# Patient Record
Sex: Female | Born: 1955
Health system: Southern US, Community
[De-identification: ages and names within clinical notes are randomized; demographics above are authoritative.]

## PROBLEM LIST (undated history)

## (undated) DIAGNOSIS — G4730 Sleep apnea, unspecified: Secondary | ICD-10-CM

## (undated) DIAGNOSIS — E240 Pituitary-dependent Cushing's disease: Secondary | ICD-10-CM

## (undated) DIAGNOSIS — I10 Essential (primary) hypertension: Secondary | ICD-10-CM

## (undated) DIAGNOSIS — M1990 Unspecified osteoarthritis, unspecified site: Secondary | ICD-10-CM

## (undated) DIAGNOSIS — M810 Age-related osteoporosis without current pathological fracture: Secondary | ICD-10-CM

## (undated) DIAGNOSIS — E119 Type 2 diabetes mellitus without complications: Secondary | ICD-10-CM

## (undated) DIAGNOSIS — E249 Cushing's syndrome, unspecified: Secondary | ICD-10-CM

## (undated) DIAGNOSIS — D649 Anemia, unspecified: Secondary | ICD-10-CM

## (undated) DIAGNOSIS — E781 Pure hyperglyceridemia: Secondary | ICD-10-CM

## (undated) HISTORY — DX: Essential (primary) hypertension: I10

## (undated) HISTORY — PX: TUBAL LIGATION: SHX77

## (undated) HISTORY — PX: PITUITARY EXCISION: SHX745

## (undated) HISTORY — DX: Type 2 diabetes mellitus without complications: E11.9

## (undated) HISTORY — DX: Sleep apnea, unspecified: G47.30

## (undated) HISTORY — DX: Pure hyperglyceridemia: E78.1

---

## 2001-12-23 ENCOUNTER — Other Ambulatory Visit: Admission: RE | Admit: 2001-12-23 | Discharge: 2001-12-23 | Payer: Self-pay | Admitting: Obstetrics & Gynecology

## 2002-01-29 ENCOUNTER — Ambulatory Visit (HOSPITAL_COMMUNITY): Admission: RE | Admit: 2002-01-29 | Discharge: 2002-01-29 | Payer: Self-pay | Admitting: Obstetrics & Gynecology

## 2003-06-01 ENCOUNTER — Encounter: Payer: Self-pay | Admitting: Family Medicine

## 2003-06-01 ENCOUNTER — Ambulatory Visit (HOSPITAL_COMMUNITY): Admission: RE | Admit: 2003-06-01 | Discharge: 2003-06-01 | Payer: Self-pay | Admitting: Family Medicine

## 2005-01-19 ENCOUNTER — Ambulatory Visit (HOSPITAL_COMMUNITY): Admission: RE | Admit: 2005-01-19 | Discharge: 2005-01-19 | Payer: Self-pay | Admitting: Otolaryngology

## 2005-09-24 DIAGNOSIS — G4730 Sleep apnea, unspecified: Secondary | ICD-10-CM

## 2005-09-24 HISTORY — DX: Sleep apnea, unspecified: G47.30

## 2005-10-25 ENCOUNTER — Ambulatory Visit (HOSPITAL_COMMUNITY): Admission: RE | Admit: 2005-10-25 | Discharge: 2005-10-25 | Payer: Self-pay | Admitting: *Deleted

## 2006-09-24 DIAGNOSIS — E119 Type 2 diabetes mellitus without complications: Secondary | ICD-10-CM

## 2006-09-24 HISTORY — DX: Type 2 diabetes mellitus without complications: E11.9

## 2007-06-18 ENCOUNTER — Ambulatory Visit: Payer: Self-pay | Admitting: Orthopedic Surgery

## 2007-07-02 ENCOUNTER — Encounter (HOSPITAL_COMMUNITY): Admission: RE | Admit: 2007-07-02 | Discharge: 2007-08-01 | Payer: Self-pay | Admitting: Family Medicine

## 2007-07-02 ENCOUNTER — Ambulatory Visit (HOSPITAL_COMMUNITY): Payer: Self-pay | Admitting: Family Medicine

## 2007-09-22 ENCOUNTER — Ambulatory Visit (HOSPITAL_COMMUNITY): Admission: RE | Admit: 2007-09-22 | Discharge: 2007-09-22 | Payer: Self-pay | Admitting: Family Medicine

## 2007-09-23 ENCOUNTER — Encounter (HOSPITAL_COMMUNITY): Admission: RE | Admit: 2007-09-23 | Discharge: 2007-09-24 | Payer: Self-pay | Admitting: Family Medicine

## 2007-09-26 ENCOUNTER — Encounter (HOSPITAL_COMMUNITY): Admission: RE | Admit: 2007-09-26 | Discharge: 2007-10-26 | Payer: Self-pay | Admitting: Family Medicine

## 2007-10-01 ENCOUNTER — Encounter (HOSPITAL_COMMUNITY): Admission: RE | Admit: 2007-10-01 | Discharge: 2007-10-31 | Payer: Self-pay | Admitting: Oncology

## 2007-10-01 ENCOUNTER — Ambulatory Visit (HOSPITAL_COMMUNITY): Payer: Self-pay | Admitting: Family Medicine

## 2007-10-27 ENCOUNTER — Encounter (HOSPITAL_COMMUNITY): Admission: RE | Admit: 2007-10-27 | Discharge: 2007-11-26 | Payer: Self-pay | Admitting: Family Medicine

## 2007-11-28 ENCOUNTER — Encounter (HOSPITAL_COMMUNITY): Admission: RE | Admit: 2007-11-28 | Discharge: 2007-12-28 | Payer: Self-pay | Admitting: Family Medicine

## 2007-12-14 ENCOUNTER — Encounter: Admission: RE | Admit: 2007-12-14 | Discharge: 2007-12-14 | Payer: Self-pay | Admitting: Endocrinology

## 2007-12-30 ENCOUNTER — Encounter (HOSPITAL_COMMUNITY): Admission: RE | Admit: 2007-12-30 | Discharge: 2008-01-29 | Payer: Self-pay | Admitting: Family Medicine

## 2007-12-31 ENCOUNTER — Ambulatory Visit (HOSPITAL_COMMUNITY): Payer: Self-pay | Admitting: Internal Medicine

## 2007-12-31 ENCOUNTER — Encounter (HOSPITAL_COMMUNITY): Admission: RE | Admit: 2007-12-31 | Discharge: 2008-01-30 | Payer: Self-pay | Admitting: Internal Medicine

## 2008-02-26 ENCOUNTER — Inpatient Hospital Stay (HOSPITAL_COMMUNITY): Admission: RE | Admit: 2008-02-26 | Discharge: 2008-03-01 | Payer: Self-pay | Admitting: Neurosurgery

## 2008-02-26 ENCOUNTER — Encounter (INDEPENDENT_AMBULATORY_CARE_PROVIDER_SITE_OTHER): Payer: Self-pay | Admitting: Neurosurgery

## 2008-04-01 ENCOUNTER — Ambulatory Visit (HOSPITAL_COMMUNITY): Payer: Self-pay | Admitting: Internal Medicine

## 2008-04-01 ENCOUNTER — Encounter (HOSPITAL_COMMUNITY): Admission: RE | Admit: 2008-04-01 | Discharge: 2008-05-01 | Payer: Self-pay | Admitting: Internal Medicine

## 2008-06-14 ENCOUNTER — Other Ambulatory Visit: Admission: RE | Admit: 2008-06-14 | Discharge: 2008-06-14 | Payer: Self-pay | Admitting: Obstetrics and Gynecology

## 2008-06-30 ENCOUNTER — Ambulatory Visit (HOSPITAL_COMMUNITY): Admission: RE | Admit: 2008-06-30 | Discharge: 2008-06-30 | Payer: Self-pay | Admitting: Endocrinology

## 2009-02-08 ENCOUNTER — Encounter: Admission: RE | Admit: 2009-02-08 | Discharge: 2009-02-08 | Payer: Self-pay | Admitting: Endocrinology

## 2009-08-01 ENCOUNTER — Encounter (HOSPITAL_COMMUNITY): Admission: RE | Admit: 2009-08-01 | Discharge: 2009-08-31 | Payer: Self-pay | Admitting: Oncology

## 2009-08-01 ENCOUNTER — Ambulatory Visit (HOSPITAL_COMMUNITY): Payer: Self-pay | Admitting: Family Medicine

## 2009-10-07 ENCOUNTER — Other Ambulatory Visit: Admission: RE | Admit: 2009-10-07 | Discharge: 2009-10-07 | Payer: Self-pay | Admitting: Obstetrics & Gynecology

## 2010-03-09 ENCOUNTER — Ambulatory Visit (HOSPITAL_COMMUNITY): Admission: RE | Admit: 2010-03-09 | Discharge: 2010-03-09 | Payer: Self-pay | Admitting: Orthopaedic Surgery

## 2010-12-19 ENCOUNTER — Inpatient Hospital Stay (HOSPITAL_COMMUNITY)
Admission: EM | Admit: 2010-12-19 | Discharge: 2010-12-27 | DRG: 552 | Disposition: A | Discharge status: Home Health | Payer: No Typology Code available for payment source | Attending: Surgery | Admitting: Surgery

## 2010-12-19 ENCOUNTER — Encounter (HOSPITAL_COMMUNITY): Payer: Self-pay | Admitting: Radiology

## 2010-12-19 ENCOUNTER — Emergency Department (HOSPITAL_COMMUNITY): Payer: No Typology Code available for payment source

## 2010-12-19 DIAGNOSIS — S32009A Unspecified fracture of unspecified lumbar vertebra, initial encounter for closed fracture: Principal | ICD-10-CM | POA: Diagnosis present

## 2010-12-19 DIAGNOSIS — E249 Cushing's syndrome, unspecified: Secondary | ICD-10-CM | POA: Diagnosis present

## 2010-12-19 DIAGNOSIS — E039 Hypothyroidism, unspecified: Secondary | ICD-10-CM | POA: Diagnosis present

## 2010-12-19 DIAGNOSIS — K130 Diseases of lips: Secondary | ICD-10-CM | POA: Diagnosis present

## 2010-12-19 DIAGNOSIS — M25469 Effusion, unspecified knee: Secondary | ICD-10-CM | POA: Diagnosis present

## 2010-12-19 HISTORY — DX: Pituitary-dependent Cushing's disease: E24.0

## 2010-12-19 LAB — POCT I-STAT, CHEM 8
BUN: 10 mg/dL (ref 6–23)
Chloride: 104 mEq/L (ref 96–112)
Creatinine, Ser: 1.5 mg/dL — ABNORMAL HIGH (ref 0.4–1.2)
Glucose, Bld: 134 mg/dL — ABNORMAL HIGH (ref 70–99)
Potassium: 3.8 mEq/L (ref 3.5–5.1)
Sodium: 139 mEq/L (ref 135–145)

## 2010-12-19 MED ORDER — IOHEXOL 300 MG/ML  SOLN
100.0000 mL | Freq: Once | INTRAMUSCULAR | Status: AC | PRN
  Administered 2010-12-19: 100 mL via INTRAVENOUS

## 2010-12-20 LAB — GLUCOSE, CAPILLARY
Glucose-Capillary: 115 mg/dL — ABNORMAL HIGH (ref 70–99)
Glucose-Capillary: 119 mg/dL — ABNORMAL HIGH (ref 70–99)

## 2010-12-20 LAB — BASIC METABOLIC PANEL
Calcium: 8.5 mg/dL (ref 8.4–10.5)
Chloride: 108 mEq/L (ref 96–112)
Creatinine, Ser: 1.11 mg/dL (ref 0.4–1.2)
GFR calc non Af Amer: 51 mL/min — ABNORMAL LOW (ref >60)
Glucose, Bld: 140 mg/dL — ABNORMAL HIGH (ref 70–99)

## 2010-12-20 LAB — CBC
MCH: 29.9 pg (ref 26.0–34.0)
RBC: 4.35 MIL/uL (ref 3.87–5.11)

## 2010-12-20 LAB — HEMOGLOBIN A1C: Mean Plasma Glucose: 114 mg/dL (ref <117)

## 2010-12-20 LAB — MRSA PCR SCREENING: MRSA by PCR: POSITIVE — AB

## 2010-12-21 LAB — GLUCOSE, CAPILLARY
Glucose-Capillary: 103 mg/dL — ABNORMAL HIGH (ref 70–99)
Glucose-Capillary: 115 mg/dL — ABNORMAL HIGH (ref 70–99)

## 2010-12-21 LAB — BASIC METABOLIC PANEL
Chloride: 107 mEq/L (ref 96–112)
Creatinine, Ser: 1.01 mg/dL (ref 0.4–1.2)
GFR calc non Af Amer: 56 mL/min — ABNORMAL LOW (ref >60)
Potassium: 4.1 mEq/L (ref 3.5–5.1)
Sodium: 140 mEq/L (ref 135–145)

## 2010-12-21 LAB — CBC
HCT: 38.3 % (ref 36.0–46.0)
MCV: 89.5 fL (ref 78.0–100.0)
RBC: 4.28 MIL/uL (ref 3.87–5.11)

## 2010-12-21 NOTE — Consult Note (Signed)
  NAMECHELISE, HANGER NO.:  [id number]  MEDICAL RECORD NO.:  [id number]           PATIENT TYPE:  I  LOCATION:  3103                         FACILITY:  [facility]  PHYSICIAN:  Hilda Lias, M.D.   DATE OF BIRTH:  [birth year]  DATE OF CONSULTATION:  12/19/2010 DATE OF DISCHARGE:                                CONSULTATION   Ms. Fritcher is a 55 year old female with a history of Cushing disease. She had resection of a pituitary gland in 2009.  Since then, she has had many accidents with many fractures in the lumbar spine.  Lately, she has been taken care of by an orthopedic surgeon because of a pinched nerve. Today, when she was kissing her dog, she fell and landed on her back. She immediately had excruciating pain.  Right now, besides the pain, she denies any sensory change, any weakness, or any problem with her bladder.  She denies any numbness.  She was brought to the emergency room.  CT scan of the abdomen and x-ray of the lumbar spine was obtained and we were called for evaluation.  At the present time, the patient is awake, oriented x3.  She has a moon face.  The blood pressure is 120/85 with a pulse of 88.  Strength; she has normal strength in the upper and lower extremities.  Sensation is normal to light touch and pinprick and proprioception.  Reflexes are symmetrical.  Rectal tone is negative.  She has quite a bit of tenderness in the upper lumbar area.  The lumbar spine x-rays showed fracture of L1 with spondylolisthesis of L4-L5.  The CT scan of the abdomen showed fracture of L1 with posterior displacement of the bone of 6 mm.  CLINICAL IMPRESSION:  Fracture of L1 with a normal neurological examination, incidental L4-L5 spondylolisthesis, history of Cushing disease.  RECOMMENDATIONS:  The patient is going to be admitted by Trauma.  The advise right now is for her to have bed rest and she is going to have lumbothoracic brace for pain control.  She is  fully aware that we are going to monitor her clinically to see how well she does.  There is a possibility that she might require surgery, but we will see how she does.  Nevertheless, there is no need for any surgical intervention acutely.          ______________________________ Hilda Lias, M.D.     EB/MEDQ  D:  12/19/2010  T:  12/20/2010  Job:  [id number]  Electronically Signed by Hilda Lias M.D. on 12/21/2010 12:57:13 PM

## 2010-12-22 LAB — GLUCOSE, CAPILLARY
Glucose-Capillary: 103 mg/dL — ABNORMAL HIGH (ref 70–99)
Glucose-Capillary: 114 mg/dL — ABNORMAL HIGH (ref 70–99)
Glucose-Capillary: 68 mg/dL — ABNORMAL LOW (ref 70–99)

## 2010-12-23 LAB — GLUCOSE, CAPILLARY: Glucose-Capillary: 90 mg/dL (ref 70–99)

## 2010-12-24 ENCOUNTER — Inpatient Hospital Stay (HOSPITAL_COMMUNITY): Payer: No Typology Code available for payment source

## 2010-12-24 LAB — GLUCOSE, CAPILLARY
Glucose-Capillary: 101 mg/dL — ABNORMAL HIGH (ref 70–99)
Glucose-Capillary: 106 mg/dL — ABNORMAL HIGH (ref 70–99)
Glucose-Capillary: 110 mg/dL — ABNORMAL HIGH (ref 70–99)

## 2010-12-25 ENCOUNTER — Inpatient Hospital Stay (HOSPITAL_COMMUNITY): Payer: No Typology Code available for payment source

## 2010-12-25 LAB — GLUCOSE, CAPILLARY: Glucose-Capillary: 112 mg/dL — ABNORMAL HIGH (ref 70–99)

## 2010-12-26 LAB — GLUCOSE, CAPILLARY
Glucose-Capillary: 97 mg/dL (ref 70–99)
Glucose-Capillary: 134 mg/dL — ABNORMAL HIGH (ref 70–99)
Glucose-Capillary: 102 mg/dL — ABNORMAL HIGH (ref 70–99)
Glucose-Capillary: 117 mg/dL — ABNORMAL HIGH (ref 70–99)
Glucose-Capillary: 99 mg/dL (ref 70–99)

## 2010-12-27 LAB — GLUCOSE, CAPILLARY

## 2010-12-27 NOTE — Consult Note (Signed)
  Stacy Jensen, NAZARENO                ACCOUNT NO.:  [id number]  MEDICAL RECORD NO.:  [id number]           PATIENT TYPE:  I  LOCATION:  3019                         FACILITY:  [facility]  PHYSICIAN:  Nelda Severe, MD      DATE OF BIRTH:  [birth year]  DATE OF CONSULTATION:  12/21/2010 DATE OF DISCHARGE:                                CONSULTATION   This patient, seen by me in the past with regards to L4-L5 spondylolisthesis and stenosis, was seen and examined at the request of Dr. Jeral Fruit.   DICTATION ENDED AT THIS POINT.     Nelda Severe, MD     MT/MEDQ  D:  12/21/2010  T:  12/22/2010  Job:  [id number]  Electronically Signed by Nelda Severe MD on 12/27/2010 09:06:40 AM
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
## 2011-02-06 NOTE — Op Note (Signed)
NAMESALLYANN, Stacy Jensen                ACCOUNT NO.:  [id number]   MEDICAL RECORD NO.:  [id number]          PATIENT TYPE:  INP   LOCATION:  3111                         FACILITY:  [facility]   PHYSICIAN:  Danae Orleans. Venetia Maxon, M.D.  DATE OF BIRTH:  March 30, 1956   DATE OF PROCEDURE:  02/26/2008  DATE OF DISCHARGE:                               OPERATIVE REPORT   PREOPERATIVE DIAGNOSIS:  Cushing disease with pituitary adenoma.   POSTOPERATIVE DIAGNOSIS:  Cushing disease with pituitary adenoma.   PROCEDURE:  Transsphenoidal resection of pituitary adenoma.   SURGEON:  Danae Orleans. Venetia Maxon, MD   ASSISTANT:  Coletta Memos, MD   ANESTHESIA:  General endotracheal anesthesia.   ESTIMATED BLOOD LOSS:  Minimal.   COMPLICATIONS:  None.   DISPOSITION:  Recovery.   FINDINGS:  Friable tumor consistent with pituitary adenoma, sent to  home.   INDICATIONS:  [name] is a 55 year old woman with Cushing disease.  She was found to have a 9-mm microadenoma in the posterior pituitary and  it was elected to take her to surgery for transsphenoidal resection of  pituitary adenoma.  It was elected to perform this with Dr. Suzanna Obey  from the ENT to perform exposure.   PROCEDURE:  [name] was brought to the operating room.  Following  satisfactory and uncomplicated induction of general endotracheal  anesthesia and placement of intravenous lines including central line,  the patient was placed in a supine position on the operating table and 3-  pin head fixation, her head was rotated 15 degrees to the right and head  of bed was elevated slightly.  A C-arm fluoroscope was used to identify  the pituitary over the sella.  Dr. Jearld Fenton performed exposure and this  will be dictated separately by him.   Upon exposing the sella, we confirmed the positioning of the exposure  with multiple C-arm images.  The sella was softened and I was able to  broadly uncover it with removing bone with 1-mm Kerrison rongeur without  having to drill through the bone.  The dura was then cauterized and  opened widely in a cruciate fashion.  On opening the dura in the  position anticipated on review the MRI, friable soft material consistent  with a pituitary adenoma was liberated and working within this cavity  with a variety of pituitary curettes, I was able to remove significant  amount of adenomatous tissue.  The gland appeared to be above this  adenoma and was much firmer and pink in color consistent with normal  pituitary gland.  I had Dr. Franky Macho palpate the floor of the sella as  well and we felt at this point that we had removed the tumor.  There  appeared to be pinkish gland deep to the adenoma cavity and I was not  able to liberate any more tumor.  Final radiograph of the C-arm showed  the dissector probe within the sella at the depth I would anticipate it  upon removal of the tumor.  The wound was then irrigated.  A small piece  of bone was  placed overlying the bony  defect and a wedged under the edges of normal  bone.  At this point, Dr. Jearld Fenton performed the closure and I then took  the patient out of head pins and transferred her to the recovery room in  stable satisfactory condition, having tolerated procedure well.      Danae Orleans. Venetia Maxon, M.D.  Electronically Signed     JDS/MEDQ  D:  02/26/2008  T:  02/27/2008  Job:  [id number]

## 2011-02-06 NOTE — Discharge Summary (Signed)
Stacy Jensen, Stacy Jensen                ACCOUNT NO.:  [id number]   MEDICAL RECORD NO.:  [id number]          PATIENT TYPE:  INP   LOCATION:  3002                         FACILITY:  [facility]   PHYSICIAN:  Danae Orleans. Venetia Maxon, M.D.  DATE OF BIRTH:  [birth year]   DATE OF ADMISSION:  02/26/2008  DATE OF DISCHARGE:  03/01/2008                               DISCHARGE SUMMARY   REASON FOR ADMISSION:  1. Cushing syndrome with benign pituitary neoplasm.  2. Hypertension.  3. Type 2 diabetes.   FINAL DIAGNOSES:  1. Cushing syndrome with benign pituitary neoplasm.  2. Hypertension.  3. Type 2 diabetes.   HISTORY OF ILLNESS AND HOSPITAL COURSE:  [name] is a 55 year old  woman with Cushing's disease.  She has plethoric facies, morbid obesity,  striae, centripetal obesity, and hypertension.  She has elevated  cortisol and with non suppression, she was found to have a pituitary  microadenoma.  She was admitted to the hospital on the same-day  basis  and underwent uncomplicated transsphenoidal resection of pituitary  microadenoma.  Postoperatively, she did well.  She did have some mild  hyponatremia with a sodium of 130.  Her approach surgeon was Dr. Jearld Fenton,  and she was instructed to follow up with me in the office about 2 weeks  postoperatively and with Dr. Jearld Fenton one day following discharge to remove  packs and Dr. Talmage Nap for endocrine followup.   DISCHARGE MEDICATIONS:  Include Darvocet, Keflex, and hydrocortisone.      Danae Orleans. Venetia Maxon, M.D.  Electronically Signed     JDS/MEDQ  D:  03/25/2008  T:  03/26/2008  Job:  [id number]

## 2011-02-06 NOTE — Op Note (Signed)
NAMECORABELLE, SPACKMAN NO.:  [id number]   MEDICAL RECORD NO.:  [id number]          PATIENT TYPE:  INP   LOCATION:  3111                         FACILITY:  [facility]   PHYSICIAN:  Suzanna Obey, M.D.       DATE OF BIRTH:  [birth year]   DATE OF PROCEDURE:  DATE OF DISCHARGE:                               OPERATIVE REPORT   PREOPERATIVE DIAGNOSIS:  Cushing disease with pituitary adenoma.   POSTOPERATIVE DIAGNOSIS:  Cushing disease with pituitary adenoma.   SURGICAL PROCEDURES:  Transsphenoidal resection of pituitary tumor.   ANESTHESIA:  General.   ESTIMATED BLOOD LOSS:  Approximately 10 mL.   INDICATIONS:  This is a 55 year old who has had a long history of issues  with Cushing disease which has now resulted in identification of a tumor  and Dr. Venetia Maxon has elected to proceed with the pituitary tumor removal.  The approach through the nose and septum was discussed and risks and  benefits were discussed.  Options were discussed.  All questions were  answered and consent was obtained.   OPERATION:  The patient was taken to the operating room, placed in  supine position.  After adequate general endotracheal tube anesthesia,  the patient was positioned by Dr. Venetia Maxon.  The nose was injected with 1%  lidocaine with 1:100,000 epinephrine in the columella, septum, and floor  of the nose.  A reverse gull wing incision was then identified across  the base of the columella and dissected.  The medial crura on the right  side was dissected free, and the septum was brought off the nasal spine  after dividing the cartilage about 2 cm posterior to the caudal strut.  The septum was elevated.  Floor of nose and right-sided flap was  elevated completely.  The septum was then drawn into the left nasal  cavity to gain exposure to the posterior portion of the septum.  This  was then freed up with the Jansen-Middleton forceps to preserve a  portion of the cartilage and bone for later  reconstruction.  The  dissection was carried back to the sphenoid face where the septum was  taken off the sphenoid face with the [facility] forceps which immediately  opened into the sphenoid sinus.  It was opened widely with the bone  curettes, and the sphenoid septum was deviated to the right.  This was  taken down to allow exposure to the midline.  The pituitary fossa was  then opened by Dr. Venetia Maxon to be dictated in a separate operative port.  Once completed, the flaps were laid back down into the anatomic position  of the septum, and the hemi-transfix incision that was part of the  elevation of the columella which had been elevated from the reverse gull  wing incision taking the medial crura off and dissected superiorly right  along the face of the septum.  This was positioned back.  The base of  the medial crura was sutured with a 4-0 chromic and the subcutaneous  closed with interrupted 4-0 chromic.  The quilting stitch of 4-0 plain  gut was placed  through the septum to bring the flaps securely together.  The skin with the reverse gull wing incision was closed  with interrupted 4-0 nylon.  Telfa rolls soaked in bacitracin was placed  into the nose bilaterally and then secured with a 3-0 nylon anteriorly.  Oral cavity and oropharynx were suctioned of all blood and debris.  The  patient was awakened and brought to the recovery in stable condition.  Counts correct.           ______________________________  Suzanna Obey, M.D.     JB/MEDQ  D:  02/26/2008  T:  02/27/2008  Job:  [id number]   cc:   Georgia Duff

## 2011-02-09 NOTE — Op Note (Signed)
[facility]  Patient:    Stacy Jensen, Stacy Jensen Visit Number: [id number] MRN: [id number]          Service Type: DSU Location: DAY Attending Physician:  Lazaro Arms Dictated by:   Duane Lope, M.D. Proc. Date: 01/29/02 Admit Date:  01/29/2002 Discharge Date: 01/29/2002   CC:         Ladona Horns. Mariel Sleet, M.D.   Operative Report  PREOPERATIVE DIAGNOSIS:  Unresponsive menometrorrhagia.  POSTOPERATIVE DIAGNOSIS:  Unresponsive menometrorrhagia.  OPERATION/PROCEDURE:  Hysteroscopy dilatation and curettage with a thermal balloon, endometrial ablation.  SURGEON:  Duane Lope, M.D.  ANESTHESIA:  General with laryngeal mask airway.  FINDINGS:  The patient had just a lot of tissue in the endometrial cavity.  No polyps, no myomas, no areas of concern of carcinoma.  The endometrial cavity was relatively big.  It took 23 cc of D-5-W to distended the uterine cavity.  DESCRIPTION OF PROCEDURE:  The patient was taken to the operating room and placed in the supine position where she underwent general anesthetic by laryngeal mask airway.  She was then placed in the dorsolithotomy position, prepped and draped in the usual sterile fashion.  Her bladder was drained.  A sterile speculum was placed.  Her anterior cervical lip was grasped with the single-tooth tenaculum.  Sensorcaine plain, 0.5% was placed as a paracervical block bilaterally, 20 cc total.  The cervix was then dilated to allow the passage of a 5-mm hysteroscope. Normal saline was used as the distending media and the above-noted findings were seen.  The hysteroscope was removed and the cervix was dilated serially to allow passage of a large curette.  A vigorous endometrial curettage was performed getting good uterine cry in all areas.  A large amount of tissue was returned and sent to pathology for evaluation.  Once a good uterine cry was obtained in all areas the endometrial ablation was performed.  The  catheter was primed, it was taken to a minus 150 pressure.  It was then placed in the uterine cavity and 22 cc of D-5-W was required to maintain an adequate pressure.  It was then heated to 87 degree Celsius for a total period of 8 minutes.  The total therapy time was 9 minutes 45 seconds.  The fluid was then allowed to cool down and the fluid was removed from balloon.  All the fluid was indeed accounted for; therefore, there was no balloon rupture.  It was removed from the endometrial cavity.  The speculum and the single-tooth tenaculum were removed.   The patient was awakened from anesthesia and taken to recovery in good stable condition.  All counts were correct x3.  The patient received Toradol preoperatively and, of course, the Sensorcaine 1/2% paracervical block intraoperatively. Dictated by:   Duane Lope, M.D. Attending Physician:  Lazaro Arms DD:  01/29/02 TD:  01/31/02 Job: [id number]

## 2011-06-21 LAB — BASIC METABOLIC PANEL WITH GFR
BUN: 15
CO2: 27
Calcium: 9.8
Chloride: 94 — ABNORMAL LOW
Creatinine, Ser: 0.68
GFR calc non Af Amer: >60
Glucose, Bld: 117 — ABNORMAL HIGH
Potassium: 3.8
Sodium: 131 — ABNORMAL LOW

## 2011-06-21 LAB — URINALYSIS, DIPSTICK ONLY
Glucose, UA: NEGATIVE
Hgb urine dipstick: NEGATIVE
Hgb urine dipstick: NEGATIVE
Leukocytes, UA: NEGATIVE
Nitrite: NEGATIVE
Protein, ur: NEGATIVE
Specific Gravity, Urine: 1.015
Specific Gravity, Urine: 1.015
Urobilinogen, UA: 0.2
Urobilinogen, UA: 0.2
pH: 5.5

## 2011-06-21 LAB — CBC
HCT: 40.2
Hemoglobin: 14
MCHC: 34.8
MCV: 95.5
Platelets: 280
RBC: 4.21
RDW: 13.9
WBC: 9.8

## 2011-06-21 LAB — BASIC METABOLIC PANEL
BUN: 11
CO2: 28
Calcium: 8.7
Chloride: 87 — ABNORMAL LOW
Chloride: 92 — ABNORMAL LOW
GFR calc Af Amer: >60
GFR calc Af Amer: >60
GFR calc non Af Amer: >60
GFR calc non Af Amer: >60
GFR calc non Af Amer: >60
Glucose, Bld: 128 — ABNORMAL HIGH
Potassium: 3.3 — ABNORMAL LOW
Potassium: 3.8
Potassium: 4.1
Potassium: 4.3
Sodium: 127 — ABNORMAL LOW
Sodium: 130 — ABNORMAL LOW
Sodium: 132 — ABNORMAL LOW
Sodium: 136

## 2011-06-21 LAB — URINALYSIS, ROUTINE W REFLEX MICROSCOPIC
Bilirubin Urine: NEGATIVE
Bilirubin Urine: NEGATIVE
Glucose, UA: NEGATIVE
Hgb urine dipstick: NEGATIVE
Hgb urine dipstick: NEGATIVE
Ketones, ur: NEGATIVE
Ketones, ur: NEGATIVE
Nitrite: NEGATIVE
Nitrite: NEGATIVE
Protein, ur: NEGATIVE
Protein, ur: NEGATIVE
Specific Gravity, Urine: 1.016
Urobilinogen, UA: 0.2
Urobilinogen, UA: 0.2
pH: 5.5

## 2011-06-21 LAB — URINE MICROSCOPIC-ADD ON

## 2011-06-25 LAB — BASIC METABOLIC PANEL
Chloride: 101
GFR calc non Af Amer: 27 — ABNORMAL LOW
Glucose, Bld: 141 — ABNORMAL HIGH
Potassium: 3.4 — ABNORMAL LOW
Sodium: 130 — ABNORMAL LOW

## 2013-01-21 ENCOUNTER — Telehealth: Payer: Self-pay | Admitting: Family Medicine

## 2013-01-21 NOTE — Telephone Encounter (Signed)
Patient needs BW paperwork °

## 2013-01-22 ENCOUNTER — Telehealth: Payer: Self-pay | Admitting: *Deleted

## 2013-01-22 ENCOUNTER — Other Ambulatory Visit: Payer: Self-pay | Admitting: Family Medicine

## 2013-01-22 ENCOUNTER — Other Ambulatory Visit: Payer: Self-pay | Admitting: *Deleted

## 2013-01-22 DIAGNOSIS — Z0000 Encounter for general adult medical examination without abnormal findings: Secondary | ICD-10-CM

## 2013-01-22 DIAGNOSIS — E119 Type 2 diabetes mellitus without complications: Secondary | ICD-10-CM

## 2013-01-22 NOTE — Telephone Encounter (Signed)
Lipid and liver profile.also glucose. I don't really feel other testing necessary currently. If patient desires additional testing we can.

## 2013-01-22 NOTE — Telephone Encounter (Signed)
Message on vm of lab work papers sent in to [employer]

## 2013-01-29 ENCOUNTER — Other Ambulatory Visit: Payer: Self-pay | Admitting: *Deleted

## 2013-01-29 DIAGNOSIS — E119 Type 2 diabetes mellitus without complications: Secondary | ICD-10-CM

## 2013-01-29 DIAGNOSIS — Z0000 Encounter for general adult medical examination without abnormal findings: Secondary | ICD-10-CM

## 2013-01-29 LAB — LIPID PANEL
HDL: 34 mg/dL — ABNORMAL LOW (ref >39)
Total CHOL/HDL Ratio: 8.4 Ratio

## 2013-01-29 LAB — HEPATIC FUNCTION PANEL
ALT: 11 U/L (ref 0–35)
AST: 16 U/L (ref 0–37)
Albumin: 4.4 g/dL (ref 3.5–5.2)
Total Bilirubin: 0.7 mg/dL (ref 0.3–1.2)

## 2013-01-29 LAB — GLUCOSE, RANDOM: Glucose, Bld: 92 mg/dL (ref 70–99)

## 2013-02-03 ENCOUNTER — Encounter: Payer: Self-pay | Admitting: *Deleted

## 2013-02-05 ENCOUNTER — Encounter: Payer: Self-pay | Admitting: Family Medicine

## 2013-02-05 ENCOUNTER — Ambulatory Visit (INDEPENDENT_AMBULATORY_CARE_PROVIDER_SITE_OTHER): Payer: BC Managed Care – PPO | Admitting: Family Medicine

## 2013-02-05 VITALS — BP 170/90 | Wt 157.0 lb

## 2013-02-05 DIAGNOSIS — M79672 Pain in left foot: Secondary | ICD-10-CM

## 2013-02-05 DIAGNOSIS — E249 Cushing's syndrome, unspecified: Secondary | ICD-10-CM

## 2013-02-05 DIAGNOSIS — M79609 Pain in unspecified limb: Secondary | ICD-10-CM

## 2013-02-05 DIAGNOSIS — R7309 Other abnormal glucose: Secondary | ICD-10-CM

## 2013-02-05 DIAGNOSIS — I10 Essential (primary) hypertension: Secondary | ICD-10-CM | POA: Insufficient documentation

## 2013-02-05 DIAGNOSIS — R739 Hyperglycemia, unspecified: Secondary | ICD-10-CM

## 2013-02-05 DIAGNOSIS — E781 Pure hyperglyceridemia: Secondary | ICD-10-CM | POA: Insufficient documentation

## 2013-02-05 DIAGNOSIS — E785 Hyperlipidemia, unspecified: Secondary | ICD-10-CM | POA: Insufficient documentation

## 2013-02-05 MED ORDER — LISINOPRIL 2.5 MG PO TABS: 2.5000 mg | ORAL_TABLET | Freq: Every day | ORAL | Status: DC

## 2013-02-05 MED ORDER — LEVOTHYROXINE SODIUM 75 MCG PO TABS: ORAL_TABLET | ORAL | Status: DC

## 2013-02-05 NOTE — Progress Notes (Signed)
Subjective:    Patient ID: Stacy Jensen, female    DOB: 1956-01-15, 57 y.o.   MRN: [id number]  Hyperlipidemia This is a chronic problem. The current episode started more than 1 year ago. The problem is uncontrolled. Recent lipid tests were reviewed and are high. Exacerbating diseases include hypothyroidism and obesity. Factors aggravating her hyperlipidemia include fatty foods. Pertinent negatives include no chest pain, focal sensory loss, focal weakness, leg pain or shortness of breath. She is currently on no antihyperlipidemic treatment (patient has tried cholesterol medicines in the past and stated that it caused severe arthralgias she does not want to use a statin.). The current treatment provides no improvement of lipids. There are no compliance problems.  Risk factors for coronary artery disease include hypertension and obesity.      Review of Systems  Constitutional: Negative for activity change, appetite change and fatigue.  HENT: Negative for neck pain and ear discharge.        She complains of difficulty hearing out of the right ear she relates it back to her surgery as well as Cushing's disease.  Eyes: Negative for discharge.  Respiratory: Negative for cough, chest tightness and shortness of breath.   Cardiovascular: Negative for chest pain.  Gastrointestinal: Negative for vomiting and abdominal pain.  Endocrine: Negative for cold intolerance, heat intolerance and polydipsia.  Allergic/Immunologic: Negative for immunocompromised state.  Neurological: Negative for focal weakness, weakness and headaches.  Psychiatric/Behavioral: Negative for behavioral problems and agitation.       Objective:   Physical Exam  Vitals reviewed. Constitutional: She is oriented to person, place, and time. She appears well-developed and well-nourished.  HENT:  Head: Normocephalic.  Right Ear: External ear normal.  Left Ear: External ear normal.  Some wax in the right ear  Eyes: Right eye  exhibits no discharge. No scleral icterus.  Neck: Normal range of motion. No thyromegaly present.  Cardiovascular: Normal rate, regular rhythm, normal heart sounds and intact distal pulses.   No murmur heard. Pulmonary/Chest: Effort normal and breath sounds normal. No respiratory distress. She has no wheezes.  Abdominal: She exhibits no distension.  Musculoskeletal: Normal range of motion. She exhibits no edema and no tenderness.  She does have what appears to be some arthritic changes in the second toe on the left foot she relates this to back when she had a fracture. This was during her Cushing's disease.  Lymphadenopathy:    She has no cervical adenopathy.  Neurological: She is alert and oriented to person, place, and time. She exhibits normal muscle tone.  Skin: Skin is warm and dry.  Psychiatric: She has a normal mood and affect. Her behavior is normal.          Assessment & Plan:  #1 history hyperglycemia-I checked hemoglobin A1c it was normal at 5.3 no sign of diabetes. We also checked up because her triglycerides were severely elevated.She was counseled to maintain a low starch diet and regular activity #2 Hypertriglycerides-I. Told the patient is very concerned about this I recommended medication. Also told her that her LDL was significantly elevated on the previous blood draw as well. Patient does not want to be on any cholesterol medicines. She states she knows how she feels when she takes them and she does not want to take them. I didn't let her know that this puts her at increased risk of heart disease and strokes she understands that she chooses to increase fish oil to twice a day. We will look at his  blood work again in the fall. #3 hypertension-I. Am concerned about her blood pressure is higher than what it needs to be. Patient does not want to be on any medications. I told her it puts her at increased risk of stroke and heart attacks. She understands this. She consents to using  a low-dose lisinopril 2.5 mg she will take this on a regular basis I will recheck her again in September or October. #4 left foot pain discomfort-I. Believe she has traumatic arthritis she would like to see podiatrist for this we will help set her up. #5 hearing loss right ear-patient is interested in in this checked out she will call the ENT whom she has seen before hopefully they will give her an appointment if not she will call us for referral. #6-history of Cushing syndrome patient on hydrocortisone. I told her it's a good idea for her to see endocrinology at least once yearly. I believe patient is somewhat concerned about the higher cost but she consents to allowing [place] to set her back up with her endocrinologist who she liked. Dr. Lurene Shadow

## 2013-02-05 NOTE — Patient Instructions (Signed)
We will set up appt with Dr.Ballen and Dr. Nolen Mu and let you know Follow up in Oct Call sooner if problems

## 2013-03-05 ENCOUNTER — Ambulatory Visit (INDEPENDENT_AMBULATORY_CARE_PROVIDER_SITE_OTHER): Payer: BC Managed Care – PPO | Admitting: Family Medicine

## 2013-03-05 ENCOUNTER — Encounter: Payer: Self-pay | Admitting: Family Medicine

## 2013-03-05 VITALS — BP 120/80 | Temp 98.6°F | Ht 60.5 in | Wt 155.1 lb

## 2013-03-05 DIAGNOSIS — J209 Acute bronchitis, unspecified: Secondary | ICD-10-CM

## 2013-03-05 MED ORDER — MINOCYCLINE HCL 100 MG PO CAPS: 100.0000 mg | ORAL_CAPSULE | Freq: Two times a day (BID) | ORAL | Status: AC

## 2013-03-05 MED ORDER — FLUTICASONE PROPIONATE 50 MCG/ACT NA SUSP: 1.0000 | Freq: Every day | NASAL | Status: DC

## 2013-03-05 NOTE — Progress Notes (Signed)
  Subjective:    Patient ID: Stacy Jensen, female    DOB: December 02, 1955, 57 y.o.   MRN: [id number]  Cough This is a new problem. The current episode started 1 to 4 weeks ago. The problem has been gradually worsening. The problem occurs every few minutes. The cough is productive of sputum. Associated symptoms include chest pain. Pertinent negatives include no chills. Nothing aggravates the symptoms. She has tried nothing (tried allergy pill) for the symptoms. The treatment provided mild relief. There is no history of asthma.   Also developed infxn in cuticles. Hx of MRSA. Sore and tender   Review of Systems  Constitutional: Negative for chills.  Respiratory: Positive for cough.   Cardiovascular: Positive for chest pain.       Objective:   Physical Exam alert mild malaise. Vitals reviewed. HEENT moderate nasal congestion. Lungs rare rhonchi heart irregular rate and rhythm. 2 fingers. Ongoing cellulitis 1 with slight discharge       Assessment & Plan:  Impression 1 acute bronchitis. #2 early cellulitis plan minocycline twice a day 10 days. Symptomatic care discussed. Flonase 2 sprays each nostril daily. Symptomatic care discussed.

## 2013-07-30 ENCOUNTER — Other Ambulatory Visit: Payer: Self-pay | Admitting: Family Medicine

## 2013-08-10 ENCOUNTER — Other Ambulatory Visit: Payer: Self-pay | Admitting: *Deleted

## 2013-08-10 ENCOUNTER — Other Ambulatory Visit: Payer: Self-pay | Admitting: Family Medicine

## 2013-08-10 MED ORDER — OXYBUTYNIN CHLORIDE 5 MG PO TABS: 5.0000 mg | ORAL_TABLET | Freq: Two times a day (BID) | ORAL | Status: DC

## 2013-08-10 MED ORDER — HYDROCORTISONE 10 MG PO TABS: 10.0000 mg | ORAL_TABLET | Freq: Every day | ORAL | Status: DC

## 2013-08-10 NOTE — Telephone Encounter (Signed)
Patient needs Rx for hydrocort to [facility] in [place]

## 2013-08-10 NOTE — Telephone Encounter (Signed)
Medication was sent in to pharmacy. Left message on voicemail notifying patient.

## 2013-08-13 ENCOUNTER — Telehealth: Payer: Self-pay | Admitting: Family Medicine

## 2013-08-13 ENCOUNTER — Other Ambulatory Visit: Payer: Self-pay | Admitting: *Deleted

## 2013-08-13 NOTE — Telephone Encounter (Signed)
Spoke with patient and she states she is taking 2 pills in the morning and 1 in the evening. She will run out of her medication by her appt here on Dec. 12

## 2013-08-13 NOTE — Telephone Encounter (Signed)
hydrocortisone (CORTEF) 10 MG tablet  Pt was supposed to get a refill on this med for TID, not QD  Can we correct this in please   Laurice Record   ( see note from 07/2012 in chart for TID )

## 2013-08-16 NOTE — Telephone Encounter (Signed)
Please correct this and call in new script 30 day and 2 refills

## 2013-08-17 ENCOUNTER — Telehealth: Payer: Self-pay | Admitting: Family Medicine

## 2013-08-17 DIAGNOSIS — E782 Mixed hyperlipidemia: Secondary | ICD-10-CM

## 2013-08-17 DIAGNOSIS — R5381 Other malaise: Secondary | ICD-10-CM

## 2013-08-17 MED ORDER — HYDROCORTISONE 10 MG PO TABS: ORAL_TABLET | ORAL | Status: DC

## 2013-08-17 NOTE — Telephone Encounter (Signed)
Patient dropped off Blood Work results from [facility].  Wants Dr. Lorin Picket to take a look at the results and see if she needs to have additional testing completed prior to her appointment on 09/04/2013.  Please call patient to discuss

## 2013-08-17 NOTE — Telephone Encounter (Signed)
Rx sent electronically to [facility].  Patient notified. 

## 2013-08-18 NOTE — Telephone Encounter (Signed)
Blood work orders placed in EPIC. Patient notified. 

## 2013-08-18 NOTE — Telephone Encounter (Signed)
Metabolic 7, lipid profile are necessary. Also vitamin D level. Thank you

## 2013-08-27 LAB — LIPID PANEL
Cholesterol: 315 mg/dL — ABNORMAL HIGH (ref 0–200)
HDL: 34 mg/dL — ABNORMAL LOW (ref >39)
Total CHOL/HDL Ratio: 9.3 Ratio

## 2013-08-27 LAB — BASIC METABOLIC PANEL
CO2: 29 mEq/L (ref 19–32)
Calcium: 9.8 mg/dL (ref 8.4–10.5)
Potassium: 3.8 mEq/L (ref 3.5–5.3)
Sodium: 141 mEq/L (ref 135–145)

## 2013-08-28 LAB — VITAMIN D 25 HYDROXY (VIT D DEFICIENCY, FRACTURES): Vit D, 25-Hydroxy: 56 ng/mL (ref 30–89)

## 2013-09-04 ENCOUNTER — Ambulatory Visit (INDEPENDENT_AMBULATORY_CARE_PROVIDER_SITE_OTHER): Payer: BC Managed Care – PPO | Admitting: Family Medicine

## 2013-09-04 ENCOUNTER — Encounter: Payer: Self-pay | Admitting: Family Medicine

## 2013-09-04 VITALS — BP 152/108 | Ht 61.0 in | Wt 163.0 lb

## 2013-09-04 DIAGNOSIS — Z23 Encounter for immunization: Secondary | ICD-10-CM

## 2013-09-04 DIAGNOSIS — E781 Pure hyperglyceridemia: Secondary | ICD-10-CM

## 2013-09-04 DIAGNOSIS — I10 Essential (primary) hypertension: Secondary | ICD-10-CM

## 2013-09-04 DIAGNOSIS — N318 Other neuromuscular dysfunction of bladder: Secondary | ICD-10-CM

## 2013-09-04 DIAGNOSIS — E785 Hyperlipidemia, unspecified: Secondary | ICD-10-CM

## 2013-09-04 DIAGNOSIS — E039 Hypothyroidism, unspecified: Secondary | ICD-10-CM | POA: Insufficient documentation

## 2013-09-04 DIAGNOSIS — N3281 Overactive bladder: Secondary | ICD-10-CM | POA: Insufficient documentation

## 2013-09-04 DIAGNOSIS — E249 Cushing's syndrome, unspecified: Secondary | ICD-10-CM

## 2013-09-04 MED ORDER — LISINOPRIL 5 MG PO TABS: 5.0000 mg | ORAL_TABLET | Freq: Every day | ORAL | Status: DC

## 2013-09-04 MED ORDER — FENOFIBRATE 160 MG PO TABS: 160.0000 mg | ORAL_TABLET | Freq: Every day | ORAL | Status: DC

## 2013-09-04 MED ORDER — FLUTICASONE PROPIONATE 50 MCG/ACT NA SUSP: 1.0000 | Freq: Every day | NASAL | Status: DC

## 2013-09-04 MED ORDER — OXYBUTYNIN CHLORIDE 5 MG PO TABS: 5.0000 mg | ORAL_TABLET | Freq: Three times a day (TID) | ORAL | Status: DC

## 2013-09-04 MED ORDER — HYDROCORTISONE 10 MG PO TABS: ORAL_TABLET | ORAL | Status: DC

## 2013-09-04 NOTE — Progress Notes (Signed)
   Subjective:    Patient ID: Stacy Jensen, female    DOB: December 30, 1955, 57 y.o.   MRN: [id number]  HPIHere for a med check. Follow up on bloodwork.   Concerns about taking fish oil. Patient was advised to stay one per day it is 1400 mg.  Weight gain since hydrocortisone has been increased to two qam and one in the evening. Patient was advised to watch diet and stay physically active. She follows with endocrinology on yearly basis.  Taking med for bladder control. Was to know if med can be increased. Patient is asked to increase her medication it would be fine to try 3 per day  Pneumonia vaccine.   Zostavax Rx. PMH benign  PMH hyperlipidemia HTN hypothyroidism and  Review of Systems Denies chest tightness pressure pain vomiting diarrhea denies headaches unilateral numbness weakness    Objective:   Physical Exam Lungs clear hearts regular abdomen soft extremities no edema skin warm dry blood pressure is elevated       Assessment & Plan:  #1 HTN subpar control watch diet closely stay physically active try to bring weight down increase lisinopril use 5 mg daily. Recheck blood pressure in 3 months. Regular walking check blood pressure at home if continued elevated call us and we can adjust medication followup sooner if any problems  #2 hypertriglyceridemia-the importance of watching diet was discussed as well as going ahead with fenofibrate 160 mg 1 every day recheck lab work in the spring time  #3 Cushing's syndrome she follows with Dr.Ballen once a year and just recently had lab work results are in the chart-electronic  #4 overactive bladder may use medication 3 times daily

## 2013-11-03 ENCOUNTER — Ambulatory Visit (INDEPENDENT_AMBULATORY_CARE_PROVIDER_SITE_OTHER): Payer: BC Managed Care – PPO | Admitting: Family Medicine

## 2013-11-03 ENCOUNTER — Encounter: Payer: Self-pay | Admitting: Family Medicine

## 2013-11-03 VITALS — BP 138/88 | Temp 98.4°F | Ht 60.5 in | Wt 160.0 lb

## 2013-11-03 DIAGNOSIS — R300 Dysuria: Secondary | ICD-10-CM

## 2013-11-03 DIAGNOSIS — N939 Abnormal uterine and vaginal bleeding, unspecified: Secondary | ICD-10-CM

## 2013-11-03 DIAGNOSIS — N898 Other specified noninflammatory disorders of vagina: Secondary | ICD-10-CM

## 2013-11-03 LAB — POCT URINALYSIS DIPSTICK
Blood, UA: 250
Spec Grav, UA: <=1.005
pH, UA: 7

## 2013-11-03 MED ORDER — CIPROFLOXACIN HCL 500 MG PO TABS: 500.0000 mg | ORAL_TABLET | Freq: Two times a day (BID) | ORAL | Status: DC

## 2013-11-03 NOTE — Progress Notes (Signed)
   Subjective:    Patient ID: Stacy Jensen, female    DOB: 1956-06-07, 58 y.o.   MRN: [id number]  [facility] frequent urination for a couple of day. She relates some dysuria relate some urinary free to see denies high fever chills nausea vomiting diarrhea.  Noticied vaginal bleeding yesterday. A little bright red blood when wiping. She had ablation in the past. Has never had any bleeding since then.    Review of Systems She does relate vaginal bleeding she also states dysuria she relates some urinary free to see denies flank pain denies high fever chills nausea vomiting diarrhea shortness of breath    Objective:   Physical Exam Lungs are clear hearts regular flanks nontender abdomen soft subjective lower abdominal discomfort with palpation       Assessment & Plan:  UTI- Cipro 500 bid, warnings discussed she will take this over the course of the next 10 days if high fevers or if worse followup  Vag Bleeding- to see Dr Elonda Husky, will need u/s and possible endometrial Biopsy, she is postmenopausal. She's had an ablation in the past. It is concerning that she may be developing a possibility of endometrial cancer she will need ultrasound and possible biopsy. Will refer to gynecology for further workup. Patient was told the importance of doing this. Gynecologist office was called patient will drop by their to help scheduled appointment.

## 2013-11-05 LAB — URINE CULTURE: Colony Count: >=100000

## 2013-11-06 ENCOUNTER — Other Ambulatory Visit: Payer: Self-pay

## 2013-11-06 MED ORDER — CEFPROZIL 500 MG PO TABS: 500.0000 mg | ORAL_TABLET | Freq: Two times a day (BID) | ORAL | Status: DC

## 2013-11-12 ENCOUNTER — Encounter: Payer: Self-pay | Admitting: Obstetrics & Gynecology

## 2013-11-12 ENCOUNTER — Ambulatory Visit (INDEPENDENT_AMBULATORY_CARE_PROVIDER_SITE_OTHER): Payer: BC Managed Care – PPO | Admitting: Obstetrics & Gynecology

## 2013-11-12 VITALS — BP 160/100 | Ht 60.0 in | Wt 159.0 lb

## 2013-11-12 DIAGNOSIS — N950 Postmenopausal bleeding: Secondary | ICD-10-CM

## 2013-11-12 NOTE — Progress Notes (Signed)
Patient ID: Stacy Jensen, female   DOB: [birth year], 58 y.o.   MRN: [id number] Pt has not had any vaginal bleeding in many many years Had about 5 days total of vaginal spotting which she said was definitely coming from the vagina  Exam Normal no masses or lesions grade II anterior prolapse  Needs sonogram and follow up based on that  Past Medical History  Diagnosis Date  . Pituitary Cushing syndrome     tumor removed  . Hypertension   . Sleep apnea 2007  . Hypertriglyceridemia   . Type 2 diabetes mellitus 2008    Past Surgical History  Procedure Laterality Date  . Tubal ligation      OB History   Grav Para Term Preterm Abortions TAB SAB Ect Mult Living                  Allergies  Allergen Reactions  . Codeine     sick    History   Social History  . Marital Status: Married    Spouse Name: N/A    Number of Children: N/A  . Years of Education: N/A   Social History Main Topics  . Smoking status: Never Smoker   . Smokeless tobacco: None  . Alcohol Use: None  . Drug Use: None  . Sexual Activity: None   Other Topics Concern  . None   Social History Narrative  . None    Family History  Problem Relation Age of Onset  . Hypertension Mother   . Hypertension Father   . Heart attack Father

## 2013-11-23 ENCOUNTER — Encounter: Payer: Self-pay | Admitting: Obstetrics & Gynecology

## 2013-11-23 ENCOUNTER — Other Ambulatory Visit: Payer: Self-pay | Admitting: Obstetrics & Gynecology

## 2013-11-23 ENCOUNTER — Ambulatory Visit (INDEPENDENT_AMBULATORY_CARE_PROVIDER_SITE_OTHER): Payer: BC Managed Care – PPO | Admitting: Obstetrics & Gynecology

## 2013-11-23 ENCOUNTER — Other Ambulatory Visit: Payer: Self-pay | Admitting: *Deleted

## 2013-11-23 ENCOUNTER — Ambulatory Visit (INDEPENDENT_AMBULATORY_CARE_PROVIDER_SITE_OTHER): Payer: BC Managed Care – PPO

## 2013-11-23 ENCOUNTER — Telehealth: Payer: Self-pay | Admitting: Family Medicine

## 2013-11-23 VITALS — BP 140/90 | Wt 158.0 lb

## 2013-11-23 DIAGNOSIS — E039 Hypothyroidism, unspecified: Secondary | ICD-10-CM

## 2013-11-23 DIAGNOSIS — E785 Hyperlipidemia, unspecified: Secondary | ICD-10-CM

## 2013-11-23 DIAGNOSIS — N950 Postmenopausal bleeding: Secondary | ICD-10-CM

## 2013-11-23 NOTE — Telephone Encounter (Signed)
On 12/04: Lip, Met7, Vit D

## 2013-11-23 NOTE — Telephone Encounter (Signed)
Pt would like bw orders sent please

## 2013-11-23 NOTE — Telephone Encounter (Signed)
TSH, lipid profile

## 2013-11-23 NOTE — Progress Notes (Signed)
Patient ID: Stacy Jensen, female   DOB: [birth year], 58 y.o.   MRN: [id number] US Transvaginal Non-ob  11/23/2013   GYNECOLOGIC SONOGRAM   Stacy Jensen is a 58 y.o. for a pelvic sonogram for post menopausal  bleeding x 1 episode.  Uterus                      5.8 x 3.9 x 2.7 cm, anteverted no myometrial  masses noted   Endometrium          2.2 mm, symmetrical,   Right ovary             1.2 x 0.8 x 0.6 cm,   Left ovary                1.2 x 1.2 x 1.2 cm,   No free fluid or adnexal masses noted   Technician Comments:  Anteverted uterus noted, Endom=2.55mm, bilateral adnexa/ovaries appears  wnl, no free fluid or adnexal masses noted within pelvis   Lazarus Gowda 11/23/2013 10:31 AM  Normal endometrium, very thin, no indication for endometrial sampling  Dorothy Polhemus H 11/23/2013 11:18 AM      No more bleeding, was on antibiotic, endometrium is very thin does not need sampling  Follow up if needed por if further problems  Past Medical History  Diagnosis Date  . Pituitary Cushing syndrome     tumor removed  . Hypertension   . Sleep apnea 2007  . Hypertriglyceridemia   . Type 2 diabetes mellitus 2008    Past Surgical History  Procedure Laterality Date  . Tubal ligation      OB History   Grav Para Term Preterm Abortions TAB SAB Ect Mult Living                  Allergies  Allergen Reactions  . Codeine     sick    History   Social History  . Marital Status: Married    Spouse Name: N/A    Number of Children: N/A  . Years of Education: N/A   Social History Main Topics  . Smoking status: Never Smoker   . Smokeless tobacco: None  . Alcohol Use: None  . Drug Use: None  . Sexual Activity: None   Other Topics Concern  . None   Social History Narrative  . None    Family History  Problem Relation Age of Onset  . Hypertension Mother   . Hypertension Father   . Heart attack Father

## 2013-11-23 NOTE — Telephone Encounter (Signed)
Notified patient via VM stating I placed the orders for her BW.

## 2013-11-24 LAB — LIPID PANEL
CHOL/HDL RATIO: 5 ratio
CHOLESTEROL: 247 mg/dL — AB (ref 0–200)
HDL: 49 mg/dL (ref >39)
LDL Cholesterol: 164 mg/dL — ABNORMAL HIGH (ref 0–99)
Triglycerides: 172 mg/dL — ABNORMAL HIGH (ref <150)
VLDL: 34 mg/dL (ref 0–40)

## 2013-11-24 LAB — TSH: TSH: 1.188 u[IU]/mL (ref 0.350–4.500)

## 2013-12-02 ENCOUNTER — Telehealth: Payer: Self-pay | Admitting: Family Medicine

## 2013-12-02 NOTE — Telephone Encounter (Signed)
[employer] of cardiology when looking at her risk factors she weighs in at approximately 6% risk which is less than the threshold. She will recheck labs including a lipid profile, in the fall. She may end up needing be on medicine depending on the results. Patient was spoken with.

## 2013-12-02 NOTE — Telephone Encounter (Signed)
Patient calling to get the results to her latest blood work.

## 2013-12-10 ENCOUNTER — Encounter: Payer: Self-pay | Admitting: Obstetrics & Gynecology

## 2014-01-20 ENCOUNTER — Ambulatory Visit (INDEPENDENT_AMBULATORY_CARE_PROVIDER_SITE_OTHER): Payer: BC Managed Care – PPO | Admitting: Family Medicine

## 2014-01-20 ENCOUNTER — Encounter: Payer: Self-pay | Admitting: Family Medicine

## 2014-01-20 VITALS — BP 130/82 | Ht 60.5 in | Wt 160.0 lb

## 2014-01-20 DIAGNOSIS — N390 Urinary tract infection, site not specified: Secondary | ICD-10-CM

## 2014-01-20 DIAGNOSIS — R300 Dysuria: Secondary | ICD-10-CM

## 2014-01-20 MED ORDER — CEFPROZIL 500 MG PO TABS: 500.0000 mg | ORAL_TABLET | Freq: Two times a day (BID) | ORAL | Status: DC

## 2014-01-20 NOTE — Progress Notes (Signed)
   Subjective:    Patient ID: Stacy Jensen, female    DOB: April 09, 1956, 57 y.o.   MRN: [id number]  Dysuria  This is a new problem. The current episode started in the past 7 days. Associated symptoms include frequency and urgency.   Unable to dip urine due to Azo. PMH UTI  Review of Systems  Genitourinary: Positive for dysuria, urgency and frequency.   denies fever chills sweats. Denies nausea vomiting diarrhea.     Objective:   Physical Exam Flanks nontender lower abdomen minimal tenderness no guarding or rebound lungs clear heart regular       Assessment & Plan:  Urinary tract infection. Her last one showed resistant and throat bacterial. I recommend Cefzil twice a day recommend culture await results Has bilateral cerumen impaction she needs a followup to have this irrigated  Patient had history of a complicated urinary tract infection that one time per in hospital with sepsis. Await the results of the culture take antibiotics as directed

## 2014-01-22 LAB — URINE CULTURE
Colony Count: NO GROWTH
ORGANISM ID, BACTERIA: NO GROWTH

## 2014-02-24 ENCOUNTER — Ambulatory Visit (INDEPENDENT_AMBULATORY_CARE_PROVIDER_SITE_OTHER): Payer: BC Managed Care – PPO | Admitting: Family Medicine

## 2014-02-24 ENCOUNTER — Encounter: Payer: Self-pay | Admitting: Family Medicine

## 2014-02-24 VITALS — BP 132/90 | Temp 97.8°F | Ht 60.05 in | Wt 159.0 lb

## 2014-02-24 DIAGNOSIS — J209 Acute bronchitis, unspecified: Secondary | ICD-10-CM

## 2014-02-24 MED ORDER — BENZONATATE 100 MG PO CAPS
100.0000 mg | ORAL_CAPSULE | Freq: Three times a day (TID) | ORAL | Status: DC
Start: 2014-02-24 — End: 2014-06-14

## 2014-02-24 MED ORDER — CLARITHROMYCIN 500 MG PO TABS: 500.0000 mg | ORAL_TABLET | Freq: Two times a day (BID) | ORAL | Status: DC

## 2014-02-24 NOTE — Progress Notes (Signed)
   Subjective:    Patient ID: Stacy Jensen, female    DOB: 12/18/1955, 58 y.o.   MRN: [id number]  Cough This is a new problem. Episode onset: Last Sunday. The problem has been gradually worsening. The problem occurs every few hours. The cough is non-productive. Associated symptoms include myalgias and wheezing. The symptoms are aggravated by lying down. She has tried OTC cough suppressant for the symptoms. The treatment provided mild relief.   Had achiness and discomfort   Diminished energy, and achey  Coughing and cong and wheezing  Wheezy and persisiting  Had a double set of symptoms.     Review of Systems  Respiratory: Positive for cough and wheezing.   Musculoskeletal: Positive for myalgias.   No vomiting no diarrhea    Objective:   Physical Exam  Alert moderate malaise frontal maxillary tenderness pharynx erythematous neck supple lungs rare rhonchi heart rare rate rhythm. Wheezy cough      Assessment & Plan:  Impression viral syndrome with bronchitis/rhinosinusitis plan Biaxin twice a day 10 days. Symptomatic care discussed. Warning signs discussed. Tessalon Perles when necessary. WSL

## 2014-03-01 ENCOUNTER — Other Ambulatory Visit: Payer: Self-pay | Admitting: Family Medicine

## 2014-05-17 ENCOUNTER — Telehealth: Payer: Self-pay | Admitting: Family Medicine

## 2014-05-17 DIAGNOSIS — E559 Vitamin D deficiency, unspecified: Secondary | ICD-10-CM

## 2014-05-17 DIAGNOSIS — D497 Neoplasm of unspecified behavior of endocrine glands and other parts of nervous system: Secondary | ICD-10-CM

## 2014-05-17 DIAGNOSIS — Z79899 Other long term (current) drug therapy: Secondary | ICD-10-CM

## 2014-05-17 DIAGNOSIS — R739 Hyperglycemia, unspecified: Secondary | ICD-10-CM

## 2014-05-17 DIAGNOSIS — E240 Pituitary-dependent Cushing's disease: Secondary | ICD-10-CM

## 2014-05-17 DIAGNOSIS — E039 Hypothyroidism, unspecified: Secondary | ICD-10-CM

## 2014-05-17 NOTE — Telephone Encounter (Signed)
Patient needs to have blood work done. She said that since she has Cushing disease, is she supposed to stop taking her steroids before her blood work?

## 2014-05-17 NOTE — Telephone Encounter (Signed)
She does need her blood work. If she does not already have orders she will need the following hemoglobin N2D, metabolic 7, TSH, free T4, urine micro-protein, ACTH plasma level, vitamin D-25 hydroxy level, prolactin level, cortisol. These test should be done fasting. She may take prednisone all the way up to the day before the blood draw. Reason for the test include hypothyroidism, Cushing's disease, hyperglycemia, vitamin D deficiency, pituitary tumor. Asked the patient if she still follows up with endocrinology.

## 2014-05-18 NOTE — Telephone Encounter (Signed)
TCNA (vm memory is full)

## 2014-05-18 NOTE — Telephone Encounter (Signed)
Notified patient that bloodwork has been ordered and she can take her steroids up until the day before blood draw. Patient states she is no longer seeing endocrinology.

## 2014-06-01 ENCOUNTER — Other Ambulatory Visit: Payer: Self-pay | Admitting: Family Medicine

## 2014-06-08 LAB — VITAMIN D 25 HYDROXY (VIT D DEFICIENCY, FRACTURES): Vit D, 25-Hydroxy: 42 ng/mL (ref 30–89)

## 2014-06-08 LAB — CORTISOL: Cortisol, Plasma: 8 ug/dL

## 2014-06-08 LAB — BASIC METABOLIC PANEL
BUN: 13 mg/dL (ref 6–23)
CO2: 25 mEq/L (ref 19–32)
Calcium: 9.3 mg/dL (ref 8.4–10.5)
Chloride: 107 mEq/L (ref 96–112)
Creat: 1.38 mg/dL — ABNORMAL HIGH (ref 0.50–1.10)
Glucose, Bld: 84 mg/dL (ref 70–99)
POTASSIUM: 4.1 meq/L (ref 3.5–5.3)
Sodium: 142 mEq/L (ref 135–145)

## 2014-06-08 LAB — T4, FREE: FREE T4: 1.2 ng/dL (ref 0.80–1.80)

## 2014-06-08 LAB — PROLACTIN: Prolactin: 9.5 ng/mL

## 2014-06-08 LAB — HEMOGLOBIN A1C
HEMOGLOBIN A1C: 5.9 % — AB (ref <5.7)
MEAN PLASMA GLUCOSE: 123 mg/dL — AB (ref <117)

## 2014-06-08 LAB — MICROALBUMIN, URINE: MICROALB UR: 0.5 mg/dL (ref 0.00–1.89)

## 2014-06-08 LAB — TSH: TSH: 2.389 u[IU]/mL (ref 0.350–4.500)

## 2014-06-09 LAB — ACTH: C206 ACTH: 50 pg/mL (ref 6–50)

## 2014-06-14 ENCOUNTER — Ambulatory Visit (INDEPENDENT_AMBULATORY_CARE_PROVIDER_SITE_OTHER): Payer: BC Managed Care – PPO | Admitting: Family Medicine

## 2014-06-14 ENCOUNTER — Encounter: Payer: Self-pay | Admitting: Family Medicine

## 2014-06-14 VITALS — BP 130/94 | Ht 60.05 in | Wt 155.4 lb

## 2014-06-14 DIAGNOSIS — E249 Cushing's syndrome, unspecified: Secondary | ICD-10-CM

## 2014-06-14 DIAGNOSIS — E785 Hyperlipidemia, unspecified: Secondary | ICD-10-CM

## 2014-06-14 DIAGNOSIS — E038 Other specified hypothyroidism: Secondary | ICD-10-CM

## 2014-06-14 DIAGNOSIS — H9191 Unspecified hearing loss, right ear: Secondary | ICD-10-CM

## 2014-06-14 DIAGNOSIS — H8113 Benign paroxysmal vertigo, bilateral: Secondary | ICD-10-CM

## 2014-06-14 DIAGNOSIS — I10 Essential (primary) hypertension: Secondary | ICD-10-CM

## 2014-06-14 DIAGNOSIS — H8110 Benign paroxysmal vertigo, unspecified ear: Secondary | ICD-10-CM | POA: Insufficient documentation

## 2014-06-14 DIAGNOSIS — H9190 Unspecified hearing loss, unspecified ear: Secondary | ICD-10-CM

## 2014-06-14 MED ORDER — LISINOPRIL 5 MG PO TABS: ORAL_TABLET | ORAL | Status: DC

## 2014-06-14 NOTE — Progress Notes (Signed)
   Subjective:    Patient ID: Stacy Jensen, female    DOB: 1956/01/27, 58 y.o.   MRN: [id number]  Hypertension This is a chronic problem. The current episode started more than 1 year ago. The problem has been gradually improving since onset. The problem is controlled. Pertinent negatives include no chest pain. There are no associated agents to hypertension. There are no known risk factors for coronary artery disease. Treatments tried: lisinopril. The current treatment provides significant improvement. There are no compliance problems.    Patient states that she has an episode of dizziness and vomiting back in February and April.she felt like she was spinning. Triggered by head turning.   Hearing loss is a concern also.High pitch ring. Ever since having cushings. No headaches   Patient has a lump on her head that has been there for several months. Occasionally sore.  Review of Systems  Constitutional: Negative for activity change, appetite change and fatigue.  Respiratory: Negative for cough.   Cardiovascular: Negative for chest pain.  Endocrine: Negative for polydipsia and polyphagia.  Genitourinary: Negative for frequency.  Neurological: Negative for weakness.  Psychiatric/Behavioral: Negative for confusion.       Objective:   Physical Exam  Vitals reviewed. Constitutional: She appears well-nourished. No distress.  Cardiovascular: Normal rate, regular rhythm and normal heart sounds.   No murmur heard. Pulmonary/Chest: Effort normal and breath sounds normal. No respiratory distress.  Musculoskeletal: She exhibits no edema.  Lymphadenopathy:    She has no cervical adenopathy.  Neurological: She is alert. She exhibits normal muscle tone.  Psychiatric: Her behavior is normal.          Assessment & Plan:  Colonoscopy advised PMH reviewed Head sebaceous cyst-minimal if he gets worse or she wants it removed let us know we will set up with general surgeon  1. Essential  hypertension, benign I would like to see her blood pressure better we will increase the dose of her medication followup again in 3-4 months if not doing better. Otherwise followup 6 months.  2. Cushing syndrome Seemingly under good control lab work is under good parameters  3. Other specified hypothyroidism Good control currently  4. Benign paroxysmal positional vertigo, bilateral She has had this intermittently I doubt that the sign of any type of serious problem if it becomes more persistent may need referral to ENT.  I am concerned about the hearing loss she is having. I believe that this patient would benefit from seeing ENT for further evaluation.

## 2014-06-23 ENCOUNTER — Encounter: Payer: Self-pay | Admitting: Family Medicine

## 2014-06-30 ENCOUNTER — Other Ambulatory Visit: Payer: Self-pay | Admitting: Family Medicine

## 2014-07-02 ENCOUNTER — Other Ambulatory Visit: Payer: Self-pay | Admitting: Family Medicine

## 2014-07-17 ENCOUNTER — Other Ambulatory Visit: Payer: Self-pay | Admitting: Family Medicine

## 2014-08-02 ENCOUNTER — Other Ambulatory Visit: Payer: Self-pay | Admitting: Family Medicine

## 2014-08-04 ENCOUNTER — Encounter: Payer: Self-pay | Admitting: Family Medicine

## 2014-08-04 LAB — LIPID PANEL
CHOLESTEROL: 211 mg/dL — AB (ref 0–200)
HDL: 45 mg/dL (ref >39)
LDL Cholesterol: 133 mg/dL — ABNORMAL HIGH (ref 0–99)
TRIGLYCERIDES: 166 mg/dL — AB (ref <150)
Total CHOL/HDL Ratio: 4.7 Ratio
VLDL: 33 mg/dL (ref 0–40)

## 2014-08-29 ENCOUNTER — Other Ambulatory Visit: Payer: Self-pay | Admitting: Family Medicine

## 2014-10-05 ENCOUNTER — Other Ambulatory Visit: Payer: Self-pay | Admitting: Family Medicine

## 2014-11-14 ENCOUNTER — Other Ambulatory Visit: Payer: Self-pay | Admitting: Family Medicine

## 2014-11-15 ENCOUNTER — Telehealth: Payer: Self-pay | Admitting: Family Medicine

## 2014-11-15 DIAGNOSIS — R7303 Prediabetes: Secondary | ICD-10-CM

## 2014-11-15 DIAGNOSIS — E249 Cushing's syndrome, unspecified: Secondary | ICD-10-CM

## 2014-11-15 DIAGNOSIS — Z79899 Other long term (current) drug therapy: Secondary | ICD-10-CM

## 2014-11-15 DIAGNOSIS — E785 Hyperlipidemia, unspecified: Secondary | ICD-10-CM

## 2014-11-15 NOTE — Telephone Encounter (Signed)
Cortisol prolactin ACTH, hemoglobin E3Q, metabolic 7, lipid-reason: Hyperlipidemia prediabetes renal insufficiency and Cushing's syndrome

## 2014-11-15 NOTE — Telephone Encounter (Signed)
Pt needs lab work call when ready   Pt would like her cholesterol done too  Last labs 9/14 Cortisol, Prolactin, ACTH, Vit D, MicroAlbumin Urine, T4Free, TSH, BMP, A1C  08/03/14 Lipid

## 2014-11-16 LAB — BASIC METABOLIC PANEL
BUN: 10 mg/dL (ref 6–23)
CHLORIDE: 108 meq/L (ref 96–112)
CO2: 26 mEq/L (ref 19–32)
CREATININE: 1.43 mg/dL — AB (ref 0.50–1.10)
Calcium: 9.6 mg/dL (ref 8.4–10.5)
GLUCOSE: 84 mg/dL (ref 70–99)
Potassium: 4 mEq/L (ref 3.5–5.3)
Sodium: 143 mEq/L (ref 135–145)

## 2014-11-16 LAB — LIPID PANEL
CHOLESTEROL: 218 mg/dL — AB (ref 0–200)
HDL: 47 mg/dL (ref >=46)
LDL Cholesterol: 135 mg/dL — ABNORMAL HIGH (ref 0–99)
Total CHOL/HDL Ratio: 4.6 Ratio
Triglycerides: 182 mg/dL — ABNORMAL HIGH (ref <150)
VLDL: 36 mg/dL (ref 0–40)

## 2014-11-16 NOTE — Telephone Encounter (Signed)
Bloodwork ordered. Patient was notified by front staff that she can go to the lab to have this done after lunch.

## 2014-11-17 LAB — HEMOGLOBIN A1C
Hgb A1c MFr Bld: 5.8 % — ABNORMAL HIGH (ref <5.7)
Mean Plasma Glucose: 120 mg/dL — ABNORMAL HIGH (ref <117)

## 2014-11-17 LAB — CORTISOL: Cortisol, Plasma: 11.7 ug/dL

## 2014-11-17 LAB — PROLACTIN: PROLACTIN: 9.4 ng/mL

## 2014-11-19 LAB — ACTH: C206 ACTH: 76 pg/mL — ABNORMAL HIGH (ref 6–50)

## 2014-11-22 ENCOUNTER — Encounter: Payer: Self-pay | Admitting: Family Medicine

## 2014-11-22 ENCOUNTER — Ambulatory Visit (INDEPENDENT_AMBULATORY_CARE_PROVIDER_SITE_OTHER): Payer: BLUE CROSS/BLUE SHIELD | Admitting: Family Medicine

## 2014-11-22 VITALS — BP 128/88 | Ht 60.05 in | Wt 159.4 lb

## 2014-11-22 DIAGNOSIS — N289 Disorder of kidney and ureter, unspecified: Secondary | ICD-10-CM | POA: Diagnosis not present

## 2014-11-22 DIAGNOSIS — E249 Cushing's syndrome, unspecified: Secondary | ICD-10-CM | POA: Diagnosis not present

## 2014-11-22 DIAGNOSIS — E781 Pure hyperglyceridemia: Secondary | ICD-10-CM | POA: Diagnosis not present

## 2014-11-22 DIAGNOSIS — I10 Essential (primary) hypertension: Secondary | ICD-10-CM | POA: Diagnosis not present

## 2014-11-22 DIAGNOSIS — E785 Hyperlipidemia, unspecified: Secondary | ICD-10-CM | POA: Diagnosis not present

## 2014-11-22 DIAGNOSIS — E669 Obesity, unspecified: Secondary | ICD-10-CM | POA: Diagnosis not present

## 2014-11-22 MED ORDER — LISINOPRIL 5 MG PO TABS: ORAL_TABLET | ORAL | Status: DC

## 2014-11-22 MED ORDER — HYDROCORTISONE 10 MG PO TABS: ORAL_TABLET | ORAL | Status: DC

## 2014-11-22 MED ORDER — LEVOTHYROXINE SODIUM 75 MCG PO TABS: 75.0000 ug | ORAL_TABLET | Freq: Every day | ORAL | Status: DC

## 2014-11-22 MED ORDER — FENOFIBRATE 160 MG PO TABS: 160.0000 mg | ORAL_TABLET | Freq: Every day | ORAL | Status: DC

## 2014-11-22 NOTE — Progress Notes (Signed)
   Subjective:    Patient ID: Stacy Jensen, female    DOB: 09-02-1956, 59 y.o.   MRN: [id number]  Hypertension This is a chronic problem. The current episode started more than 1 year ago. The problem has been gradually improving since onset. The problem is controlled. Pertinent negatives include no chest pain. There are no associated agents to hypertension. There are no known risk factors for coronary artery disease. Treatments tried: lisinopril. The current treatment provides significant improvement. There are no compliance problems.    Patient states that she has no concerns at this time.  The patient is taking her glucocorticoid. She states she does not watch her diet as well as she should. She also states that she consumes too many sodas. She is not walking as much as she would like. She states she does check her blood pressure outside the office and routinely gets around 130/86. She denies any dysuria hematuria or hematochezia.  Review of Systems  Constitutional: Negative for activity change, appetite change and fatigue.  HENT: Negative for congestion.   Respiratory: Negative for cough.   Cardiovascular: Negative for chest pain.  Gastrointestinal: Negative for abdominal pain.  Endocrine: Negative for polydipsia and polyphagia.  Neurological: Negative for weakness.  Psychiatric/Behavioral: Negative for confusion.       Objective:   Physical Exam  Constitutional: She appears well-nourished. No distress.  Cardiovascular: Normal rate, regular rhythm and normal heart sounds.   No murmur heard. Pulmonary/Chest: Effort normal and breath sounds normal. No respiratory distress.  Musculoskeletal: She exhibits no edema.  Lymphadenopathy:    She has no cervical adenopathy.  Neurological: She is alert. She exhibits normal muscle tone.  Psychiatric: Her behavior is normal.  Vitals reviewed.         Assessment & Plan:  1. Essential hypertension, benign Blood pressure is decent. I am  concerned about her creatinine function going slightly up. She needs watch proteins in the diet exercise on a regular basis try to bring weight down. - Basic metabolic panel - Microalbumin / creatinine urine ratio  2. Cushing syndrome This patient has had pituitary surgery several years back. She's been taking glucocorticoid since. She was seen endocrinologist but she was having difficult time affording so she followed up here we've a monitoring her lab work. Her ACTH is gone up some. I informed the patient that I really would like to recheck that again in 3-5 months and if ACTH is still elevated I recommend that she go back to the endocrinologist. - Basic metabolic panel - Microalbumin / creatinine urine ratio  3. Renal insufficiency Avoid excessive protein in the diet check metabolic 7 in a albumin creatinine urine ratio await the results of this. Keep blood pressure under good control regular walking bringing weight down helpful - Basic metabolic panel - Microalbumin / creatinine urine ratio  4. Hyperlipidemia Watch fats in the diet cholesterol is not as bad as what it has been but ideally she would like to get the LDL below 100. Minimize sodas exercise bring weight down  5. Hypertriglyceridemia See above.  Obesity-encourage patient to lose weight. Lab work within the next few weeks to recheck metabolic 7 Follow-up office visit 3-5 months check cortisol ACTH and TSH at that time

## 2014-11-22 NOTE — Patient Instructions (Signed)
Met 7 and urine test within the next 3 weeks non fasting  ACTH panel in 3 to 5 months ( call in late May/Early June    DASH Eating Plan DASH stands for "Dietary Approaches to Stop Hypertension." The DASH eating plan is a healthy eating plan that has been shown to reduce high blood pressure (hypertension). Additional health benefits may include reducing the risk of type 2 diabetes mellitus, heart disease, and stroke. The DASH eating plan may also help with weight loss. WHAT DO I NEED TO KNOW ABOUT THE DASH EATING PLAN? For the DASH eating plan, you will follow these general guidelines:  Choose foods with a percent daily value for sodium of less than 5% (as listed on the food label).  Use salt-free seasonings or herbs instead of table salt or sea salt.  Check with your health care provider or pharmacist before using salt substitutes.  Eat lower-sodium products, often labeled as "lower sodium" or "no salt added."  Eat fresh foods.  Eat more vegetables, fruits, and low-fat dairy products.  Choose whole grains. Look for the word "whole" as the first word in the ingredient list.  Choose fish and skinless chicken or [place] more often than red meat. Limit fish, poultry, and meat to 6 oz (170 g) each day.  Limit sweets, desserts, sugars, and sugary drinks.  Choose heart-healthy fats.  Limit cheese to 1 oz (28 g) per day.  Eat more home-cooked food and less restaurant, buffet, and fast food.  Limit fried foods.  Cook foods using methods other than frying.  Limit canned vegetables. If you do use them, rinse them well to decrease the sodium.  When eating at a restaurant, ask that your food be prepared with less salt, or no salt if possible. WHAT FOODS CAN I EAT? Seek help from a dietitian for individual calorie needs. Grains Whole grain or whole wheat bread. Brown rice. Whole grain or whole wheat pasta. Quinoa, bulgur, and whole grain cereals. Low-sodium cereals. Corn or whole wheat  flour tortillas. Whole grain cornbread. Whole grain crackers. Low-sodium crackers. Vegetables Fresh or frozen vegetables (raw, steamed, roasted, or grilled). Low-sodium or reduced-sodium tomato and vegetable juices. Low-sodium or reduced-sodium tomato sauce and paste. Low-sodium or reduced-sodium canned vegetables.  Fruits All fresh, canned (in natural juice), or frozen fruits. Meat and Other Protein Products Ground beef (85% or leaner), grass-fed beef, or beef trimmed of fat. Skinless chicken or [place]. Ground chicken or [place]. Pork trimmed of fat. All fish and seafood. Eggs. Dried beans, peas, or lentils. Unsalted nuts and seeds. Unsalted canned beans. Dairy Low-fat dairy products, such as skim or 1% milk, 2% or reduced-fat cheeses, low-fat ricotta or cottage cheese, or plain low-fat yogurt. Low-sodium or reduced-sodium cheeses. Fats and Oils Tub margarines without trans fats. Light or reduced-fat mayonnaise and salad dressings (reduced sodium). Avocado. Safflower, olive, or canola oils. Natural peanut or almond butter. Other Unsalted popcorn and pretzels. The items listed above may not be a complete list of recommended foods or beverages. Contact your dietitian for more options. WHAT FOODS ARE NOT RECOMMENDED? Grains White bread. White pasta. White rice. Refined cornbread. Bagels and croissants. Crackers that contain trans fat. Vegetables Creamed or fried vegetables. Vegetables in a cheese sauce. Regular canned vegetables. Regular canned tomato sauce and paste. Regular tomato and vegetable juices. Fruits Dried fruits. Canned fruit in light or heavy syrup. Fruit juice. Meat and Other Protein Products Fatty cuts of meat. Ribs, chicken wings, bacon, sausage, bologna, salami, chitterlings, fatback, hot  dogs, bratwurst, and packaged luncheon meats. Salted nuts and seeds. Canned beans with salt. Dairy Whole or 2% milk, cream, half-and-half, and cream cheese. Whole-fat or sweetened yogurt.  Full-fat cheeses or blue cheese. Nondairy creamers and whipped toppings. Processed cheese, cheese spreads, or cheese curds. Condiments Onion and garlic salt, seasoned salt, table salt, and sea salt. Canned and packaged gravies. Worcestershire sauce. Tartar sauce. Barbecue sauce. Teriyaki sauce. Soy sauce, including reduced sodium. Steak sauce. Fish sauce. Oyster sauce. Cocktail sauce. Horseradish. Ketchup and mustard. Meat flavorings and tenderizers. Bouillon cubes. Hot sauce. Tabasco sauce. Marinades. Taco seasonings. Relishes. Fats and Oils Butter, stick margarine, lard, shortening, ghee, and bacon fat. Coconut, palm kernel, or palm oils. Regular salad dressings. Other Pickles and olives. Salted popcorn and pretzels. The items listed above may not be a complete list of foods and beverages to avoid. Contact your dietitian for more information. WHERE CAN I FIND MORE INFORMATION? National Heart, Lung, and Blood Institute: travelstabloid.com Document Released: 08/30/2011 Document Revised: 01/25/2014 Document Reviewed: 07/15/2013 [facility] Patient Information 2015 [place], [place]. This information is not intended to replace advice given to you by your health care provider. Make sure you discuss any questions you have with your health care provider.

## 2015-03-14 ENCOUNTER — Telehealth: Payer: Self-pay | Admitting: Family Medicine

## 2015-03-14 DIAGNOSIS — H16009 Unspecified corneal ulcer, unspecified eye: Secondary | ICD-10-CM

## 2015-03-14 NOTE — Telephone Encounter (Signed)
Pt states Dr. Kellie Moor office called and told her she would have to pay a $75 co pay there to and it would be best to go back to where she was orignally seen for a followup. Pt did go back to [facility] and was seen today. She states ulcer was much better. Healing good and they gave her some eye ointment to use.

## 2015-03-14 NOTE — Telephone Encounter (Signed)
[facility] to see if pt got appt today.

## 2015-03-14 NOTE — Telephone Encounter (Signed)
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

## 2015-03-14 NOTE — Telephone Encounter (Signed)
Discussed with pt. Pt states she prefers Dr. Gershon Crane and she has seen him in the past. Last eye exam was with him. Called DR. Shapiro's office and gave her info. Dr. Kellie Moor office to call pt and schedule appt. For pt.

## 2015-03-14 NOTE — Telephone Encounter (Signed)
Pt states she took her contacts out an removed 80% of her cornea according to her  Eye doctor that she went to see at [facility]. He put a "bandaid" on her eye, which  Is like a contact lense and told her to come back ever day at $75 a pop, pt told them  No she couldn't do that. She has not gone back, she doesn't know what to do at this point. She has been dropping antibiotic drops in her eye (tobramycin sulfate). She wants to know  If she should come see you or go see an Opthalmologist instead of an Optometrist?   She states she is not in as much pain as the eye doc feels she should be, not sure it's  As bad as he says it is.   Please advise

## 2015-04-07 ENCOUNTER — Telehealth: Payer: Self-pay | Admitting: Family Medicine

## 2015-04-07 DIAGNOSIS — E785 Hyperlipidemia, unspecified: Secondary | ICD-10-CM

## 2015-04-07 DIAGNOSIS — E249 Cushing's syndrome, unspecified: Secondary | ICD-10-CM

## 2015-04-07 DIAGNOSIS — R7301 Impaired fasting glucose: Secondary | ICD-10-CM

## 2015-04-07 DIAGNOSIS — Z79899 Other long term (current) drug therapy: Secondary | ICD-10-CM

## 2015-04-07 NOTE — Telephone Encounter (Signed)
Calling to get order for lab tests for appointment next week.

## 2015-04-07 NOTE — Telephone Encounter (Signed)
Same labs as 11/16/14-thank you

## 2015-04-08 NOTE — Telephone Encounter (Signed)
Left message on voicemail notifying patient that blood work has been ordered.  

## 2015-04-12 LAB — BASIC METABOLIC PANEL
BUN/Creatinine Ratio: 8 — ABNORMAL LOW (ref 11–26)
BUN: 11 mg/dL (ref 8–27)
CO2: 23 mmol/L (ref 18–29)
CREATININE: 1.43 mg/dL — AB (ref 0.57–1.00)
Calcium: 9.6 mg/dL (ref 8.7–10.3)
Chloride: 105 mmol/L (ref 97–108)
GFR calc Af Amer: 46 mL/min/{1.73_m2} — ABNORMAL LOW (ref >59)
GFR calc non Af Amer: 40 mL/min/{1.73_m2} — ABNORMAL LOW (ref >59)
Glucose: 90 mg/dL (ref 65–99)
Potassium: 4.4 mmol/L (ref 3.5–5.2)
SODIUM: 144 mmol/L (ref 134–144)

## 2015-04-12 LAB — LIPID PANEL
CHOL/HDL RATIO: 4.6 ratio — AB (ref 0.0–4.4)
Cholesterol, Total: 211 mg/dL — ABNORMAL HIGH (ref 100–199)
HDL: 46 mg/dL (ref >39)
LDL Calculated: 132 mg/dL — ABNORMAL HIGH (ref 0–99)
Triglycerides: 164 mg/dL — ABNORMAL HIGH (ref 0–149)
VLDL Cholesterol Cal: 33 mg/dL (ref 5–40)

## 2015-04-12 LAB — HEMOGLOBIN A1C
Est. average glucose Bld gHb Est-mCnc: 117 mg/dL
HEMOGLOBIN A1C: 5.7 % — AB (ref 4.8–5.6)

## 2015-04-12 LAB — CORTISOL: CORTISOL: 8.5 ug/dL

## 2015-04-12 LAB — ACTH: ACTH: 52.5 pg/mL (ref 7.2–63.3)

## 2015-04-12 LAB — PROLACTIN: Prolactin: 14.4 ng/mL (ref 4.8–23.3)

## 2015-04-18 ENCOUNTER — Encounter: Payer: Self-pay | Admitting: Family Medicine

## 2015-04-18 ENCOUNTER — Ambulatory Visit (INDEPENDENT_AMBULATORY_CARE_PROVIDER_SITE_OTHER): Payer: BLUE CROSS/BLUE SHIELD | Admitting: Family Medicine

## 2015-04-18 VITALS — BP 124/76 | Ht 60.5 in | Wt 156.5 lb

## 2015-04-18 DIAGNOSIS — E038 Other specified hypothyroidism: Secondary | ICD-10-CM

## 2015-04-18 DIAGNOSIS — E249 Cushing's syndrome, unspecified: Secondary | ICD-10-CM | POA: Diagnosis not present

## 2015-04-18 DIAGNOSIS — E785 Hyperlipidemia, unspecified: Secondary | ICD-10-CM

## 2015-04-18 DIAGNOSIS — I10 Essential (primary) hypertension: Secondary | ICD-10-CM

## 2015-04-18 MED ORDER — LISINOPRIL 5 MG PO TABS: ORAL_TABLET | ORAL | Status: DC

## 2015-04-18 MED ORDER — HYDROCORTISONE 10 MG PO TABS: ORAL_TABLET | ORAL | Status: DC

## 2015-04-18 MED ORDER — LEVOTHYROXINE SODIUM 75 MCG PO TABS: 75.0000 ug | ORAL_TABLET | Freq: Every day | ORAL | Status: DC

## 2015-04-18 MED ORDER — FENOFIBRATE 160 MG PO TABS: 160.0000 mg | ORAL_TABLET | Freq: Every day | ORAL | Status: DC

## 2015-04-18 NOTE — Patient Instructions (Signed)
Dear Patient,  It has been recommended to you that you have a colonoscopy. It is your responsibility to carry through with this recommendation.   Did you realize that colon cancer is the second leading cancer killer in the [place]. One in every 20 adults will get colon cancer. If all adults would go through the recommended screening for colon cancer (getting a colonoscopy), then there would be a 60% reduction in the number of people dying from colon cancer.  Colon cancer just doesn't come out of the blue. It starts off as a small polyp which over time grows into a cancer. A colonoscopy can prevent cancer and in many cases detected when it is at a very treatable phase. Small colon cancers can have cure rates of 95%. Advanced colon cancer, which often occurs in people who do not do their screenings, have cure rates less than 20%. The risk of colon cancer advances with age. Most adults should have regular colonoscopies every 10 years starting at age 50. This recommendation can vary depending on a person's medical history.  Health-care laws now allow for you to call the gastroenterologist office directly in order to set yourself up for this very important tests. Today we have recommended to you that you do this test. This test may save your life. Failure to do this test puts you at risk for premature death from colon cancer. Do the right thing and schedule this test now.  Here as a list of specialists we recommend in the surrounding area. When you call their office let them know that you are a patient of our practice in your interested in doing a screening colonoscopy. They should assist you without problems. You will need the following information when you called them: 1-name of which Dr. you see, 2-your insurance information, 3-a list of medications that you currently take, 4-any allergies you have to medications.  [place] gastroenterologist Dr. Mike Rourk, Dr Sandi Fields   [place]  gastroenterologist   [phone]  Dr.Najeeb Rehman [facility]   [phone]  [place] [facility] (Dr. Perry, N, Stark, Brodie, Gesner, Jacobs and Pyrtle) [phone]  [facility] (Dr. Buscemi, Edwards, Hayes, Maygod,Outlaw,Schooler) [phone]  Each group of specialists has assured us that when you called them they will help you get your colonoscopy set up. Should you have problems please let us know. Be sure to call soon. Sincerely, Carolyn Hoskins, Dr Steve Emmett Bracknell, Dr.Gurneet Matarese    

## 2015-04-18 NOTE — Progress Notes (Signed)
   Subjective:    Patient ID: Stacy Jensen, female    DOB: Apr 30, 1956, 59 y.o.   MRN: [id number]  Hypertension This is a chronic problem. The current episode started more than 1 year ago. The problem has been gradually improving since onset. Pertinent negatives include no chest pain. There are no associated agents to hypertension. There are no known risk factors for coronary artery disease. Treatments tried: lisinopril. The current treatment provides significant improvement. There are no compliance problems.    Patient has no concerns at this time.  Long discussion held with patient regarding her blood pressure importance of regular exercise and diet, discussion regarding prevention of diabetes, discussion regarding thyroid, discussion regarding the need to seek endocrinology for her underlying cushions  Review of Systems  Constitutional: Negative for activity change, appetite change and fatigue.  HENT: Negative for congestion.   Respiratory: Negative for cough.   Cardiovascular: Negative for chest pain.  Gastrointestinal: Negative for abdominal pain.  Endocrine: Negative for polydipsia and polyphagia.  Neurological: Negative for weakness.  Psychiatric/Behavioral: Negative for confusion.       Objective:   Physical Exam  Constitutional: She appears well-nourished. No distress.  Cardiovascular: Normal rate, regular rhythm and normal heart sounds.   No murmur heard. Pulmonary/Chest: Effort normal and breath sounds normal. No respiratory distress.  Musculoskeletal: She exhibits no edema.  Lymphadenopathy:    She has no cervical adenopathy.  Neurological: She is alert. She exhibits normal muscle tone.  Psychiatric: Her behavior is normal.  Vitals reviewed.         Assessment & Plan:  1. Essential hypertension, benign Blood pressure under good control continue current measures  2. Other specified hypothyroidism Thyroid good control continue current medication  3. Cushing  syndrome His best I can tell under good control but I am concerned about her hirsutism I would recommend the patient see her endocrinologist. The patient willingly states she will go see her but hasn't seen her in quite some time  4. Hyperlipidemia LDL still above target zone but triglycerides look good  25 minutes spent with patient colonoscopy recommended

## 2015-04-21 ENCOUNTER — Other Ambulatory Visit: Payer: Self-pay | Admitting: Family Medicine

## 2015-04-25 ENCOUNTER — Encounter: Payer: Self-pay | Admitting: Family Medicine

## 2015-08-23 ENCOUNTER — Ambulatory Visit (INDEPENDENT_AMBULATORY_CARE_PROVIDER_SITE_OTHER): Payer: BLUE CROSS/BLUE SHIELD | Admitting: Family Medicine

## 2015-08-23 ENCOUNTER — Encounter: Payer: Self-pay | Admitting: Family Medicine

## 2015-08-23 VITALS — Temp 99.1°F | Ht 60.5 in | Wt 160.1 lb

## 2015-08-23 DIAGNOSIS — R300 Dysuria: Secondary | ICD-10-CM

## 2015-08-23 DIAGNOSIS — N3000 Acute cystitis without hematuria: Secondary | ICD-10-CM | POA: Diagnosis not present

## 2015-08-23 DIAGNOSIS — J208 Acute bronchitis due to other specified organisms: Secondary | ICD-10-CM

## 2015-08-23 LAB — POCT URINALYSIS DIPSTICK
PH UA: 6
Spec Grav, UA: 1.015

## 2015-08-23 MED ORDER — CEFPROZIL 500 MG PO TABS: 500.0000 mg | ORAL_TABLET | Freq: Two times a day (BID) | ORAL | Status: DC

## 2015-08-23 NOTE — Progress Notes (Signed)
   Subjective:    Patient ID: Stacy Jensen, female    DOB: Aug 30, 1956, 59 y.o.   MRN: [id number]  Urinary Tract Infection  This is a new problem. The current episode started in the past 7 days. The problem has been unchanged. The quality of the pain is described as burning. The pain is moderate. There has been no fever. She has tried nothing for the symptoms. The treatment provided no relief.   Patient also complains of chest congestion.  Patient with head congestion chest congestion coughing no wheezing no difficulty breathing. Sinus pressure and pain in addition this dysuria and urinary for disease symptoms over the past several  Review of Systems  Constitutional: Negative for fever and activity change.  HENT: Positive for congestion and rhinorrhea. Negative for ear pain.   Eyes: Negative for discharge.  Respiratory: Positive for cough. Negative for shortness of breath and wheezing.   Cardiovascular: Negative for chest pain.       Objective:   Physical Exam  Constitutional: She appears well-developed.  HENT:  Head: Normocephalic.  Nose: Nose normal.  Mouth/Throat: Oropharynx is clear and moist. No oropharyngeal exudate.  Neck: Neck supple.  Cardiovascular: Normal rate and normal heart sounds.   No murmur heard. Pulmonary/Chest: Effort normal and breath sounds normal. She has no wheezes.  Abdominal: Soft.  Lymphadenopathy:    She has no cervical adenopathy.  Skin: Skin is warm and dry.  Nursing note and vitals reviewed.   No respiratory difficulty lungs sound perfectly normal      Assessment & Plan:  Viral illness Secondary rhinosinusitis Urinary tract infection Antibodies prescribed warning signs discussed follow-up ongoing trouble.

## 2015-09-25 LAB — HM DIABETES EYE EXAM

## 2015-10-11 ENCOUNTER — Telehealth: Payer: Self-pay | Admitting: Family Medicine

## 2015-10-11 DIAGNOSIS — E039 Hypothyroidism, unspecified: Secondary | ICD-10-CM

## 2015-10-11 DIAGNOSIS — I10 Essential (primary) hypertension: Secondary | ICD-10-CM

## 2015-10-11 DIAGNOSIS — E249 Cushing's syndrome, unspecified: Secondary | ICD-10-CM

## 2015-10-11 DIAGNOSIS — E785 Hyperlipidemia, unspecified: Secondary | ICD-10-CM

## 2015-10-11 NOTE — Telephone Encounter (Signed)
Blood work ordered in EPIC. Patient notified. 

## 2015-10-11 NOTE — Telephone Encounter (Signed)
Met 7, lipid, liver, TSH, hemoglobin A1c-I assume that she is having her other endocrinology lab work followed by her endocrinologist.

## 2015-10-11 NOTE — Telephone Encounter (Signed)
Pt is requesting labs to be sent over for her upcoming appt. Last labs per epic were: lipid,bmp,a1c,acth,prolactin,and cortisol on 04/11/15

## 2015-10-14 LAB — HEPATIC FUNCTION PANEL
ALT: 19 IU/L (ref 0–32)
AST: 26 IU/L (ref 0–40)
Albumin: 4.3 g/dL (ref 3.6–4.8)
Alkaline Phosphatase: 36 IU/L — ABNORMAL LOW (ref 39–117)
BILIRUBIN TOTAL: 0.4 mg/dL (ref 0.0–1.2)
Bilirubin, Direct: 0.14 mg/dL (ref 0.00–0.40)
Total Protein: 7.3 g/dL (ref 6.0–8.5)

## 2015-10-14 LAB — LIPID PANEL
CHOL/HDL RATIO: 4.8 ratio — AB (ref 0.0–4.4)
Cholesterol, Total: 232 mg/dL — ABNORMAL HIGH (ref 100–199)
HDL: 48 mg/dL (ref >39)
LDL CALC: 153 mg/dL — AB (ref 0–99)
Triglycerides: 156 mg/dL — ABNORMAL HIGH (ref 0–149)
VLDL Cholesterol Cal: 31 mg/dL (ref 5–40)

## 2015-10-14 LAB — BASIC METABOLIC PANEL
BUN/Creatinine Ratio: 9 — ABNORMAL LOW (ref 11–26)
BUN: 12 mg/dL (ref 8–27)
CO2: 26 mmol/L (ref 18–29)
CREATININE: 1.31 mg/dL — AB (ref 0.57–1.00)
Calcium: 9.8 mg/dL (ref 8.7–10.3)
Chloride: 104 mmol/L (ref 96–106)
GFR, EST AFRICAN AMERICAN: 51 mL/min/{1.73_m2} — AB (ref >59)
GFR, EST NON AFRICAN AMERICAN: 44 mL/min/{1.73_m2} — AB (ref >59)
Glucose: 95 mg/dL (ref 65–99)
Potassium: 4.7 mmol/L (ref 3.5–5.2)
SODIUM: 144 mmol/L (ref 134–144)

## 2015-10-14 LAB — TSH: TSH: 0.231 u[IU]/mL — AB (ref 0.450–4.500)

## 2015-10-14 LAB — HEMOGLOBIN A1C
ESTIMATED AVERAGE GLUCOSE: 126 mg/dL
HEMOGLOBIN A1C: 6 % — AB (ref 4.8–5.6)

## 2015-10-19 ENCOUNTER — Ambulatory Visit (INDEPENDENT_AMBULATORY_CARE_PROVIDER_SITE_OTHER): Payer: BLUE CROSS/BLUE SHIELD | Admitting: Family Medicine

## 2015-10-19 ENCOUNTER — Encounter: Payer: Self-pay | Admitting: Family Medicine

## 2015-10-19 VITALS — BP 126/78 | Ht 60.5 in | Wt 157.6 lb

## 2015-10-19 DIAGNOSIS — E785 Hyperlipidemia, unspecified: Secondary | ICD-10-CM | POA: Diagnosis not present

## 2015-10-19 DIAGNOSIS — I10 Essential (primary) hypertension: Secondary | ICD-10-CM | POA: Diagnosis not present

## 2015-10-19 DIAGNOSIS — R7303 Prediabetes: Secondary | ICD-10-CM | POA: Diagnosis not present

## 2015-10-19 DIAGNOSIS — E038 Other specified hypothyroidism: Secondary | ICD-10-CM | POA: Diagnosis not present

## 2015-10-19 DIAGNOSIS — E249 Cushing's syndrome, unspecified: Secondary | ICD-10-CM

## 2015-10-19 NOTE — Patient Instructions (Signed)
Dear Patient,  It has been recommended to you that you have a colonoscopy. It is your responsibility to carry through with this recommendation.   Did you realize that colon cancer is the second leading cancer killer in the [place]. One in every 20 adults will get colon cancer. If all adults would go through the recommended screening for colon cancer (getting a colonoscopy), then there would be a 60% reduction in the number of people dying from colon cancer.  Colon cancer just doesn't come out of the blue. It starts off as a small polyp which over time grows into a cancer. A colonoscopy can prevent cancer and in many cases detected when it is at a very treatable phase. Small colon cancers can have cure rates of 95%. Advanced colon cancer, which often occurs in people who do not do their screenings, have cure rates less than 20%. The risk of colon cancer advances with age. Most adults should have regular colonoscopies every 10 years starting at age 50. This recommendation can vary depending on a person's medical history.  Health-care laws now allow for you to call the gastroenterologist office directly in order to set yourself up for this very important tests. Today we have recommended to you that you do this test. This test may save your life. Failure to do this test puts you at risk for premature death from colon cancer. Do the right thing and schedule this test now.  Here as a list of specialists we recommend in the surrounding area. When you call their office let them know that you are a patient of our practice in your interested in doing a screening colonoscopy. They should assist you without problems. You will need the following information when you called them: 1-name of which Dr. you see, 2-your insurance information, 3-a list of medications that you currently take, 4-any allergies you have to medications.  [place] gastroenterologist Dr. Mike Rourk, Dr Sandi Fields   [place]  gastroenterologist   [phone]  Dr.Najeeb Rehman [facility]   [phone]  [place] [facility] (Dr. Perry, N, Stark, Brodie, Gesner, Jacobs and Pyrtle) [phone]  [facility] (Dr. Buscemi, Edwards, Hayes, Maygod,Outlaw,Schooler) [phone]  Each group of specialists has assured us that when you called them they will help you get your colonoscopy set up. Should you have problems or if the GI practice insist a referral be done please let us know. Be sure to call soon. Sincerely, Carolyn Hoskins, Dr Steve Luking, Dr.Scott Luking    

## 2015-10-19 NOTE — Progress Notes (Signed)
   Subjective:    Patient ID: Stacy Jensen, female    DOB: Jan 02, 1956, 60 y.o.   MRN: [id number]  Hypertension This is a chronic problem. The current episode started more than 1 year ago. Pertinent negatives include no chest pain. Risk factors for coronary artery disease include dyslipidemia and post-menopausal state. Treatments tried: lisinopril. There are no compliance problems.    discussion held regarding her Cushing's she seems to be doing well with the medication she sees endocrinology once a year she is hoping that we will manage this without having see endocrinology She takes her thyroid medicine on a regular basis seems to help her with her strength and energy denies having any troubles with this Severe hyperlipidemia issues in the past she is not like statins causing made her feel achy but she is willing to try again if necessary Having some soreness in her fingers with activity  Check possible cyst on left pointer finger  Review of Systems  Constitutional: Negative for activity change, appetite change and fatigue.  HENT: Negative for congestion.   Respiratory: Negative for cough.   Cardiovascular: Negative for chest pain.  Gastrointestinal: Negative for abdominal pain and blood in stool.  Endocrine: Negative for polydipsia and polyphagia.  Musculoskeletal: Negative for back pain.  Neurological: Negative for weakness.  Psychiatric/Behavioral: Negative for confusion.       Objective:   Physical Exam  Constitutional: She appears well-nourished. No distress.  Cardiovascular: Normal rate, regular rhythm and normal heart sounds.   No murmur heard. Pulmonary/Chest: Effort normal and breath sounds normal. No respiratory distress.  Musculoskeletal: She exhibits no edema.  Lymphadenopathy:    She has no cervical adenopathy.  Neurological: She is alert. She exhibits normal muscle tone.  Psychiatric: Her behavior is normal.  Vitals reviewed.   Slight changes of osteoarthritis  noted in the distal fingers  25 minutes was spent with the patient. Greater than half the time was spent in discussion and answering questions and counseling regarding the issues that the patient came in for today.     Assessment & Plan:  1. Cushing syndrome ([place]) She will have notes from her endocrinologist sent to [place] so that we can evaluate whether or not she needs to follow-up with them I think she ought to follow-up with them once a year. The patient doesn't see much value in this.  2. Essential hypertension, benign Blood pressure good control continue current measures watch diet stay physically active  3. Hyperlipidemia Severe elevation at risk for heart disease recommend starting cholesterol medicine she is very hesitant because she states he doesn't like Makes her feel but she has not had any major side effects with it in the past she will start off one 3 times a week gradually increase as tolerated  4. Prediabetes Prediabetes actually getting closer to the level of diabetes importance of exercise watching diet recheck this again may need medications in the future  5. Other specified hypothyroidism Thyroid slightly off but so close to the margin keep as is recheck again in 3-4 months Osteoarthritis in the hands noted in the distal fingers no need for orthopedic referral at this point

## 2015-11-13 ENCOUNTER — Encounter: Payer: Self-pay | Admitting: Family Medicine

## 2015-11-13 DIAGNOSIS — E240 Pituitary-dependent Cushing's disease: Secondary | ICD-10-CM | POA: Insufficient documentation

## 2015-11-13 DIAGNOSIS — E271 Primary adrenocortical insufficiency: Secondary | ICD-10-CM | POA: Insufficient documentation

## 2015-11-20 ENCOUNTER — Encounter: Payer: Self-pay | Admitting: Family Medicine

## 2015-11-20 DIAGNOSIS — M8580 Other specified disorders of bone density and structure, unspecified site: Secondary | ICD-10-CM | POA: Insufficient documentation

## 2016-01-14 ENCOUNTER — Other Ambulatory Visit: Payer: Self-pay | Admitting: Family Medicine

## 2016-01-25 ENCOUNTER — Telehealth: Payer: Self-pay | Admitting: Family Medicine

## 2016-01-25 DIAGNOSIS — E119 Type 2 diabetes mellitus without complications: Secondary | ICD-10-CM

## 2016-01-25 DIAGNOSIS — E785 Hyperlipidemia, unspecified: Secondary | ICD-10-CM

## 2016-01-25 DIAGNOSIS — N289 Disorder of kidney and ureter, unspecified: Secondary | ICD-10-CM

## 2016-01-25 DIAGNOSIS — E039 Hypothyroidism, unspecified: Secondary | ICD-10-CM

## 2016-01-25 NOTE — Telephone Encounter (Signed)
Pt is requesting labs to be sent over for an upcoming appt. Last labs per epic were: Bmp,lipid,hepatic,tsh and a1c on 10/13/15.

## 2016-01-25 NOTE — Telephone Encounter (Signed)
Met 7, TSH, hemoglobin A1c, lipid-hyperlipidemia, hypothyroidism, renal insufficiency, diabetes

## 2016-01-26 NOTE — Telephone Encounter (Signed)
[facility] 01/26/16

## 2016-01-26 NOTE — Telephone Encounter (Signed)
Spoke with patient and informed her per [place] were ordered for upcoming appointment.Needs to be fasting prior to labs being drawn. Patient verbalized understanding.

## 2016-01-30 ENCOUNTER — Other Ambulatory Visit: Payer: Self-pay | Admitting: Family Medicine

## 2016-02-13 DIAGNOSIS — E039 Hypothyroidism, unspecified: Secondary | ICD-10-CM | POA: Diagnosis not present

## 2016-02-13 DIAGNOSIS — N289 Disorder of kidney and ureter, unspecified: Secondary | ICD-10-CM | POA: Diagnosis not present

## 2016-02-13 DIAGNOSIS — E785 Hyperlipidemia, unspecified: Secondary | ICD-10-CM | POA: Diagnosis not present

## 2016-02-13 DIAGNOSIS — E119 Type 2 diabetes mellitus without complications: Secondary | ICD-10-CM | POA: Diagnosis not present

## 2016-02-14 LAB — LIPID PANEL
CHOL/HDL RATIO: 4.6 ratio — AB (ref 0.0–4.4)
Cholesterol, Total: 228 mg/dL — ABNORMAL HIGH (ref 100–199)
HDL: 50 mg/dL (ref >39)
LDL Calculated: 146 mg/dL — ABNORMAL HIGH (ref 0–99)
Triglycerides: 158 mg/dL — ABNORMAL HIGH (ref 0–149)
VLDL CHOLESTEROL CAL: 32 mg/dL (ref 5–40)

## 2016-02-14 LAB — BASIC METABOLIC PANEL
BUN / CREAT RATIO: 6 — AB (ref 12–28)
BUN: 8 mg/dL (ref 8–27)
CO2: 24 mmol/L (ref 18–29)
CREATININE: 1.4 mg/dL — AB (ref 0.57–1.00)
Calcium: 9.7 mg/dL (ref 8.7–10.3)
Chloride: 105 mmol/L (ref 96–106)
GFR, EST AFRICAN AMERICAN: 46 mL/min/{1.73_m2} — AB (ref >59)
GFR, EST NON AFRICAN AMERICAN: 40 mL/min/{1.73_m2} — AB (ref >59)
Glucose: 89 mg/dL (ref 65–99)
Potassium: 4.4 mmol/L (ref 3.5–5.2)
SODIUM: 144 mmol/L (ref 134–144)

## 2016-02-14 LAB — HEMOGLOBIN A1C
Est. average glucose Bld gHb Est-mCnc: 120 mg/dL
Hgb A1c MFr Bld: 5.8 % — ABNORMAL HIGH (ref 4.8–5.6)

## 2016-02-14 LAB — TSH: TSH: 0.357 u[IU]/mL — ABNORMAL LOW (ref 0.450–4.500)

## 2016-02-16 ENCOUNTER — Ambulatory Visit: Payer: BLUE CROSS/BLUE SHIELD | Admitting: Family Medicine

## 2016-02-17 ENCOUNTER — Encounter: Payer: Self-pay | Admitting: Family Medicine

## 2016-02-17 ENCOUNTER — Ambulatory Visit (INDEPENDENT_AMBULATORY_CARE_PROVIDER_SITE_OTHER): Payer: BLUE CROSS/BLUE SHIELD | Admitting: Family Medicine

## 2016-02-17 VITALS — BP 128/82 | Ht 60.5 in | Wt 160.4 lb

## 2016-02-17 DIAGNOSIS — E785 Hyperlipidemia, unspecified: Secondary | ICD-10-CM

## 2016-02-17 DIAGNOSIS — N289 Disorder of kidney and ureter, unspecified: Secondary | ICD-10-CM | POA: Diagnosis not present

## 2016-02-17 DIAGNOSIS — I10 Essential (primary) hypertension: Secondary | ICD-10-CM | POA: Diagnosis not present

## 2016-02-17 DIAGNOSIS — R7303 Prediabetes: Secondary | ICD-10-CM | POA: Diagnosis not present

## 2016-02-17 DIAGNOSIS — E039 Hypothyroidism, unspecified: Secondary | ICD-10-CM

## 2016-02-17 NOTE — Progress Notes (Signed)
   Subjective:    Patient ID: Stacy Jensen, female    DOB: Jun 15, 1956, 60 y.o.   MRN: [id number]  Hyperlipidemia This is a chronic problem. The current episode started more than 1 year ago. Pertinent negatives include no chest pain. There are no compliance problems.    Patient has a history of adrenal insufficiency she takes her medicine she sees endocrinologist she sit states that they really don't do anything for her other then to refill her medications and occasionally check cortisol level she would like to follow-up here for future visits Patient had recent labs drawn on 02/13/16. She has a history of renal insufficiency she does try to watch her diet she does try to lose weight but she states it's difficult with her underlying endocrinologic issues She denies any excessive fatigue or tiredness  Has concerns of cough,and sneezing while eating. She states she does not cough or sneeze and other times but when she is eating sometimes she does cough she denies any wheezing difficulty breathing nausea vomiting diarrhea otherwise. Denies food getting stuck in the esophagus. She does take her allergy medicine She does have a history of prediabetes he does try to watch starches in her diet She does have history hyperlipidemia recent lab work reviewed with the patient she does not one to start a statin. Review of Systems  Constitutional: Negative for activity change, appetite change and fatigue.  HENT: Negative for congestion.   Respiratory: Negative for cough.   Cardiovascular: Negative for chest pain.  Gastrointestinal: Negative for abdominal pain.  Endocrine: Negative for polydipsia and polyphagia.  Neurological: Negative for weakness.  Psychiatric/Behavioral: Negative for confusion.       Objective:   Physical Exam  Constitutional: She appears well-nourished. No distress.  Cardiovascular: Normal rate, regular rhythm and normal heart sounds.   No murmur heard. Pulmonary/Chest: Effort  normal and breath sounds normal. No respiratory distress.  Musculoskeletal: She exhibits no edema.  Lymphadenopathy:    She has no cervical adenopathy.  Neurological: She is alert. She exhibits normal muscle tone.  Psychiatric: Her behavior is normal.  Vitals reviewed.   25 minutes was spent with the patient. Greater than half the time was spent in discussion and answering questions and counseling regarding the issues that the patient came in for today.       Assessment & Plan:  Hyperlipidemia continue current medication patient does not want to go on statin currently. She will work hard on diet try to get numbers improved follow-up again in about 6 months  Renal insufficiency stable. Recheck lab work again in 6 months in addition to this talked about low-salt low-protein diet regular physical activity  Hypertension low-salt diet physical activity continue medication  Hypothyroidism adjust thyroid to do none on Sundays and 1 on all other days  Adrenal insufficiency she follows with endocrinology she states they really don't do anything she states she will follow-up with them one more time if there really not doing any other intervention she would prefer to have her care through [place]  Possible regurgitation related issues or swallowing disorder if he gets worse referral to ENT  Patient does agree to repeat her lab work in the fall time.

## 2016-02-17 NOTE — Patient Instructions (Addendum)
No thyroid meds on Sundays  Recheck labs in Oct with a fall follow up  When you are ready to do colonoscopy please let me know

## 2016-02-21 NOTE — Addendum Note (Signed)
Addended by: Launa Grill on: 02/21/2016 08:13 AM   Modules accepted: Medications

## 2016-03-08 ENCOUNTER — Other Ambulatory Visit: Payer: Self-pay | Admitting: Family Medicine

## 2016-03-15 ENCOUNTER — Encounter: Payer: Self-pay | Admitting: Obstetrics & Gynecology

## 2016-04-21 ENCOUNTER — Other Ambulatory Visit: Payer: Self-pay | Admitting: Family Medicine

## 2016-04-23 NOTE — Telephone Encounter (Signed)
90 d plus one ref

## 2016-05-16 ENCOUNTER — Telehealth: Payer: Self-pay | Admitting: Family Medicine

## 2016-05-16 NOTE — Telephone Encounter (Signed)
Pt states that Dr. Nicki Reaper wanted her to call us so that we can order any additional lab work that her endocrinologist may not have ordered  Also said that Dr. Nicki Reaper mentioned her getting a bone density study  Pt states that if all this is ordered she can have it all done at once.    Dr. Chalmers Cater # [phone] in case we have questions for her  (Fx# [phone])  If he wants to order additional labs & bone density, please send those orders to Dr. Almetta Lovely office so that she can get all this done at the same time   Please advise

## 2016-05-20 ENCOUNTER — Encounter: Payer: Self-pay | Admitting: Family Medicine

## 2016-05-20 NOTE — Telephone Encounter (Signed)
Please send letter to [facility]

## 2016-05-20 NOTE — Telephone Encounter (Signed)
A letter will be sent to Dr.Ballen-please inform the patient

## 2016-05-23 DIAGNOSIS — E038 Other specified hypothyroidism: Secondary | ICD-10-CM | POA: Diagnosis not present

## 2016-05-23 DIAGNOSIS — E240 Pituitary-dependent Cushing's disease: Secondary | ICD-10-CM | POA: Diagnosis not present

## 2016-05-23 DIAGNOSIS — E1165 Type 2 diabetes mellitus with hyperglycemia: Secondary | ICD-10-CM | POA: Diagnosis not present

## 2016-05-23 DIAGNOSIS — D443 Neoplasm of uncertain behavior of pituitary gland: Secondary | ICD-10-CM | POA: Diagnosis not present

## 2016-05-30 DIAGNOSIS — I10 Essential (primary) hypertension: Secondary | ICD-10-CM | POA: Diagnosis not present

## 2016-05-30 DIAGNOSIS — D443 Neoplasm of uncertain behavior of pituitary gland: Secondary | ICD-10-CM | POA: Diagnosis not present

## 2016-05-30 DIAGNOSIS — E240 Pituitary-dependent Cushing's disease: Secondary | ICD-10-CM | POA: Diagnosis not present

## 2016-05-30 DIAGNOSIS — E1165 Type 2 diabetes mellitus with hyperglycemia: Secondary | ICD-10-CM | POA: Diagnosis not present

## 2016-06-04 ENCOUNTER — Other Ambulatory Visit: Payer: Self-pay | Admitting: Family Medicine

## 2016-06-14 DIAGNOSIS — Z23 Encounter for immunization: Secondary | ICD-10-CM | POA: Diagnosis not present

## 2016-06-27 ENCOUNTER — Other Ambulatory Visit: Payer: Self-pay | Admitting: Family Medicine

## 2016-06-28 ENCOUNTER — Telehealth: Payer: Self-pay | Admitting: Family Medicine

## 2016-06-28 NOTE — Telephone Encounter (Signed)
Patient was sent a recall letter for her to come in the office in November for a follow up.  She says she was told by Dr. Nicki Reaper that she would not have to be seen until the Spring due to her paperwork being sent over by Dr. Chalmers Cater.  She wants to verify if she needs to come in November or wait until the Spring.

## 2016-06-28 NOTE — Telephone Encounter (Signed)
She can wait until spring but I never received any information regarding her labs from [place] I would prefer this if possible. I believe the patient should consider signing a release to have this sent to [place].

## 2016-06-29 NOTE — Telephone Encounter (Signed)
Notified patient that she can wait until the Spring to be seen.  Also, I notified her to contact Dr. Almetta Lovely office to have them send the labs.

## 2016-09-03 ENCOUNTER — Other Ambulatory Visit: Payer: Self-pay | Admitting: Family Medicine

## 2016-09-05 ENCOUNTER — Other Ambulatory Visit: Payer: Self-pay | Admitting: Family Medicine

## 2016-10-10 ENCOUNTER — Other Ambulatory Visit: Payer: Self-pay | Admitting: Family Medicine

## 2016-10-14 ENCOUNTER — Other Ambulatory Visit: Payer: Self-pay | Admitting: Family Medicine

## 2016-11-13 ENCOUNTER — Other Ambulatory Visit: Payer: Self-pay | Admitting: Family Medicine

## 2016-11-13 NOTE — Telephone Encounter (Signed)
May have one half month prescription-needs office visit

## 2016-11-19 ENCOUNTER — Encounter: Payer: Self-pay | Admitting: Obstetrics & Gynecology

## 2016-11-19 DIAGNOSIS — Z1231 Encounter for screening mammogram for malignant neoplasm of breast: Secondary | ICD-10-CM | POA: Diagnosis not present

## 2016-11-30 ENCOUNTER — Other Ambulatory Visit: Payer: Self-pay | Admitting: Family Medicine

## 2016-12-09 ENCOUNTER — Other Ambulatory Visit: Payer: Self-pay | Admitting: Family Medicine

## 2017-01-09 ENCOUNTER — Ambulatory Visit (INDEPENDENT_AMBULATORY_CARE_PROVIDER_SITE_OTHER): Payer: BLUE CROSS/BLUE SHIELD | Admitting: Family Medicine

## 2017-01-09 ENCOUNTER — Encounter: Payer: Self-pay | Admitting: Family Medicine

## 2017-01-09 VITALS — BP 122/78 | Ht 60.5 in | Wt 158.2 lb

## 2017-01-09 DIAGNOSIS — E038 Other specified hypothyroidism: Secondary | ICD-10-CM | POA: Diagnosis not present

## 2017-01-09 DIAGNOSIS — Z13820 Encounter for screening for osteoporosis: Secondary | ICD-10-CM

## 2017-01-09 DIAGNOSIS — I10 Essential (primary) hypertension: Secondary | ICD-10-CM | POA: Diagnosis not present

## 2017-01-09 DIAGNOSIS — E66811 Obesity, class 1: Secondary | ICD-10-CM

## 2017-01-09 DIAGNOSIS — Z6830 Body mass index (BMI) 30.0-30.9, adult: Secondary | ICD-10-CM | POA: Diagnosis not present

## 2017-01-09 DIAGNOSIS — E784 Other hyperlipidemia: Secondary | ICD-10-CM | POA: Diagnosis not present

## 2017-01-09 DIAGNOSIS — E669 Obesity, unspecified: Secondary | ICD-10-CM

## 2017-01-09 DIAGNOSIS — E271 Primary adrenocortical insufficiency: Secondary | ICD-10-CM

## 2017-01-09 DIAGNOSIS — E7849 Other hyperlipidemia: Secondary | ICD-10-CM

## 2017-01-09 DIAGNOSIS — E240 Pituitary-dependent Cushing's disease: Secondary | ICD-10-CM | POA: Diagnosis not present

## 2017-01-09 MED ORDER — HYDROCORTISONE 10 MG PO TABS: ORAL_TABLET | ORAL | 5 refills | Status: DC

## 2017-01-09 MED ORDER — LEVOTHYROXINE SODIUM 75 MCG PO TABS: 75.0000 ug | ORAL_TABLET | Freq: Every day | ORAL | 5 refills | Status: DC

## 2017-01-09 MED ORDER — FLUTICASONE PROPIONATE 50 MCG/ACT NA SUSP: 1.0000 | Freq: Every day | NASAL | 5 refills | Status: DC

## 2017-01-09 MED ORDER — OXYBUTYNIN CHLORIDE 5 MG PO TABS
5.0000 mg | ORAL_TABLET | Freq: Three times a day (TID) | ORAL | 5 refills | Status: DC
Start: 2017-01-09 — End: 2017-10-07

## 2017-01-09 MED ORDER — LISINOPRIL 5 MG PO TABS: ORAL_TABLET | ORAL | 1 refills | Status: DC

## 2017-01-09 MED ORDER — FENOFIBRATE 160 MG PO TABS: 160.0000 mg | ORAL_TABLET | Freq: Every day | ORAL | 5 refills | Status: DC

## 2017-01-09 NOTE — Progress Notes (Signed)
   Subjective:    Patient ID: Stacy Jensen, female    DOB: 1956/08/09, 61 y.o.   MRN: [id number]  HPI  Patient arrives for a follow up on cholesterol. Patient would like to talk to Dr Nicki Reaper again to see if he would take over management of Cushings Disease so she could stop seeing the endocrinologist. I reviewed over the notes from endocrinologist Dr Vickki Hearing of her endocrinology issues seem to be stable. Patient is trying to lose weight by exercising watching diet Suffers with mild obesity Review of Systems  Constitutional: Negative for activity change, fatigue and fever.  Respiratory: Negative for cough and shortness of breath.   Cardiovascular: Negative for chest pain and leg swelling.  Neurological: Negative for headaches.       Objective:   Physical Exam  Constitutional: She appears well-nourished. No distress.  Cardiovascular: Normal rate, regular rhythm and normal heart sounds.   No murmur heard. Pulmonary/Chest: Effort normal and breath sounds normal. No respiratory distress.  Musculoskeletal: She exhibits no edema.  Lymphadenopathy:    She has no cervical adenopathy.  Neurological: She is alert. She exhibits normal muscle tone.  Psychiatric: Her behavior is normal.  Vitals reviewed.   25 minutes was spent with the patient. Greater than half the time was spent in discussion and answering questions and counseling regarding the issues that the patient came in for today.       Assessment & Plan:  Hyperlipidemia-patient to do the best candidate watching diet sticking with medication previous labs reviewed Ordered  Blood pressure good control continue current measures watch diet  Cushing's hypothyroidism Addison's disease appears to be stable continue current medications check lab work accordingly  Moderate obesity importance of losing weight discuss  Hypothyroidism doing well on medication continue take 6 days a week  I did advise colonoscopy patient defers for now  she will think about it somewhere in the next few months

## 2017-01-10 LAB — PROLACTIN: PROLACTIN: 13.8 ng/mL (ref 4.8–23.3)

## 2017-01-10 LAB — BASIC METABOLIC PANEL
BUN / CREAT RATIO: 11 — AB (ref 12–28)
BUN: 15 mg/dL (ref 8–27)
CO2: 22 mmol/L (ref 18–29)
CREATININE: 1.32 mg/dL — AB (ref 0.57–1.00)
Calcium: 10.1 mg/dL (ref 8.7–10.3)
Chloride: 106 mmol/L (ref 96–106)
GFR calc Af Amer: 50 mL/min/{1.73_m2} — ABNORMAL LOW (ref >59)
GFR calc non Af Amer: 43 mL/min/{1.73_m2} — ABNORMAL LOW (ref >59)
GLUCOSE: 85 mg/dL (ref 65–99)
Potassium: 4.7 mmol/L (ref 3.5–5.2)
Sodium: 145 mmol/L — ABNORMAL HIGH (ref 134–144)

## 2017-01-10 LAB — LIPID PANEL
CHOL/HDL RATIO: 4.7 ratio — AB (ref 0.0–4.4)
Cholesterol, Total: 218 mg/dL — ABNORMAL HIGH (ref 100–199)
HDL: 46 mg/dL (ref >39)
LDL CALC: 132 mg/dL — AB (ref 0–99)
Triglycerides: 200 mg/dL — ABNORMAL HIGH (ref 0–149)
VLDL Cholesterol Cal: 40 mg/dL (ref 5–40)

## 2017-01-10 LAB — TSH: TSH: 0.1 u[IU]/mL — AB (ref 0.450–4.500)

## 2017-01-10 LAB — CORTISOL: Cortisol: 10.2 ug/dL

## 2017-01-10 LAB — ACTH: ACTH: 59.7 pg/mL (ref 7.2–63.3)

## 2017-01-10 LAB — T4, FREE: Free T4: 1.62 ng/dL (ref 0.82–1.77)

## 2017-01-25 ENCOUNTER — Other Ambulatory Visit: Payer: Self-pay

## 2017-01-25 DIAGNOSIS — Z1211 Encounter for screening for malignant neoplasm of colon: Secondary | ICD-10-CM

## 2017-01-30 ENCOUNTER — Encounter (INDEPENDENT_AMBULATORY_CARE_PROVIDER_SITE_OTHER): Payer: Self-pay | Admitting: *Deleted

## 2017-01-30 ENCOUNTER — Encounter: Payer: Self-pay | Admitting: Family Medicine

## 2017-01-31 NOTE — Addendum Note (Signed)
Addended by: Dairl Ponder on: 01/31/2017 09:43 AM   Modules accepted: Orders

## 2017-03-04 ENCOUNTER — Telehealth: Payer: Self-pay | Admitting: Family Medicine

## 2017-03-04 ENCOUNTER — Encounter: Payer: Self-pay | Admitting: Family Medicine

## 2017-03-04 DIAGNOSIS — M85851 Other specified disorders of bone density and structure, right thigh: Secondary | ICD-10-CM | POA: Diagnosis not present

## 2017-03-04 DIAGNOSIS — Z79899 Other long term (current) drug therapy: Secondary | ICD-10-CM | POA: Diagnosis not present

## 2017-03-04 DIAGNOSIS — M810 Age-related osteoporosis without current pathological fracture: Secondary | ICD-10-CM | POA: Diagnosis not present

## 2017-03-04 NOTE — Telephone Encounter (Signed)
Review bone density results in results folder.

## 2017-03-04 NOTE — Telephone Encounter (Signed)
Bone density report recently was not a complete report please request complete bone density report from [facility]-thank you

## 2017-03-10 ENCOUNTER — Encounter: Payer: Self-pay | Admitting: Family Medicine

## 2017-06-30 ENCOUNTER — Other Ambulatory Visit: Payer: Self-pay | Admitting: Family Medicine

## 2017-07-08 ENCOUNTER — Telehealth: Payer: Self-pay | Admitting: Family Medicine

## 2017-07-08 DIAGNOSIS — I10 Essential (primary) hypertension: Secondary | ICD-10-CM

## 2017-07-08 DIAGNOSIS — E7849 Other hyperlipidemia: Secondary | ICD-10-CM

## 2017-07-08 DIAGNOSIS — E038 Other specified hypothyroidism: Secondary | ICD-10-CM

## 2017-07-08 DIAGNOSIS — R7303 Prediabetes: Secondary | ICD-10-CM

## 2017-07-08 NOTE — Telephone Encounter (Signed)
Patient has an appointment on 07/16/17 with Dr. Nicki Reaper.  She is requesting orders for labs.

## 2017-07-10 ENCOUNTER — Ambulatory Visit: Payer: BLUE CROSS/BLUE SHIELD | Admitting: Family Medicine

## 2017-07-10 NOTE — Telephone Encounter (Signed)
Nurse's-please repeat all of the lab work that was listed for the April visit.

## 2017-07-10 NOTE — Telephone Encounter (Signed)
Pt called to check on this. Pt is wanting to go in the morning to have these done. Will be fasting after midnight tonight.

## 2017-07-10 NOTE — Telephone Encounter (Signed)
Spoke with patient and informed her per Dr.Scott Luking- labs have been ordered. Patient verbalized understanding.

## 2017-07-11 ENCOUNTER — Ambulatory Visit: Payer: BLUE CROSS/BLUE SHIELD | Admitting: Family Medicine

## 2017-07-11 DIAGNOSIS — I10 Essential (primary) hypertension: Secondary | ICD-10-CM | POA: Diagnosis not present

## 2017-07-11 DIAGNOSIS — E038 Other specified hypothyroidism: Secondary | ICD-10-CM | POA: Diagnosis not present

## 2017-07-11 DIAGNOSIS — R7303 Prediabetes: Secondary | ICD-10-CM | POA: Diagnosis not present

## 2017-07-11 DIAGNOSIS — E7849 Other hyperlipidemia: Secondary | ICD-10-CM | POA: Diagnosis not present

## 2017-07-11 NOTE — Telephone Encounter (Signed)
Spoke with lab and they will not be able to add the labs to what was drawn so patient will have to be stuck again. One test has to be frozen. Labs ordered. [facility]

## 2017-07-11 NOTE — Telephone Encounter (Signed)
Please also order prolactin, cortisol, and ACTH-if this was already drawn earlier today please make sure that this is added to her lab work thank you

## 2017-07-12 LAB — LIPID PANEL
CHOL/HDL RATIO: 4.5 ratio — AB (ref 0.0–4.4)
CHOLESTEROL TOTAL: 234 mg/dL — AB (ref 100–199)
HDL: 52 mg/dL (ref >39)
LDL CALC: 152 mg/dL — AB (ref 0–99)
Triglycerides: 149 mg/dL (ref 0–149)
VLDL CHOLESTEROL CAL: 30 mg/dL (ref 5–40)

## 2017-07-12 LAB — HEPATIC FUNCTION PANEL
ALT: 18 IU/L (ref 0–32)
AST: 26 IU/L (ref 0–40)
Albumin: 4.6 g/dL (ref 3.6–4.8)
Alkaline Phosphatase: 42 IU/L (ref 39–117)
Bilirubin Total: 0.6 mg/dL (ref 0.0–1.2)
Bilirubin, Direct: 0.17 mg/dL (ref 0.00–0.40)
TOTAL PROTEIN: 7.5 g/dL (ref 6.0–8.5)

## 2017-07-12 LAB — BASIC METABOLIC PANEL
BUN / CREAT RATIO: 9 — AB (ref 12–28)
BUN: 12 mg/dL (ref 8–27)
CHLORIDE: 105 mmol/L (ref 96–106)
CO2: 25 mmol/L (ref 20–29)
Calcium: 10.2 mg/dL (ref 8.7–10.3)
Creatinine, Ser: 1.32 mg/dL — ABNORMAL HIGH (ref 0.57–1.00)
GFR calc non Af Amer: 43 mL/min/{1.73_m2} — ABNORMAL LOW (ref >59)
GFR, EST AFRICAN AMERICAN: 50 mL/min/{1.73_m2} — AB (ref >59)
GLUCOSE: 88 mg/dL (ref 65–99)
POTASSIUM: 4.1 mmol/L (ref 3.5–5.2)
SODIUM: 146 mmol/L — AB (ref 134–144)

## 2017-07-12 LAB — HEMOGLOBIN A1C
ESTIMATED AVERAGE GLUCOSE: 117 mg/dL
HEMOGLOBIN A1C: 5.7 % — AB (ref 4.8–5.6)

## 2017-07-12 LAB — TSH: TSH: 1.34 u[IU]/mL (ref 0.450–4.500)

## 2017-07-12 LAB — T4, FREE: FREE T4: 1.15 ng/dL (ref 0.82–1.77)

## 2017-07-16 ENCOUNTER — Encounter: Payer: Self-pay | Admitting: Family Medicine

## 2017-07-16 ENCOUNTER — Ambulatory Visit (INDEPENDENT_AMBULATORY_CARE_PROVIDER_SITE_OTHER): Payer: BLUE CROSS/BLUE SHIELD | Admitting: Family Medicine

## 2017-07-16 VITALS — BP 132/90 | Ht 60.5 in | Wt 155.1 lb

## 2017-07-16 DIAGNOSIS — S50812A Abrasion of left forearm, initial encounter: Secondary | ICD-10-CM

## 2017-07-16 DIAGNOSIS — Z23 Encounter for immunization: Secondary | ICD-10-CM

## 2017-07-16 DIAGNOSIS — I10 Essential (primary) hypertension: Secondary | ICD-10-CM

## 2017-07-16 DIAGNOSIS — E038 Other specified hypothyroidism: Secondary | ICD-10-CM | POA: Diagnosis not present

## 2017-07-16 DIAGNOSIS — W548XXA Other contact with dog, initial encounter: Secondary | ICD-10-CM

## 2017-07-16 DIAGNOSIS — T148XXA Other injury of unspecified body region, initial encounter: Secondary | ICD-10-CM

## 2017-07-16 NOTE — Telephone Encounter (Signed)
Patient had the blood work drawn this am.

## 2017-07-16 NOTE — Progress Notes (Signed)
   Subjective:    Patient ID: Stacy Jensen, female    DOB: August 26, 1956, 61 y.o.   MRN: [id number]  Hyperlipidemia  This is a chronic problem. The current episode started more than 1 year ago. Pertinent negatives include no chest pain. She is currently on no antihyperlipidemic treatment. The current treatment provides mild improvement of lipids. There are no compliance problems.  Risk factors for coronary artery disease include dyslipidemia.  Hypertension  This is a chronic problem. The current episode started more than 1 year ago. The problem is unchanged. The problem is controlled. Pertinent negatives include no chest pain, neck pain or orthopnea. There are no associated agents to hypertension. Risk factors for coronary artery disease include dyslipidemia. Past treatments include ACE inhibitors. There are no compliance problems.  There is no history of angina, CVA or heart failure.   Patient also has concerns of leg cramps. Intermittent sometimes spasms sometimes doesn't she does try stretching  She does have Addison's disease related to Cushing's which she had surgery for she does take her steroids on a regular basis she will be doing her blood work later today  Also with thyroid she takes her medication 5 times per week and tries keep it under good control  Prediabetes she does try to watch starches and sugars in her diet denies neuropathy in her feet     Review of Systems  Constitutional: Negative for activity change, appetite change and fatigue.  HENT: Negative for congestion.   Respiratory: Negative for cough.   Cardiovascular: Negative for chest pain and orthopnea.  Gastrointestinal: Negative for abdominal pain.  Endocrine: Negative for polydipsia and polyphagia.  Musculoskeletal: Negative for neck pain.  Skin: Negative for color change.  Neurological: Negative for weakness.  Psychiatric/Behavioral: Negative for confusion.       Objective:   Physical Exam  Constitutional: She  appears well-developed and well-nourished. No distress.  HENT:  Head: Normocephalic and atraumatic.  Eyes: Right eye exhibits no discharge. Left eye exhibits no discharge.  Neck: No tracheal deviation present.  Cardiovascular: Normal rate, regular rhythm and normal heart sounds.   No murmur heard. Pulmonary/Chest: Effort normal and breath sounds normal. No respiratory distress. She has no wheezes. She has no rales.  Musculoskeletal: She exhibits no edema.  Lymphadenopathy:    She has no cervical adenopathy.  Neurological: She is alert. She exhibits normal muscle tone.  Skin: Skin is warm and dry. No erythema.  Psychiatric: Her behavior is normal.  Vitals reviewed.   Abrasion on the left arm from a dog scratch from a few days ago      Assessment & Plan:  Hypertension good control continue current measures Mild obesity patient was encouraged watch diet try lose weight Addison's disease continue steroid supplementation check lab work await results Hyperthyroidism TSH looks good continue thyroid medicine 5 days per week Prediabetes overall good control patient to watch his sugars in her diet Abrasion on the left arm tetanus shot today occurred from a dog scratch a few days ago Hyperlipidemia patient does not one of be on statins she cannot afford injectables patient will follow-up in 6 months

## 2017-07-17 ENCOUNTER — Encounter: Payer: Self-pay | Admitting: Family Medicine

## 2017-07-17 LAB — PROLACTIN: Prolactin: 14.4 ng/mL (ref 4.8–23.3)

## 2017-07-17 LAB — ACTH: ACTH: 46.6 pg/mL (ref 7.2–63.3)

## 2017-07-17 LAB — CORTISOL: Cortisol: 9.6 ug/dL

## 2017-08-04 ENCOUNTER — Other Ambulatory Visit: Payer: Self-pay | Admitting: Family Medicine

## 2017-08-22 ENCOUNTER — Other Ambulatory Visit: Payer: Self-pay | Admitting: Family Medicine

## 2017-09-26 ENCOUNTER — Other Ambulatory Visit: Payer: Self-pay | Admitting: *Deleted

## 2017-09-26 MED ORDER — LEVOTHYROXINE SODIUM 75 MCG PO TABS: 75.0000 ug | ORAL_TABLET | Freq: Every day | ORAL | 5 refills | Status: DC

## 2017-10-07 ENCOUNTER — Other Ambulatory Visit: Payer: Self-pay | Admitting: Family Medicine

## 2017-11-26 DIAGNOSIS — Z1231 Encounter for screening mammogram for malignant neoplasm of breast: Secondary | ICD-10-CM | POA: Diagnosis not present

## 2018-01-06 ENCOUNTER — Other Ambulatory Visit: Payer: Self-pay | Admitting: Family Medicine

## 2018-01-14 ENCOUNTER — Ambulatory Visit: Payer: BLUE CROSS/BLUE SHIELD | Admitting: Family Medicine

## 2018-01-27 ENCOUNTER — Other Ambulatory Visit: Payer: Self-pay | Admitting: Family Medicine

## 2018-01-27 ENCOUNTER — Telehealth: Payer: Self-pay | Admitting: Family Medicine

## 2018-01-27 DIAGNOSIS — E249 Cushing's syndrome, unspecified: Secondary | ICD-10-CM

## 2018-01-27 DIAGNOSIS — R7303 Prediabetes: Secondary | ICD-10-CM

## 2018-01-27 DIAGNOSIS — E271 Primary adrenocortical insufficiency: Secondary | ICD-10-CM

## 2018-01-27 DIAGNOSIS — Z79899 Other long term (current) drug therapy: Secondary | ICD-10-CM

## 2018-01-27 DIAGNOSIS — E039 Hypothyroidism, unspecified: Secondary | ICD-10-CM

## 2018-01-27 DIAGNOSIS — E785 Hyperlipidemia, unspecified: Secondary | ICD-10-CM

## 2018-01-27 DIAGNOSIS — I10 Essential (primary) hypertension: Secondary | ICD-10-CM

## 2018-01-27 NOTE — Telephone Encounter (Signed)
Refill all times this and 6 refills

## 2018-01-27 NOTE — Telephone Encounter (Signed)
Patient has appointment on 5/15 and needing labs done she is requesting Cortizone levels be done also.

## 2018-01-28 NOTE — Telephone Encounter (Signed)
TSH, lipid, liver, R9U, metabolic 7, cortisol level, ACTH, prolactin

## 2018-01-29 NOTE — Telephone Encounter (Signed)
Spoke with patient and patient states she is going to be in [place] until Friday and states she will go to [facility] there and have labs drawn

## 2018-01-29 NOTE — Telephone Encounter (Signed)
The wireless customer not available per vm. Unable to leave a message. Orders in system,may go for them.

## 2018-01-29 NOTE — Addendum Note (Signed)
Addended by: Karle Barr on: 01/29/2018 08:56 AM   Modules accepted: Orders

## 2018-01-30 DIAGNOSIS — E271 Primary adrenocortical insufficiency: Secondary | ICD-10-CM | POA: Diagnosis not present

## 2018-01-30 DIAGNOSIS — E039 Hypothyroidism, unspecified: Secondary | ICD-10-CM | POA: Diagnosis not present

## 2018-01-30 DIAGNOSIS — R7303 Prediabetes: Secondary | ICD-10-CM | POA: Diagnosis not present

## 2018-01-30 DIAGNOSIS — Z79899 Other long term (current) drug therapy: Secondary | ICD-10-CM | POA: Diagnosis not present

## 2018-01-30 DIAGNOSIS — I10 Essential (primary) hypertension: Secondary | ICD-10-CM | POA: Diagnosis not present

## 2018-01-30 DIAGNOSIS — E249 Cushing's syndrome, unspecified: Secondary | ICD-10-CM | POA: Diagnosis not present

## 2018-01-31 LAB — LIPID PANEL
CHOL/HDL RATIO: 4.1 ratio (ref 0.0–4.4)
Cholesterol, Total: 193 mg/dL (ref 100–199)
HDL: 47 mg/dL (ref >39)
LDL CALC: 118 mg/dL — AB (ref 0–99)
Triglycerides: 142 mg/dL (ref 0–149)
VLDL CHOLESTEROL CAL: 28 mg/dL (ref 5–40)

## 2018-01-31 LAB — HEPATIC FUNCTION PANEL
ALBUMIN: 4.4 g/dL (ref 3.6–4.8)
ALK PHOS: 80 IU/L (ref 39–117)
ALT: 22 IU/L (ref 0–32)
AST: 37 IU/L (ref 0–40)
Bilirubin Total: 0.6 mg/dL (ref 0.0–1.2)
Bilirubin, Direct: 0.19 mg/dL (ref 0.00–0.40)
TOTAL PROTEIN: 7.1 g/dL (ref 6.0–8.5)

## 2018-01-31 LAB — BASIC METABOLIC PANEL
BUN/Creatinine Ratio: 12 (ref 12–28)
BUN: 18 mg/dL (ref 8–27)
CO2: 20 mmol/L (ref 20–29)
CREATININE: 1.54 mg/dL — AB (ref 0.57–1.00)
Calcium: 9.6 mg/dL (ref 8.7–10.3)
Chloride: 107 mmol/L — ABNORMAL HIGH (ref 96–106)
GFR, EST AFRICAN AMERICAN: 41 mL/min/{1.73_m2} — AB (ref >59)
GFR, EST NON AFRICAN AMERICAN: 35 mL/min/{1.73_m2} — AB (ref >59)
Glucose: 80 mg/dL (ref 65–99)
POTASSIUM: 4.3 mmol/L (ref 3.5–5.2)
Sodium: 145 mmol/L — ABNORMAL HIGH (ref 134–144)

## 2018-01-31 LAB — TSH: TSH: 1.73 u[IU]/mL (ref 0.450–4.500)

## 2018-01-31 LAB — PROLACTIN: PROLACTIN: 20.3 ng/mL (ref 4.8–23.3)

## 2018-01-31 LAB — HEMOGLOBIN A1C
Est. average glucose Bld gHb Est-mCnc: 117 mg/dL
Hgb A1c MFr Bld: 5.7 % — ABNORMAL HIGH (ref 4.8–5.6)

## 2018-01-31 LAB — CORTISOL: Cortisol: 13.7 ug/dL

## 2018-01-31 LAB — ACTH: ACTH: 53.7 pg/mL (ref 7.2–63.3)

## 2018-02-04 ENCOUNTER — Telehealth: Payer: Self-pay | Admitting: Family Medicine

## 2018-02-04 ENCOUNTER — Encounter: Payer: Self-pay | Admitting: Family Medicine

## 2018-02-04 ENCOUNTER — Other Ambulatory Visit: Payer: Self-pay | Admitting: *Deleted

## 2018-02-04 ENCOUNTER — Ambulatory Visit (HOSPITAL_COMMUNITY)
Admission: RE | Admit: 2018-02-04 | Discharge: 2018-02-04 | Disposition: A | Discharge status: Home / Self Care | Payer: BLUE CROSS/BLUE SHIELD | Source: Ambulatory Visit | Attending: Family Medicine | Admitting: Family Medicine

## 2018-02-04 ENCOUNTER — Ambulatory Visit: Payer: BLUE CROSS/BLUE SHIELD | Admitting: Family Medicine

## 2018-02-04 VITALS — BP 134/86 | Ht 60.5 in | Wt 157.2 lb

## 2018-02-04 DIAGNOSIS — M25471 Effusion, right ankle: Secondary | ICD-10-CM | POA: Diagnosis not present

## 2018-02-04 DIAGNOSIS — E038 Other specified hypothyroidism: Secondary | ICD-10-CM

## 2018-02-04 DIAGNOSIS — R7303 Prediabetes: Secondary | ICD-10-CM | POA: Diagnosis not present

## 2018-02-04 DIAGNOSIS — S93401A Sprain of unspecified ligament of right ankle, initial encounter: Secondary | ICD-10-CM | POA: Insufficient documentation

## 2018-02-04 DIAGNOSIS — I10 Essential (primary) hypertension: Secondary | ICD-10-CM

## 2018-02-04 DIAGNOSIS — N3281 Overactive bladder: Secondary | ICD-10-CM

## 2018-02-04 DIAGNOSIS — E7849 Other hyperlipidemia: Secondary | ICD-10-CM | POA: Diagnosis not present

## 2018-02-04 DIAGNOSIS — M7989 Other specified soft tissue disorders: Secondary | ICD-10-CM | POA: Diagnosis not present

## 2018-02-04 DIAGNOSIS — M25571 Pain in right ankle and joints of right foot: Secondary | ICD-10-CM | POA: Diagnosis not present

## 2018-02-04 DIAGNOSIS — X58XXXA Exposure to other specified factors, initial encounter: Secondary | ICD-10-CM | POA: Insufficient documentation

## 2018-02-04 MED ORDER — TRAMADOL HCL 50 MG PO TABS: ORAL_TABLET | ORAL | 0 refills | Status: DC

## 2018-02-04 MED ORDER — LISINOPRIL 5 MG PO TABS: ORAL_TABLET | ORAL | 1 refills | Status: DC

## 2018-02-04 NOTE — Telephone Encounter (Signed)
Requesting Rx for Tramadol to [facility].  She said that Dr. Nicki Reaper told her to call back if needed.

## 2018-02-04 NOTE — Patient Instructions (Signed)
Results for orders placed or performed in visit on 01/27/18  ACTH  Result Value Ref Range   ACTH 53.7 7.2 - 63.3 pg/mL  Prolactin  Result Value Ref Range   Prolactin 20.3 4.8 - 23.3 ng/mL  Cortisol  Result Value Ref Range   Cortisol 13.7 ug/dL  TSH  Result Value Ref Range   TSH 1.730 0.450 - 4.500 uIU/mL  Hepatic function panel  Result Value Ref Range   Total Protein 7.1 6.0 - 8.5 g/dL   Albumin 4.4 3.6 - 4.8 g/dL   Bilirubin Total 0.6 0.0 - 1.2 mg/dL   Bilirubin, Direct 0.19 0.00 - 0.40 mg/dL   Alkaline Phosphatase 80 39 - 117 IU/L   AST 37 0 - 40 IU/L   ALT 22 0 - 32 IU/L  Lipid panel  Result Value Ref Range   Cholesterol, Total 193 100 - 199 mg/dL   Triglycerides 142 0 - 149 mg/dL   HDL 47 >39 mg/dL   VLDL Cholesterol Cal 28 5 - 40 mg/dL   LDL Calculated 118 (H) 0 - 99 mg/dL   Chol/HDL Ratio 4.1 0.0 - 4.4 ratio  Hemoglobin A1c  Result Value Ref Range   Hgb A1c MFr Bld 5.7 (H) 4.8 - 5.6 %   Est. average glucose Bld gHb Est-mCnc 117 mg/dL  Basic metabolic panel  Result Value Ref Range   Glucose 80 65 - 99 mg/dL   BUN 18 8 - 27 mg/dL   Creatinine, Ser 1.54 (H) 0.57 - 1.00 mg/dL   GFR calc non Af Amer 35 (L) >59 mL/min/1.73   GFR calc Af Amer 41 (L) >59 mL/min/1.73   BUN/Creatinine Ratio 12 12 - 28   Sodium 145 (H) 134 - 144 mmol/L   Potassium 4.3 3.5 - 5.2 mmol/L   Chloride 107 (H) 96 - 106 mmol/L   CO2 20 20 - 29 mmol/L   Calcium 9.6 8.7 - 10.3 mg/dL

## 2018-02-04 NOTE — Telephone Encounter (Signed)
Discussed with pt. Med sent to pharm.  

## 2018-02-04 NOTE — Telephone Encounter (Signed)
#  20Tramadol 50 mg 1 numberEvery 8 hours as needed pain cautioned drowsiness for home use only not when driving [id number] can let us know how that does for her

## 2018-02-04 NOTE — Telephone Encounter (Signed)
Seen today. 

## 2018-02-04 NOTE — Progress Notes (Signed)
   Subjective:    Patient ID: Stacy Jensen, female    DOB: 1956/07/11, 62 y.o.   MRN: [id number]  Hypertension  This is a chronic problem. Pertinent negatives include no chest pain or headaches. There are no compliance problems.    Pt here for 6 month follow up on HTN, hypothyroidism. Pt also states that she hurt her right foot last week. Soreness is in ankle area. Pt was standing on a stool and when she went to step down, the stool gave way and pt fell.    Review of Systems  Constitutional: Negative for activity change, appetite change and fatigue.  HENT: Negative for congestion and rhinorrhea.   Respiratory: Negative for cough and choking.   Cardiovascular: Negative for chest pain.  Gastrointestinal: Negative for abdominal pain and constipation.  Endocrine: Negative for polydipsia and polyphagia.  Genitourinary: Negative for dysuria and frequency.  Skin: Negative for color change.  Neurological: Negative for dizziness, weakness and headaches.  Psychiatric/Behavioral: Negative for confusion.       Objective:   Physical Exam  Constitutional: She appears well-developed and well-nourished. No distress.  HENT:  Head: Normocephalic and atraumatic.  Eyes: Conjunctivae are normal. Right eye exhibits no discharge. Left eye exhibits no discharge.  Cardiovascular: Normal rate, regular rhythm and normal heart sounds.  No murmur heard. Pulmonary/Chest: Effort normal and breath sounds normal. No respiratory distress.  Musculoskeletal: She exhibits no edema.  Lymphadenopathy:    She has no cervical adenopathy.  Neurological: She is alert. She exhibits normal muscle tone.  Psychiatric: She has a normal mood and affect. Her behavior is normal.  Vitals reviewed. Ankle has swelling in the mid ankle more on the outside and the inside also moderate mid ankle swelling as well as bruising decreased range of motion.  Patient with significant limp  25 minutes was spent with the patient.  This  statement verifies that 25 minutes was indeed spent with the patient. Greater than half the time was spent in discussion, counseling and answering questions  regarding the issues that the patient came in for today as reflected in the diagnosis (s) please refer to documentation for further details.  Mild exercises shown for the ankle x-ray indicated     Assessment & Plan:  Blood pressure good control continue current measures watch salt diet stay physically active  Hypothyroidism blood work looks good continue current measures.  She takes the medication 5 days a week  Overactive bladder continue medication let us know if any ongoing troubles  Prediabetes very important minimize starches in diet stay physically active try to bring weight down  Significant hyperlipidemia continue fenofibrate keeping it under good control watching diet  Sprain of the right ankle could be high ankle sprain cannot rule out the possibility of fibular fracture x-rays indicated sent today  Mild obesity encourage patient try to lose weight

## 2018-04-19 ENCOUNTER — Other Ambulatory Visit: Payer: Self-pay | Admitting: Family Medicine

## 2018-04-21 ENCOUNTER — Telehealth: Payer: Self-pay | Admitting: Family Medicine

## 2018-04-21 NOTE — Telephone Encounter (Signed)
Pt is calling in wanting some advice from Dr. Nicki Reaper regarding her Right ankle. She is still having trouble walking on it and it is still swelling when she is up walking around. She is wanting to know if a repeat of the xray is needed or if a referral needs to be placed.

## 2018-04-21 NOTE — Telephone Encounter (Signed)
We will see her at her appointment

## 2018-04-21 NOTE — Telephone Encounter (Signed)
Pt states that she has been wearing compression stockings. Was using ice but ice makes it worse. Ankle is not swelling as much. Advised patient to schedule an office visit.

## 2018-04-22 ENCOUNTER — Ambulatory Visit: Payer: BLUE CROSS/BLUE SHIELD | Admitting: Family Medicine

## 2018-04-22 ENCOUNTER — Encounter: Payer: Self-pay | Admitting: Family Medicine

## 2018-04-22 VITALS — BP 132/82 | Ht 60.5 in | Wt 156.0 lb

## 2018-04-22 DIAGNOSIS — M25571 Pain in right ankle and joints of right foot: Secondary | ICD-10-CM | POA: Diagnosis not present

## 2018-04-22 DIAGNOSIS — S93491S Sprain of other ligament of right ankle, sequela: Secondary | ICD-10-CM

## 2018-04-22 NOTE — Progress Notes (Signed)
   Subjective:    Patient ID: Stacy Jensen, female    DOB: May 12, 1956, 62 y.o.   MRN: [id number]  HPI  Patient arrives with continued problems with right ankle pain and swelling since spraining it 2 months ago. Please see previous note High ankle sprain several weeks back She has not recovered her range of motion She relates pain and discomfort when she walks She had previous x-ray which looked good.  Having pain in the ankle region mainly because of the weakness in her foot she has a hard time flexing her foot x-ray was reviewed with the patient patient does not want to do further x-rays but is open to other treatment denies any previous trouble with this Review of Systems  Constitutional: Negative for activity change, fatigue and fever.  HENT: Negative for congestion and rhinorrhea.   Respiratory: Negative for cough, chest tightness and shortness of breath.   Cardiovascular: Negative for chest pain and leg swelling.  Gastrointestinal: Negative for abdominal pain and nausea.  Musculoskeletal: Positive for joint swelling. Negative for myalgias.  Skin: Negative for color change.  Neurological: Negative for dizziness and headaches.  Psychiatric/Behavioral: Negative for agitation and behavioral problems.       Objective:   Physical Exam  Constitutional: She appears well-nourished. No distress.  HENT:  Head: Normocephalic and atraumatic.  Eyes: Right eye exhibits no discharge. Left eye exhibits no discharge.  Cardiovascular: Normal rate, regular rhythm and normal heart sounds.  No murmur heard. Pulmonary/Chest: Effort normal and breath sounds normal.  Musculoskeletal: She exhibits no edema.  Neurological: She is alert. Coordination normal.  Psychiatric: Her behavior is normal.  Vitals reviewed.  Patient has a very tight right ankle is her left ankle is overall good there is some swelling in the ankle not severe there is no bone pain       Assessment & Plan:  Ankle sprain with  poor mobility needs physical therapy Hold off on x-ray MRI at this point Hold off on Ortho Stretching exercises were shown referral to physical therapy Patient prefers physical therapy and aspirin [place] she will call us with the name of 1 to send her to

## 2018-04-22 NOTE — Patient Instructions (Signed)
We will set you up with the PT  Please call us with the place

## 2018-05-07 ENCOUNTER — Encounter: Payer: Self-pay | Admitting: Family Medicine

## 2018-05-17 ENCOUNTER — Other Ambulatory Visit: Payer: Self-pay | Admitting: Family Medicine

## 2018-06-16 ENCOUNTER — Other Ambulatory Visit: Payer: Self-pay | Admitting: Family Medicine

## 2018-07-18 ENCOUNTER — Other Ambulatory Visit: Payer: Self-pay | Admitting: Family Medicine

## 2018-08-04 ENCOUNTER — Ambulatory Visit: Payer: BLUE CROSS/BLUE SHIELD | Admitting: Family Medicine

## 2018-08-18 ENCOUNTER — Other Ambulatory Visit: Payer: Self-pay | Admitting: Family Medicine

## 2018-08-29 ENCOUNTER — Telehealth: Payer: Self-pay | Admitting: Family Medicine

## 2018-08-29 DIAGNOSIS — M79673 Pain in unspecified foot: Secondary | ICD-10-CM

## 2018-08-29 NOTE — Telephone Encounter (Signed)
Tried calling pt to get more info no answer.

## 2018-08-29 NOTE — Telephone Encounter (Signed)
Injured (R) foot back in April 2019, pt states xray was taken at this time that came back negative. Pt states pain eased and now pain is back x 6wks, pt states her foot feels like "oatmeal". Pt would like a referral to a foot specialist. Advise.

## 2018-08-29 NOTE — Telephone Encounter (Signed)
Referral placed in epic. Tried calling vm not set up.

## 2018-08-29 NOTE — Telephone Encounter (Signed)
Please go ahead with referral to [facility]

## 2018-09-02 NOTE — Telephone Encounter (Signed)
Per referral coordinator: Pt prefers the [place] location Referral sent to their "Q", they contact pt Called pt & notified referral sent, they will contact pt by phone or mail

## 2018-09-11 ENCOUNTER — Telehealth: Payer: Self-pay | Admitting: Family Medicine

## 2018-09-11 ENCOUNTER — Other Ambulatory Visit: Payer: Self-pay | Admitting: Family Medicine

## 2018-09-11 DIAGNOSIS — I10 Essential (primary) hypertension: Secondary | ICD-10-CM

## 2018-09-11 DIAGNOSIS — Z79899 Other long term (current) drug therapy: Secondary | ICD-10-CM

## 2018-09-11 DIAGNOSIS — E7849 Other hyperlipidemia: Secondary | ICD-10-CM

## 2018-09-11 DIAGNOSIS — R7303 Prediabetes: Secondary | ICD-10-CM

## 2018-09-11 DIAGNOSIS — E249 Cushing's syndrome, unspecified: Secondary | ICD-10-CM

## 2018-09-11 NOTE — Telephone Encounter (Signed)
Pt has an appt with Dr.Scott 09/18/2018 for 6 month follow up and is requesting blood work, pt states to make sure Cortisol level is included.

## 2018-09-11 NOTE — Telephone Encounter (Signed)
Patient is aware 

## 2018-09-11 NOTE — Telephone Encounter (Signed)
rep live lip met 7 A1c and cortisol

## 2018-09-11 NOTE — Telephone Encounter (Signed)
Pt contacted and informed that Dr.Scott is out of the town until next week. Pt has appt on 09/18/18 and pt would like to have blood work done before hand. Last labs on 01/30/18 were ACTH, Prolactin, Cortisol, TSH, Hepatic, Lipid, A1C and BMET. Please advise. Thank you.

## 2018-09-11 NOTE — Telephone Encounter (Signed)
Left message to return call. Labs ordered in [employer]

## 2018-09-12 ENCOUNTER — Encounter: Payer: Self-pay | Admitting: Sports Medicine

## 2018-09-12 ENCOUNTER — Ambulatory Visit (INDEPENDENT_AMBULATORY_CARE_PROVIDER_SITE_OTHER): Payer: BLUE CROSS/BLUE SHIELD

## 2018-09-12 ENCOUNTER — Ambulatory Visit: Payer: BLUE CROSS/BLUE SHIELD | Admitting: Sports Medicine

## 2018-09-12 ENCOUNTER — Telehealth: Payer: Self-pay | Admitting: *Deleted

## 2018-09-12 VITALS — BP 154/84 | HR 85 | Resp 16 | Ht 60.0 in | Wt 150.0 lb

## 2018-09-12 DIAGNOSIS — E249 Cushing's syndrome, unspecified: Secondary | ICD-10-CM | POA: Diagnosis not present

## 2018-09-12 DIAGNOSIS — S93401A Sprain of unspecified ligament of right ankle, initial encounter: Secondary | ICD-10-CM

## 2018-09-12 DIAGNOSIS — S96911A Strain of unspecified muscle and tendon at ankle and foot level, right foot, initial encounter: Secondary | ICD-10-CM

## 2018-09-12 DIAGNOSIS — M25571 Pain in right ankle and joints of right foot: Secondary | ICD-10-CM

## 2018-09-12 DIAGNOSIS — R7303 Prediabetes: Secondary | ICD-10-CM | POA: Diagnosis not present

## 2018-09-12 DIAGNOSIS — Z79899 Other long term (current) drug therapy: Secondary | ICD-10-CM | POA: Diagnosis not present

## 2018-09-12 DIAGNOSIS — M779 Enthesopathy, unspecified: Secondary | ICD-10-CM | POA: Diagnosis not present

## 2018-09-12 DIAGNOSIS — E7849 Other hyperlipidemia: Secondary | ICD-10-CM | POA: Diagnosis not present

## 2018-09-12 DIAGNOSIS — I10 Essential (primary) hypertension: Secondary | ICD-10-CM | POA: Diagnosis not present

## 2018-09-12 MED ORDER — METHYLPREDNISOLONE 4 MG PO TBPK: ORAL_TABLET | ORAL | 0 refills | Status: DC

## 2018-09-12 NOTE — Progress Notes (Signed)
Subjective:  Stacy Jensen is a 62 y.o. female patient who presents to office for evaluation of right ankle pain. Patient complains of continued pain in the ankle since April after she fell off a stool and landed on her foot. Patient has tried ankle brace, compression sleeves, icing heat Tylenol and also saw her primary care doctor who extubate and recommend that she try physical therapy but she never went with no relief in symptoms. Patient states that it hurts to bend her ankle and that it feels like there is something inside her ankle joint, patient states that her foot rolls to the side when she is walking and pain is worse along her ankle and her heel and arch when she attempts to walk states that the pain is making her limp and is driving her crazy.  No other pedal complaints noted.  Review of Systems  Musculoskeletal: Positive for joint pain and myalgias.       Gait problem  All other systems reviewed and are negative.    Patient Active Problem List   Diagnosis Date Noted  . Osteopenia 11/20/2015  . Adrenal insufficiency (Addison's disease) ([place]) 11/13/2015  . Pituitary Cushing's syndrome ([place]) 11/13/2015  . Prediabetes 10/19/2015  . Renal insufficiency 11/22/2014  . Obesity 11/22/2014  . Benign paroxysmal positional vertigo 06/14/2014  . Postmenopausal bleeding 11/12/2013  . Hypothyroid 09/04/2013  . Overactive bladder 09/04/2013  . Hypertriglyceridemia 02/05/2013  . Hyperlipidemia 02/05/2013  . Essential hypertension, benign 02/05/2013  . Cushing syndrome ([place]) 02/05/2013    Current Outpatient Medications on File Prior to Visit  Medication Sig Dispense Refill  . Calcium Citrate (CITRACAL PO) Take by mouth daily.    . Chlorpheniramine Maleate (ALLERGY RELIEF PO) Take by mouth.    . co-enzyme Q-10 30 MG capsule Take 30 mg by mouth 3 (three) times daily.    . fenofibrate 160 MG tablet TAKE 1 TABLET BY MOUTH ONCE DAILY 30 tablet 4  . fluticasone (FLONASE) 50 MCG/ACT nasal  spray USE 1 SPRAY(S) IN EACH NOSTRIL ONCE DAILY 16 g 5  . hydrocortisone (CORTEF) 10 MG tablet  TAKE 2 TABLETS BY MOUTH IN THE MORNING AND 1 TABLET  IN THE EVENING 90 tablet 1  . levothyroxine (SYNTHROID, LEVOTHROID) 75 MCG tablet TAKE 1 TABLET BY MOUTH ONCE DAILY TAKE  6  DAYS  A  WEEK 30 tablet 5  . lisinopril (PRINIVIL,ZESTRIL) 5 MG tablet TAKE 1 & 1/2 (ONE & ONE-HALF) TABLETS BY MOUTH ONCE DAILY 135 tablet 1  . Multiple Vitamins-Minerals (MULTIVITAMIN WITH MINERALS) tablet Take 1 tablet by mouth daily.    . Omega-3 Fatty Acids (OMEGA-3 FISH OIL) 1200 MG CAPS Take by mouth daily.     Marland Kitchen oxybutynin (DITROPAN) 5 MG tablet TAKE 1 TABLET BY MOUTH THREE TIMES DAILY 90 tablet 3   No current facility-administered medications on file prior to visit.     Allergies  Allergen Reactions  . Codeine Nausea And Vomiting    Objective:  General: Alert and oriented x3 in no acute distress  Dermatology: No open lesions bilateral lower extremities, no webspace macerations, no ecchymosis bilateral, all nails x 10 are well manicured.  Vascular: Dorsalis Pedis and Posterior Tibial pedal pulses palpable, Capillary Fill Time 3 seconds,(+) pedal hair growth bilateral, no edema bilateral lower extremities, Temperature gradient within normal limits.  Neurology: Johney Maine sensation intact via light touch bilateral.  Musculoskeletal: Mild tenderness with palpation at lateral ankle and inferior heel on right. Negative talar tilt, Negative tib-fib stress, No instability.  No pain with calf compression bilateral. Range of motion within normal limits with mild guarding on right ankle versus resistance. Strength within normal limits in all groups bilateral.   Gait: Antalgic gait  Xrays  Right ankle   Impression: Early arthritis ankle, mild soft tissue swelling, no acute fracture or dislocation, no other acute findings.  Assessment and Plan: Problem List Items Addressed This Visit    None    Visit Diagnoses    Acute  right ankle pain    -  Primary   Relevant Medications   methylPREDNISolone (MEDROL DOSEPAK) 4 MG TBPK tablet   Other Relevant Orders   DG Ankle Complete Right   Tendonitis       Relevant Medications   methylPREDNISolone (MEDROL DOSEPAK) 4 MG TBPK tablet   Sprain of right ankle, unspecified ligament, initial encounter       Tear of tendon of right ankle, initial encounter           -Complete examination performed -Xrays reviewed -Discussed treatement options right ankle pain tendinitis sprain versus tear -Rx Medrol Dosepak to take in addition to her current steroid regimen for her Cushing's disease -Dispensed cam boot to wear to protect ankle until her MRI has been completed.  Advised patient if her MRI is negative we will move forward with recommendation of physical therapy.  Patient to call office after MRI is completed for follow-up appointment for discussion of results in person or over the phone. -Patient to return to office after MRI or sooner if condition worsens.  Landis Martins, DPM

## 2018-09-12 NOTE — Progress Notes (Signed)
   Subjective:    Patient ID: Stacy Jensen, female    DOB: 1956-04-17, 62 y.o.   MRN: [id number]  HPI    Review of Systems  Musculoskeletal: Positive for arthralgias, gait problem, joint swelling and myalgias.  All other systems reviewed and are negative.      Objective:   Physical Exam        Assessment & Plan:

## 2018-09-12 NOTE — Telephone Encounter (Signed)
-----   Message from Landis Martins, [place] sent at 09/12/2018  9:40 AM EST ----- Regarding: MRI R ankle Pain in Ankle since April after falling of a stool. Xrays negative

## 2018-09-12 NOTE — Telephone Encounter (Signed)
Orders to J. Quintana, RN for pre-cert. 

## 2018-09-13 LAB — HEPATIC FUNCTION PANEL
ALT: 13 IU/L (ref 0–32)
AST: 19 IU/L (ref 0–40)
Albumin: 4 g/dL (ref 3.6–4.8)
Alkaline Phosphatase: 48 IU/L (ref 39–117)
Bilirubin Total: 0.3 mg/dL (ref 0.0–1.2)
Bilirubin, Direct: 0.11 mg/dL (ref 0.00–0.40)
Total Protein: 7.3 g/dL (ref 6.0–8.5)

## 2018-09-13 LAB — LIPID PANEL
Chol/HDL Ratio: 5.2 ratio — ABNORMAL HIGH (ref 0.0–4.4)
Cholesterol, Total: 220 mg/dL — ABNORMAL HIGH (ref 100–199)
HDL: 42 mg/dL (ref >39)
LDL CALC: 132 mg/dL — AB (ref 0–99)
Triglycerides: 231 mg/dL — ABNORMAL HIGH (ref 0–149)
VLDL Cholesterol Cal: 46 mg/dL — ABNORMAL HIGH (ref 5–40)

## 2018-09-13 LAB — BASIC METABOLIC PANEL
BUN/Creatinine Ratio: 11 — ABNORMAL LOW (ref 12–28)
BUN: 15 mg/dL (ref 8–27)
CO2: 22 mmol/L (ref 20–29)
Calcium: 9.9 mg/dL (ref 8.7–10.3)
Chloride: 104 mmol/L (ref 96–106)
Creatinine, Ser: 1.39 mg/dL — ABNORMAL HIGH (ref 0.57–1.00)
GFR calc Af Amer: 46 mL/min/{1.73_m2} — ABNORMAL LOW (ref >59)
GFR calc non Af Amer: 40 mL/min/{1.73_m2} — ABNORMAL LOW (ref >59)
Glucose: 91 mg/dL (ref 65–99)
Potassium: 4.2 mmol/L (ref 3.5–5.2)
Sodium: 142 mmol/L (ref 134–144)

## 2018-09-13 LAB — HEMOGLOBIN A1C
Est. average glucose Bld gHb Est-mCnc: 117 mg/dL
Hgb A1c MFr Bld: 5.7 % — ABNORMAL HIGH (ref 4.8–5.6)

## 2018-09-13 LAB — CORTISOL: CORTISOL: 13.2 ug/dL

## 2018-09-15 ENCOUNTER — Other Ambulatory Visit: Payer: Self-pay | Admitting: Sports Medicine

## 2018-09-15 DIAGNOSIS — M25571 Pain in right ankle and joints of right foot: Secondary | ICD-10-CM

## 2018-09-15 DIAGNOSIS — M779 Enthesopathy, unspecified: Secondary | ICD-10-CM

## 2018-09-15 DIAGNOSIS — S96911A Strain of unspecified muscle and tendon at ankle and foot level, right foot, initial encounter: Secondary | ICD-10-CM

## 2018-09-15 NOTE — Telephone Encounter (Signed)
Left message with pt's dtr, Debbie to call for MRI appt.

## 2018-09-15 NOTE — Telephone Encounter (Signed)
[facility] scheduled pt for [phone] right ankle wo contrast 09/19/2018 arrive 5:45pm imaging 6:00pm.

## 2018-09-18 ENCOUNTER — Encounter: Payer: Self-pay | Admitting: Family Medicine

## 2018-09-18 ENCOUNTER — Ambulatory Visit: Payer: BLUE CROSS/BLUE SHIELD | Admitting: Family Medicine

## 2018-09-18 VITALS — BP 124/86 | Ht 60.0 in | Wt 153.6 lb

## 2018-09-18 DIAGNOSIS — N289 Disorder of kidney and ureter, unspecified: Secondary | ICD-10-CM

## 2018-09-18 DIAGNOSIS — I10 Essential (primary) hypertension: Secondary | ICD-10-CM

## 2018-09-18 DIAGNOSIS — R300 Dysuria: Secondary | ICD-10-CM

## 2018-09-18 DIAGNOSIS — E781 Pure hyperglyceridemia: Secondary | ICD-10-CM

## 2018-09-18 DIAGNOSIS — E271 Primary adrenocortical insufficiency: Secondary | ICD-10-CM

## 2018-09-18 LAB — POCT URINALYSIS DIPSTICK
Spec Grav, UA: 1.01 (ref 1.010–1.025)
pH, UA: 7 (ref 5.0–8.0)

## 2018-09-18 MED ORDER — CEFPROZIL 500 MG PO TABS: ORAL_TABLET | ORAL | 0 refills | Status: DC

## 2018-09-18 MED ORDER — FAMOTIDINE 40 MG PO TABS: ORAL_TABLET | ORAL | 1 refills | Status: DC

## 2018-09-18 MED ORDER — EZETIMIBE 10 MG PO TABS: 10.0000 mg | ORAL_TABLET | Freq: Every day | ORAL | 1 refills | Status: DC

## 2018-09-18 MED ORDER — LISINOPRIL 5 MG PO TABS: ORAL_TABLET | ORAL | 1 refills | Status: DC

## 2018-09-18 MED ORDER — LEVOTHYROXINE SODIUM 75 MCG PO TABS: ORAL_TABLET | ORAL | 5 refills | Status: DC

## 2018-09-18 NOTE — Telephone Encounter (Signed)
Unable to leave a message on mobile phone, voicemail is not set up.

## 2018-09-18 NOTE — Telephone Encounter (Signed)
Left message informing pt of [facility] appt and time 09/19/2018.

## 2018-09-18 NOTE — Progress Notes (Signed)
Subjective:    Patient ID: Stacy Jensen, female    DOB: [birth year], 62 y.o.   MRN: [id number]  Hypertension  This is a chronic problem. Pertinent negatives include no chest pain, headaches or shortness of breath. There are no compliance problems.   Urinary Tract Infection   This is a new problem. The current episode started in the past 7 days. The quality of the pain is described as burning. Associated symptoms include frequency. Pertinent negatives include no nausea. Associated symptoms comments: Cloudy urine, excessive sleepiness(pt states she usually has this symptom with UTI). Treatments tried: Pt had UTI back before Thanksgiving. completed med prescribed by [facility].   Patient also has adrenal insufficiency plus also a history of Addison's disease on permanent medication recently had lab work completed  Hyperlipidemia watch his diet fairly well unable to tolerate statins  History of renal insufficiency watch his diet tries to avoid excessive starches and proteins Pt went to foot doctor on Friday Sep 12 2018 Results for orders placed or performed in visit on 09/18/18  POCT Urinalysis Dipstick  Result Value Ref Range   Color, UA cloudy    Clarity, UA     Glucose, UA     Bilirubin, UA +    Ketones, UA     Spec Grav, UA 1.010 1.010 - 1.025   Blood, UA +    pH, UA 7.0 5.0 - 8.0   Protein, UA     Urobilinogen, UA     Nitrite, UA     Leukocytes, UA Moderate (2+) (A) Negative   Appearance     Odor +     Review of Systems  Constitutional: Negative for activity change, fatigue and fever.  HENT: Negative for congestion and rhinorrhea.   Respiratory: Negative for cough, chest tightness and shortness of breath.   Cardiovascular: Negative for chest pain and leg swelling.  Gastrointestinal: Negative for abdominal pain and nausea.  Genitourinary: Positive for frequency.  Skin: Negative for color change.  Neurological: Negative for dizziness and headaches.  Psychiatric/Behavioral:  Negative for agitation and behavioral problems.       Objective:   Physical Exam Vitals signs reviewed.  Constitutional:      General: She is not in acute distress. HENT:     Head: Normocephalic.  Cardiovascular:     Rate and Rhythm: Normal rate and regular rhythm.     Heart sounds: Normal heart sounds. No murmur.  Pulmonary:     Effort: Pulmonary effort is normal.     Breath sounds: Normal breath sounds.  Lymphadenopathy:     Cervical: No cervical adenopathy.  Neurological:     Mental Status: She is alert.  Psychiatric:        Behavior: Behavior normal.           Assessment & Plan:  UTI-antibiotics prescribed culture sent await the results  HTN- Patient was seen today as part of a visit regarding hypertension. The importance of healthy diet and regular physical activity was discussed. The importance of compliance with medications discussed.  Ideal goal is to keep blood pressure low elevated levels certainly below 335/45 when possible.  The patient was counseled that keeping blood pressure under control lessen his risk of complications.  The importance of regular follow-ups was discussed with the patient.  Low-salt diet such as DASH recommended.  Regular physical activity was recommended as well.  Patient was advised to keep regular follow-ups.  Adrenal insufficiency continue monitoring labs every 6 months continue medication  Hyperlipidemia does not tolerate statins try Zetia 10 mg daily  renal insufficiency avoid excessive proteins drink plenty of liquids  25 minutes was spent with the patient.  This statement verifies that 25 minutes was indeed spent with the patient.  More than 50% of this visit-total duration of the visit-was spent in counseling and coordination of care. The issues that the patient came in for today as reflected in the diagnosis (s) please refer to documentation for further details.  Follow-up 6 months

## 2018-09-19 DIAGNOSIS — S96911A Strain of unspecified muscle and tendon at ankle and foot level, right foot, initial encounter: Secondary | ICD-10-CM | POA: Diagnosis not present

## 2018-09-22 LAB — URINE CULTURE

## 2018-09-22 LAB — SPECIMEN STATUS REPORT

## 2018-09-25 ENCOUNTER — Encounter: Payer: Self-pay | Admitting: Sports Medicine

## 2018-09-30 ENCOUNTER — Telehealth: Payer: Self-pay | Admitting: *Deleted

## 2018-09-30 NOTE — Telephone Encounter (Signed)
Patient called the office looking for the results of her MRI.

## 2018-09-30 NOTE — Telephone Encounter (Signed)
Patient needs to discuss in office her MRI shows lots of changes and we need to come up with a plan; possible surgery

## 2018-10-01 NOTE — Telephone Encounter (Signed)
Left message informing pt of Dr. Leeanne Rio review of results and to call to make an appt to discuss results.

## 2018-10-02 NOTE — Telephone Encounter (Signed)
Pt called to set up an appt, I transferred to schedulers and routed message to schedulers.

## 2018-10-10 ENCOUNTER — Ambulatory Visit: Payer: BLUE CROSS/BLUE SHIELD | Admitting: Sports Medicine

## 2018-10-10 ENCOUNTER — Encounter: Payer: Self-pay | Admitting: Sports Medicine

## 2018-10-10 DIAGNOSIS — S92144A Nondisplaced dome fracture of right talus, initial encounter for closed fracture: Secondary | ICD-10-CM

## 2018-10-10 DIAGNOSIS — M8580 Other specified disorders of bone density and structure, unspecified site: Secondary | ICD-10-CM

## 2018-10-10 DIAGNOSIS — E240 Pituitary-dependent Cushing's disease: Secondary | ICD-10-CM

## 2018-10-10 DIAGNOSIS — S93401A Sprain of unspecified ligament of right ankle, initial encounter: Secondary | ICD-10-CM | POA: Diagnosis not present

## 2018-10-10 DIAGNOSIS — S96911A Strain of unspecified muscle and tendon at ankle and foot level, right foot, initial encounter: Secondary | ICD-10-CM

## 2018-10-10 DIAGNOSIS — M779 Enthesopathy, unspecified: Secondary | ICD-10-CM | POA: Diagnosis not present

## 2018-10-10 DIAGNOSIS — M25571 Pain in right ankle and joints of right foot: Secondary | ICD-10-CM

## 2018-10-10 DIAGNOSIS — M19079 Primary osteoarthritis, unspecified ankle and foot: Secondary | ICD-10-CM

## 2018-10-10 DIAGNOSIS — E271 Primary adrenocortical insufficiency: Secondary | ICD-10-CM

## 2018-10-10 NOTE — Progress Notes (Signed)
Subjective:  Stacy Jensen is a 63 y.o. female patient who presents to office for follow-up evaluation of right ankle pain.  Patient reports that she applied additional cushioning to her boot and it seems to help her foot doing a lot better and her pain is a lot better now 2-3 out of 10.  Patient reports that she completed her steroid Dosepak and is also here for discussion of her MRI results.  Patient denies increased redness warmth swelling or any other signs or symptoms at this time.   Patient Active Problem List   Diagnosis Date Noted  . Osteopenia 11/20/2015  . Adrenal insufficiency (Addison's disease) ([place]) 11/13/2015  . Pituitary Cushing's syndrome ([place]) 11/13/2015  . Prediabetes 10/19/2015  . Renal insufficiency 11/22/2014  . Obesity 11/22/2014  . Benign paroxysmal positional vertigo 06/14/2014  . Postmenopausal bleeding 11/12/2013  . Hypothyroid 09/04/2013  . Overactive bladder 09/04/2013  . Hypertriglyceridemia 02/05/2013  . Hyperlipidemia 02/05/2013  . Essential hypertension, benign 02/05/2013  . Cushing syndrome ([place]) 02/05/2013    Current Outpatient Medications on File Prior to Visit  Medication Sig Dispense Refill  . Calcium Citrate (CITRACAL PO) Take by mouth daily.    . cefPROZIL (CEFZIL) 500 MG tablet Take one tablet by mouth twice daily for 7 days 14 tablet 0  . Chlorpheniramine Maleate (ALLERGY RELIEF PO) Take by mouth.    . co-enzyme Q-10 30 MG capsule Take 30 mg by mouth 3 (three) times daily.    Marland Kitchen ezetimibe (ZETIA) 10 MG tablet Take 1 tablet (10 mg total) by mouth daily. 90 tablet 1  . famotidine (PEPCID) 40 MG tablet Take one tablet by mouth once a day 90 tablet 1  . fenofibrate 160 MG tablet TAKE 1 TABLET BY MOUTH ONCE DAILY 30 tablet 4  . fluticasone (FLONASE) 50 MCG/ACT nasal spray USE 1 SPRAY(S) IN EACH NOSTRIL ONCE DAILY 16 g 5  . hydrocortisone (CORTEF) 10 MG tablet  TAKE 2 TABLETS BY MOUTH IN THE MORNING AND 1 TABLET  IN THE EVENING 90 tablet 1  .  levothyroxine (SYNTHROID, LEVOTHROID) 75 MCG tablet TAKE 1 TABLET BY MOUTH ONCE DAILY TAKE  6  DAYS  A  WEEK 30 tablet 5  . lisinopril (PRINIVIL,ZESTRIL) 5 MG tablet TAKE 1 TABLET BY MOUTH DAILY 90 tablet 1  . methylPREDNISolone (MEDROL DOSEPAK) 4 MG TBPK tablet Take as directed 21 tablet 0  . Multiple Vitamins-Minerals (MULTIVITAMIN WITH MINERALS) tablet Take 1 tablet by mouth daily.    . Omega-3 Fatty Acids (OMEGA-3 FISH OIL) 1200 MG CAPS Take by mouth daily.     Marland Kitchen oxybutynin (DITROPAN) 5 MG tablet TAKE 1 TABLET BY MOUTH THREE TIMES DAILY 90 tablet 3   No current facility-administered medications on file prior to visit.     Allergies  Allergen Reactions  . Codeine Nausea And Vomiting  . Statins     Body aches    Objective:  General: Alert and oriented x3 in no acute distress  Dermatology: No open lesions bilateral lower extremities, no webspace macerations, no ecchymosis bilateral, all nails x 10 are well manicured.  Vascular: Dorsalis Pedis and Posterior Tibial pedal pulses palpable, Capillary Fill Time 3 seconds,(+) pedal hair growth bilateral, no edema bilateral lower extremities, Temperature gradient within normal limits.  Neurology: Gross sensation intact via light touch bilateral.  Musculoskeletal: Mild tenderness with palpation at lateral and medial ankle on right diffusely, negative talar tilt, Negative tib-fib stress, No instability. No pain with calf compression bilateral. Range of motion within  normal limits with mild guarding on right ankle versus resistance. Strength within normal limits in all groups bilateral.   MRI reveals talar dome fracture with depression extending into the subtalar joint and into the tibiotalar joint with collapse and diffuse arthritic changes.  Anterior talofibular ligament tear.  Assessment and Plan: Problem List Items Addressed This Visit      Endocrine   Adrenal insufficiency (Addison's disease) ([place])   Pituitary Cushing's syndrome ([place])      Musculoskeletal and Integument   Osteopenia    Other Visit Diagnoses    Closed nondisplaced fracture of dome of right talus, initial encounter    -  Primary   Acute right ankle pain       Tendonitis       Sprain of right ankle, unspecified ligament, initial encounter       Tear of tendon of right ankle, initial encounter       Ankle arthritis           -Complete examination performed -MRI results reviewed -Discussed treatement options right ankle pain with chronic arthritis and chronic versus acute fracture -Patient reports with her history of Cushing's it is not uncommon that she has fractures -Patient elects at this time to continue with cam boot for an additional 3 weeks at next visit if patient is doing better we will transition her from the boot to a brace and recommend a custom insole for her versus considering physical therapy -I did advise patient that if her pain continues due to the bone integrity and collapse she may be better suited for a consult to orthopedics to consider a more aggressive ankle fusion procedure of which I do not do -Patient to return to office in 3 weeks or sooner if condition worsens.  Landis Martins, DPM

## 2018-11-04 ENCOUNTER — Other Ambulatory Visit: Payer: Self-pay | Admitting: Family Medicine

## 2018-12-01 DIAGNOSIS — Z1231 Encounter for screening mammogram for malignant neoplasm of breast: Secondary | ICD-10-CM | POA: Diagnosis not present

## 2018-12-12 ENCOUNTER — Other Ambulatory Visit: Payer: Self-pay | Admitting: Family Medicine

## 2018-12-18 ENCOUNTER — Other Ambulatory Visit: Payer: Self-pay | Admitting: Family Medicine

## 2019-01-19 ENCOUNTER — Other Ambulatory Visit: Payer: Self-pay | Admitting: Family Medicine

## 2019-01-19 NOTE — Telephone Encounter (Signed)
May have this +1 refill virtual visit in May

## 2019-02-02 ENCOUNTER — Other Ambulatory Visit: Payer: Self-pay | Admitting: Family Medicine

## 2019-02-02 NOTE — Telephone Encounter (Signed)
Schedule virtual visit May have refill of each

## 2019-02-03 ENCOUNTER — Telehealth: Payer: Self-pay | Admitting: Family Medicine

## 2019-02-03 DIAGNOSIS — Z79899 Other long term (current) drug therapy: Secondary | ICD-10-CM

## 2019-02-03 DIAGNOSIS — R7303 Prediabetes: Secondary | ICD-10-CM

## 2019-02-03 DIAGNOSIS — E7849 Other hyperlipidemia: Secondary | ICD-10-CM

## 2019-02-03 DIAGNOSIS — E039 Hypothyroidism, unspecified: Secondary | ICD-10-CM

## 2019-02-03 DIAGNOSIS — E271 Primary adrenocortical insufficiency: Secondary | ICD-10-CM

## 2019-02-03 NOTE — Telephone Encounter (Signed)
Last labs 09/12/18 lipid, liver, bmp, a1c, cortisol

## 2019-02-03 NOTE — Telephone Encounter (Signed)
Lipid, liver, metabolic 7, TSH, cortisol level, A1c  Diabetes Addison's disease hypertension hyperlipidemia and hypothyroidism

## 2019-02-03 NOTE — Telephone Encounter (Signed)
Orders put in. Pt notified.  

## 2019-02-03 NOTE — Telephone Encounter (Signed)
Pt has virtual visit 02/18/2019, needs lab orders  Please call pt when done

## 2019-02-03 NOTE — Telephone Encounter (Signed)
Left message to return call 

## 2019-02-13 DIAGNOSIS — E039 Hypothyroidism, unspecified: Secondary | ICD-10-CM | POA: Diagnosis not present

## 2019-02-13 DIAGNOSIS — Z79899 Other long term (current) drug therapy: Secondary | ICD-10-CM | POA: Diagnosis not present

## 2019-02-13 DIAGNOSIS — E7849 Other hyperlipidemia: Secondary | ICD-10-CM | POA: Diagnosis not present

## 2019-02-13 DIAGNOSIS — R7303 Prediabetes: Secondary | ICD-10-CM | POA: Diagnosis not present

## 2019-02-14 LAB — LIPID PANEL
Chol/HDL Ratio: 3.9 ratio (ref 0.0–4.4)
Cholesterol, Total: 220 mg/dL — ABNORMAL HIGH (ref 100–199)
HDL: 56 mg/dL (ref >39)
LDL Calculated: 132 mg/dL — ABNORMAL HIGH (ref 0–99)
Triglycerides: 159 mg/dL — ABNORMAL HIGH (ref 0–149)
VLDL Cholesterol Cal: 32 mg/dL (ref 5–40)

## 2019-02-14 LAB — HEPATIC FUNCTION PANEL
ALT: 22 IU/L (ref 0–32)
AST: 29 IU/L (ref 0–40)
Albumin: 4.5 g/dL (ref 3.8–4.8)
Alkaline Phosphatase: 46 IU/L (ref 39–117)
Bilirubin Total: 0.3 mg/dL (ref 0.0–1.2)
Bilirubin, Direct: 0.11 mg/dL (ref 0.00–0.40)
Total Protein: 7.2 g/dL (ref 6.0–8.5)

## 2019-02-14 LAB — BASIC METABOLIC PANEL
BUN/Creatinine Ratio: 10 — ABNORMAL LOW (ref 12–28)
BUN: 12 mg/dL (ref 8–27)
CO2: 22 mmol/L (ref 20–29)
Calcium: 9.9 mg/dL (ref 8.7–10.3)
Chloride: 106 mmol/L (ref 96–106)
Creatinine, Ser: 1.17 mg/dL — ABNORMAL HIGH (ref 0.57–1.00)
GFR calc Af Amer: 57 mL/min/{1.73_m2} — ABNORMAL LOW (ref >59)
GFR calc non Af Amer: 49 mL/min/{1.73_m2} — ABNORMAL LOW (ref >59)
Glucose: 90 mg/dL (ref 65–99)
Potassium: 4.2 mmol/L (ref 3.5–5.2)
Sodium: 146 mmol/L — ABNORMAL HIGH (ref 134–144)

## 2019-02-14 LAB — CORTISOL: Cortisol: 11 ug/dL

## 2019-02-14 LAB — HEMOGLOBIN A1C
Est. average glucose Bld gHb Est-mCnc: 117 mg/dL
Hgb A1c MFr Bld: 5.7 % — ABNORMAL HIGH (ref 4.8–5.6)

## 2019-02-14 LAB — TSH: TSH: 2.97 u[IU]/mL (ref 0.450–4.500)

## 2019-02-18 ENCOUNTER — Other Ambulatory Visit: Payer: Self-pay

## 2019-02-18 ENCOUNTER — Ambulatory Visit (INDEPENDENT_AMBULATORY_CARE_PROVIDER_SITE_OTHER): Payer: BLUE CROSS/BLUE SHIELD | Admitting: Family Medicine

## 2019-02-18 DIAGNOSIS — E271 Primary adrenocortical insufficiency: Secondary | ICD-10-CM

## 2019-02-18 DIAGNOSIS — R7303 Prediabetes: Secondary | ICD-10-CM

## 2019-02-18 DIAGNOSIS — M79673 Pain in unspecified foot: Secondary | ICD-10-CM

## 2019-02-18 DIAGNOSIS — N3281 Overactive bladder: Secondary | ICD-10-CM

## 2019-02-18 DIAGNOSIS — I10 Essential (primary) hypertension: Secondary | ICD-10-CM

## 2019-02-18 DIAGNOSIS — E039 Hypothyroidism, unspecified: Secondary | ICD-10-CM

## 2019-02-18 DIAGNOSIS — N39498 Other specified urinary incontinence: Secondary | ICD-10-CM

## 2019-02-18 MED ORDER — EZETIMIBE 10 MG PO TABS: 10.0000 mg | ORAL_TABLET | Freq: Every day | ORAL | 1 refills | Status: DC

## 2019-02-18 MED ORDER — FENOFIBRATE 160 MG PO TABS: 160.0000 mg | ORAL_TABLET | Freq: Every day | ORAL | 1 refills | Status: DC

## 2019-02-18 MED ORDER — MIRABEGRON ER 25 MG PO TB24: 25.0000 mg | ORAL_TABLET | Freq: Every day | ORAL | 3 refills | Status: DC

## 2019-02-18 MED ORDER — FAMOTIDINE 40 MG PO TABS: ORAL_TABLET | ORAL | 1 refills | Status: DC

## 2019-02-18 MED ORDER — LISINOPRIL 5 MG PO TABS: ORAL_TABLET | ORAL | 1 refills | Status: DC

## 2019-02-18 MED ORDER — LEVOTHYROXINE SODIUM 75 MCG PO TABS: ORAL_TABLET | ORAL | 5 refills | Status: DC

## 2019-02-18 MED ORDER — HYDROCORTISONE 10 MG PO TABS: 20.0000 mg | ORAL_TABLET | Freq: Every day | ORAL | 1 refills | Status: DC

## 2019-02-18 NOTE — Progress Notes (Signed)
Subjective:    Patient ID: Stacy Jensen, female    DOB: 08/22/1956, 63 y.o.   MRN: [id number]  Hypertension  This is a chronic problem. Pertinent negatives include no chest pain or shortness of breath. (Pt states that she is doing well. ) There are no compliance problems.   Hyperlipidemia  This is a chronic problem. Pertinent negatives include no chest pain or shortness of breath. There are no compliance problems.  Risk factors for coronary artery disease include hypertension.   Pt states that she is still having problems with right foot and she would like to be referred to orthopedic. Pt states PT does not help her.  She talked with me regarding how she can feel it is healed properly she would like to see orthopedic specialist we did discuss this in detail  Pt states she is taking Oxybutynin to help with incontience but she is still having trouble with bladder. Pt wonders if she should go back to Dr.Eure.  She relates that she is having incontinence issues and medicine does not seem to be working as good as it should she would like to try a different approach  Virtual Visit via Video Note  I connected with Stacy Jensen on 02/18/19 at  9:30 AM EDT by a video enabled telemedicine application and verified that I am speaking with the correct person using two identifiers.  Location: Patient: home Provider: office   I discussed the limitations of evaluation and management by telemedicine and the availability of in person appointments. The patient expressed understanding and agreed to proceed.  History of Present Illness:    Observations/Objective:   Assessment and Plan:   Follow Up Instructions:    I discussed the assessment and treatment plan with the patient. The patient was provided an opportunity to ask questions and all were answered. The patient agreed with the plan and demonstrated an understanding of the instructions.   The patient was advised to call back or seek an  in-person evaluation if the symptoms worsen or if the condition fails to improve as anticipated.  I provided 15 minutes of non-face-to-face time during this encounter.   Vicente Males, LPN    Review of Systems  Constitutional: Negative for activity change, appetite change and fatigue.  HENT: Negative for congestion and rhinorrhea.   Respiratory: Negative for cough and shortness of breath.   Cardiovascular: Negative for chest pain and leg swelling.  Gastrointestinal: Negative for abdominal pain and diarrhea.  Endocrine: Negative for polydipsia and polyphagia.  Skin: Negative for color change.  Neurological: Negative for dizziness and weakness.  Psychiatric/Behavioral: Negative for behavioral problems and confusion.       Objective:   Physical Exam  Today's visit was via telephone Physical exam was not possible for this visit       Assessment & Plan:  Patient has adrenal insufficiency she does take her hydrocortisone on a regular basis lab work looks good continue current measures  Prediabetes watches her diet closely tries to stay physically active A1c decent control  Blood pressure issues overall doing well she states her home monitoring is doing well continue current measures  Monitor diet closely watch portions eat healthy stay active  Pain in foot with a history of fracture would like to see orthopedics with Dr. [facility]  Overactive bladder we will try Mybetriq If this does not work adequately for her then referral to urology  Hypothyroidism continue current measures  25 minutes was spent with the patient.  This statement verifies that 25 minutes was indeed spent with the patient.  More than 50% of this visit-total duration of the visit-was spent in counseling and coordination of care. The issues that the patient came in for today as reflected in the diagnosis (s) please refer to documentation for further details.  The patient was seen today for GERD. Patient  benefits from medication. Patient to continue medication. Keep all regular follow ups.

## 2019-02-23 ENCOUNTER — Telehealth: Payer: Self-pay | Admitting: Family Medicine

## 2019-02-23 DIAGNOSIS — Z1211 Encounter for screening for malignant neoplasm of colon: Secondary | ICD-10-CM

## 2019-02-23 NOTE — Telephone Encounter (Signed)
Wants colonoscopy Ref Dr Laural Golden

## 2019-02-23 NOTE — Telephone Encounter (Signed)
Please put in referral for colonoscopy thank you

## 2019-02-24 NOTE — Addendum Note (Signed)
Addended by: Dairl Ponder on: 02/24/2019 09:05 AM   Modules accepted: Orders

## 2019-02-24 NOTE — Telephone Encounter (Signed)
Referral ordered in EPIC. 

## 2019-02-25 ENCOUNTER — Encounter (INDEPENDENT_AMBULATORY_CARE_PROVIDER_SITE_OTHER): Payer: Self-pay | Admitting: *Deleted

## 2019-03-10 ENCOUNTER — Encounter: Payer: Self-pay | Admitting: Family Medicine

## 2019-03-20 DIAGNOSIS — S92001A Unspecified fracture of right calcaneus, initial encounter for closed fracture: Secondary | ICD-10-CM | POA: Diagnosis not present

## 2019-05-17 ENCOUNTER — Encounter: Payer: Self-pay | Admitting: Nurse Practitioner

## 2019-06-27 ENCOUNTER — Other Ambulatory Visit: Payer: Self-pay | Admitting: Family Medicine

## 2019-07-07 ENCOUNTER — Telehealth: Payer: Self-pay | Admitting: Family Medicine

## 2019-07-07 ENCOUNTER — Other Ambulatory Visit: Payer: Self-pay

## 2019-07-07 MED ORDER — OXYBUTYNIN CHLORIDE 5 MG PO TABS: 5.0000 mg | ORAL_TABLET | Freq: Two times a day (BID) | ORAL | 5 refills | Status: DC

## 2019-07-07 NOTE — Telephone Encounter (Signed)
Tried to contact patient. No voicemail set up 

## 2019-07-07 NOTE — Telephone Encounter (Signed)
Nurses-it would be fine to discontinue MyBetriq She can have a prescription for oxybutynin 5 mg 1 twice daily, #60,, 5 refills

## 2019-07-07 NOTE — Telephone Encounter (Signed)
Patient said her Oxybutynin (Ditropan) was denied per the pharmacy.  Michela Pitcher she has been on it for years and is not due a visit until the end of November. (Pt sch for 08-17-19)  She has about 1 week left of meds.   [place]

## 2019-07-07 NOTE — Telephone Encounter (Signed)
Please clarify with the patient Her medication list shows MyBetriq as her bladder medicine If it needs prior approval let us look into this and try to figure it out  Often insurance companies can on a yearly basis changed their formulary what they will cover and what they will not and until we find out what her insurance company says essentially we do not have any insight why her pharmacy says its denied [facility]

## 2019-07-07 NOTE — Telephone Encounter (Signed)
Medication sent to pharmacy. Pt will check with pharmacy in a couple of days, no need to call back

## 2019-07-07 NOTE — Telephone Encounter (Signed)
Pt last seen 02/18/2019. Please advise. Thank you

## 2019-07-07 NOTE — Telephone Encounter (Signed)
Pt returned call and states that she was taking MyBetriq but was not having any improvement and MyBetriq made her angry. Pt states she had refills on Oxybutynin. Pt states she spoke with pharmacist and was instructed by pharmacist to stop the MyBetriq for about a week and then take Oxybutynin. Pt states she is doing well on Oxybutynin. She is taking 5 mg BID. Please advise. Thank you (No need to call patient back, she will check with [facility] in a few days)

## 2019-07-22 ENCOUNTER — Telehealth: Payer: Self-pay | Admitting: Family Medicine

## 2019-07-22 NOTE — Telephone Encounter (Signed)
So many businesses shut down out of an abundance of caution given these type of circumstances  The likelihood of her picking up Covid from this encounter is very low I would not recommend testing unless the patient just would feel better doing testing  Moving forward I am personally encouraging all of our patients to avoid eating indoor restaurants because of increased risk of being exposed to Covid from other patrons  It is wiser to get to go food and eat at a park or in the car or at home away from other people  As is with anything if exhibiting symptoms of Covid such as sore throat runny nose coughing fever chills body aches diarrhea issues testing and evaluation recommend

## 2019-07-22 NOTE — Telephone Encounter (Signed)
Patient notified via Dr Nicki Reaper:    So many businesses shut down out of an abundance of caution given these type of circumstances  The likelihood of her picking up Covid from this encounter is very low I would not recommend testing unless the patient just would feel better doing testing  Moving forward I am personally encouraging all of our patients to avoid eating indoor restaurants because of increased risk of being exposed to Covid from other patrons  It is wiser to get to go food and eat at a park or in the car or at home away from other people  As is with anything if exhibiting symptoms of Covid such as sore throat runny nose coughing fever chills body aches diarrhea issues testing and evaluation recommend     Patient verbalized understanding.

## 2019-07-22 NOTE — Telephone Encounter (Signed)
Patient ate at [facility] on Monday pm -dine in- while they were eating they closed the restaurant down because another customer dined in Friday and had covid. They have to be closed down for one week. Patient spoke with owner concerning this.  She wonders if there is anything they need to do

## 2019-07-22 NOTE — Telephone Encounter (Signed)
Pt ate out Monday & while she was there it was shut down due to another customer ate there Friday that tested positive for Covid  Pt wonders what she should do?  Wait to be tested, how long  Please advise

## 2019-08-10 ENCOUNTER — Telehealth: Payer: Self-pay | Admitting: Family Medicine

## 2019-08-10 DIAGNOSIS — E7849 Other hyperlipidemia: Secondary | ICD-10-CM

## 2019-08-10 DIAGNOSIS — E271 Primary adrenocortical insufficiency: Secondary | ICD-10-CM

## 2019-08-10 DIAGNOSIS — N289 Disorder of kidney and ureter, unspecified: Secondary | ICD-10-CM

## 2019-08-10 DIAGNOSIS — Z1159 Encounter for screening for other viral diseases: Secondary | ICD-10-CM

## 2019-08-10 NOTE — Telephone Encounter (Signed)
Pt would like to have lab work done before appt on 11/23. She would also like to be tested for Hep C and to have her cortisol levels checked

## 2019-08-11 NOTE — Telephone Encounter (Signed)
Lab orders placed. Tried to contact patient, no voicemail set up

## 2019-08-11 NOTE — Telephone Encounter (Signed)
Hep C antibody, cortisol, metabolic 7, lipid Adrenal insufficiency, renal insufficiency, hyperlipidemia, hep C screening

## 2019-08-13 DIAGNOSIS — Z1159 Encounter for screening for other viral diseases: Secondary | ICD-10-CM | POA: Diagnosis not present

## 2019-08-13 DIAGNOSIS — E271 Primary adrenocortical insufficiency: Secondary | ICD-10-CM | POA: Diagnosis not present

## 2019-08-13 DIAGNOSIS — N289 Disorder of kidney and ureter, unspecified: Secondary | ICD-10-CM | POA: Diagnosis not present

## 2019-08-13 DIAGNOSIS — E7849 Other hyperlipidemia: Secondary | ICD-10-CM | POA: Diagnosis not present

## 2019-08-14 LAB — LIPID PANEL
Chol/HDL Ratio: 4.8 ratio — ABNORMAL HIGH (ref 0.0–4.4)
Cholesterol, Total: 228 mg/dL — ABNORMAL HIGH (ref 100–199)
HDL: 48 mg/dL (ref >39)
LDL Chol Calc (NIH): 147 mg/dL — ABNORMAL HIGH (ref 0–99)
Triglycerides: 184 mg/dL — ABNORMAL HIGH (ref 0–149)
VLDL Cholesterol Cal: 33 mg/dL (ref 5–40)

## 2019-08-14 LAB — BASIC METABOLIC PANEL
BUN/Creatinine Ratio: 10 — ABNORMAL LOW (ref 12–28)
BUN: 12 mg/dL (ref 8–27)
CO2: 24 mmol/L (ref 20–29)
Calcium: 9.8 mg/dL (ref 8.7–10.3)
Chloride: 103 mmol/L (ref 96–106)
Creatinine, Ser: 1.26 mg/dL — ABNORMAL HIGH (ref 0.57–1.00)
GFR calc Af Amer: 52 mL/min/{1.73_m2} — ABNORMAL LOW (ref >59)
GFR calc non Af Amer: 45 mL/min/{1.73_m2} — ABNORMAL LOW (ref >59)
Glucose: 93 mg/dL (ref 65–99)
Potassium: 4.2 mmol/L (ref 3.5–5.2)
Sodium: 146 mmol/L — ABNORMAL HIGH (ref 134–144)

## 2019-08-14 LAB — CORTISOL: Cortisol: 16.4 ug/dL

## 2019-08-14 LAB — HEPATITIS C ANTIBODY: Hep C Virus Ab: <0.1 s/co ratio (ref 0.0–0.9)

## 2019-08-17 ENCOUNTER — Encounter: Payer: Self-pay | Admitting: Family Medicine

## 2019-08-17 ENCOUNTER — Other Ambulatory Visit: Payer: Self-pay

## 2019-08-17 ENCOUNTER — Ambulatory Visit: Payer: BC Managed Care – PPO | Admitting: Family Medicine

## 2019-08-17 DIAGNOSIS — E7849 Other hyperlipidemia: Secondary | ICD-10-CM | POA: Diagnosis not present

## 2019-08-17 DIAGNOSIS — I10 Essential (primary) hypertension: Secondary | ICD-10-CM

## 2019-08-17 DIAGNOSIS — N289 Disorder of kidney and ureter, unspecified: Secondary | ICD-10-CM | POA: Diagnosis not present

## 2019-08-17 DIAGNOSIS — E240 Pituitary-dependent Cushing's disease: Secondary | ICD-10-CM

## 2019-08-17 MED ORDER — EZETIMIBE 10 MG PO TABS: 10.0000 mg | ORAL_TABLET | Freq: Every day | ORAL | 1 refills | Status: DC

## 2019-08-17 MED ORDER — FENOFIBRATE 160 MG PO TABS: 160.0000 mg | ORAL_TABLET | Freq: Every day | ORAL | 1 refills | Status: DC

## 2019-08-17 MED ORDER — LISINOPRIL 5 MG PO TABS: ORAL_TABLET | ORAL | 1 refills | Status: DC

## 2019-08-17 MED ORDER — HYDROCORTISONE 10 MG PO TABS: 20.0000 mg | ORAL_TABLET | Freq: Every day | ORAL | 1 refills | Status: DC

## 2019-08-17 MED ORDER — LEVOTHYROXINE SODIUM 75 MCG PO TABS: ORAL_TABLET | ORAL | 5 refills | Status: DC

## 2019-08-17 MED ORDER — FAMOTIDINE 40 MG PO TABS: ORAL_TABLET | ORAL | 1 refills | Status: DC

## 2019-08-17 NOTE — Telephone Encounter (Signed)
Labs were completed 08/13/2019

## 2019-08-17 NOTE — Progress Notes (Signed)
Subjective:    Patient ID: Stacy Jensen, female    DOB: 1956/08/21, 63 y.o.   MRN: [id number]  Hypertension This is a chronic problem. The current episode started more than 1 year ago. Risk factors for coronary artery disease include dyslipidemia and post-menopausal state. Treatments tried: Lisinopril.  Patient presents for follow-up had recent lab work.  Overall she is doing fairly well.  She is trying to maintain safe habits because of Covid  She does have a history of pituitary issues and Cushing's syndrome she does take her hydrocortisone on a regular basis and has her labs on a regular basis denies any problems  Renal insufficiency she tries to minimize proteins in her diet tries to eat healthy on a regular basis denies any major setbacks  She does take her cholesterol medicine on a regular basis and tries to minimize fats in her diet  She also takes her thyroid medicine recent lab work earlier this year showed good thyroid function  Patient does have prediabetes she tries her best to minimize starches in her diet no recent A1c earlier this year looked good  Reflux related issues under good control with her medication.  Review of Systems Virtual Visit via Video Note  I connected with Stacy Jensen on 08/17/19 at 10:00 AM EST by a video enabled telemedicine application and verified that I am speaking with the correct person using two identifiers.  Location: Patient: home Provider: office   I discussed the limitations of evaluation and management by telemedicine and the availability of in person appointments. The patient expressed understanding and agreed to proceed.  History of Present Illness:    Observations/Objective:   Assessment and Plan:   Follow Up Instructions:    I discussed the assessment and treatment plan with the patient. The patient was provided an opportunity to ask questions and all were answered. The patient agreed with the plan and demonstrated an  understanding of the instructions.   The patient was advised to call back or seek an in-person evaluation if the symptoms worsen or if the condition fails to improve as anticipated.  I provided 25 minutes of non-face-to-face time during this encounter.  This includes review of labs as well as ordering of medications and the visit        Objective:   Physical Exam  Today's visit was via telephone Physical exam was not possible for this visit Results for orders placed or performed in visit on 08/10/19  Hepatitis C Antibody  Result Value Ref Range   Hep C Virus Ab <0.1 0.0 - 0.9 s/co ratio  Cortisol  Result Value Ref Range   Cortisol 16.4 ug/dL  Basic Metabolic Panel (BMET)  Result Value Ref Range   Glucose 93 65 - 99 mg/dL   BUN 12 8 - 27 mg/dL   Creatinine, Ser 1.26 (H) 0.57 - 1.00 mg/dL   GFR calc non Af Amer 45 (L) >59 mL/min/1.73   GFR calc Af Amer 52 (L) >59 mL/min/1.73   BUN/Creatinine Ratio 10 (L) 12 - 28   Sodium 146 (H) 134 - 144 mmol/L   Potassium 4.2 3.5 - 5.2 mmol/L   Chloride 103 96 - 106 mmol/L   CO2 24 20 - 29 mmol/L   Calcium 9.8 8.7 - 10.3 mg/dL  Lipid Profile  Result Value Ref Range   Cholesterol, Total 228 (H) 100 - 199 mg/dL   Triglycerides 184 (H) 0 - 149 mg/dL   HDL 48 >39 mg/dL   VLDL Cholesterol  Cal 33 5 - 40 mg/dL   LDL Chol Calc (NIH) 147 (H) 0 - 99 mg/dL   Chol/HDL Ratio 4.8 (H) 0.0 - 4.4 ratio   No flowsheet data found.       Assessment & Plan:  1. Essential hypertension, benign Takes her medicine watches diet tries to keep blood pressure under good control electrolytes look good  2. Pituitary Cushing's syndrome (HCC) Cortisol level overall doing well takes her hydrocortisone on a regular basis.  3. Renal insufficiency Avoids excessive salt eats healthy stays active creatinine stable  4. Other hyperlipidemia Does take her medication does not tolerate statins Cholesterol LDL moderately elevated

## 2020-01-26 ENCOUNTER — Other Ambulatory Visit: Payer: Self-pay | Admitting: Family Medicine

## 2020-02-25 ENCOUNTER — Other Ambulatory Visit: Payer: Self-pay | Admitting: Family Medicine

## 2020-02-26 NOTE — Telephone Encounter (Signed)
Scheduled 6/29

## 2020-03-14 ENCOUNTER — Telehealth: Payer: Self-pay | Admitting: Family Medicine

## 2020-03-14 ENCOUNTER — Other Ambulatory Visit: Payer: Self-pay | Admitting: *Deleted

## 2020-03-14 DIAGNOSIS — Z79899 Other long term (current) drug therapy: Secondary | ICD-10-CM

## 2020-03-14 DIAGNOSIS — E7849 Other hyperlipidemia: Secondary | ICD-10-CM

## 2020-03-14 DIAGNOSIS — R7303 Prediabetes: Secondary | ICD-10-CM

## 2020-03-14 DIAGNOSIS — I10 Essential (primary) hypertension: Secondary | ICD-10-CM

## 2020-03-14 DIAGNOSIS — E271 Primary adrenocortical insufficiency: Secondary | ICD-10-CM

## 2020-03-14 DIAGNOSIS — E039 Hypothyroidism, unspecified: Secondary | ICD-10-CM

## 2020-03-14 NOTE — Telephone Encounter (Signed)
Lmtc - labs ordered.

## 2020-03-14 NOTE — Telephone Encounter (Signed)
Lipid, liver, metabolic 7, Q3F, TSH, cortisol  Diagnosis adrenal insufficiency, hypothyroidism, prediabetes, hypertension, hyperlipidemia

## 2020-03-14 NOTE — Telephone Encounter (Signed)
Patient has an appt on the 29th for a follow up and wants to know if she needs any labwork prior?  Ok to leave a detailed message on answering machine.

## 2020-03-15 NOTE — Telephone Encounter (Signed)
Lm on machine about labs.

## 2020-03-17 DIAGNOSIS — E7849 Other hyperlipidemia: Secondary | ICD-10-CM | POA: Diagnosis not present

## 2020-03-17 DIAGNOSIS — R7303 Prediabetes: Secondary | ICD-10-CM | POA: Diagnosis not present

## 2020-03-17 DIAGNOSIS — E271 Primary adrenocortical insufficiency: Secondary | ICD-10-CM | POA: Diagnosis not present

## 2020-03-17 DIAGNOSIS — I10 Essential (primary) hypertension: Secondary | ICD-10-CM | POA: Diagnosis not present

## 2020-03-17 DIAGNOSIS — Z79899 Other long term (current) drug therapy: Secondary | ICD-10-CM | POA: Diagnosis not present

## 2020-03-17 DIAGNOSIS — E039 Hypothyroidism, unspecified: Secondary | ICD-10-CM | POA: Diagnosis not present

## 2020-03-18 LAB — BASIC METABOLIC PANEL
BUN/Creatinine Ratio: 6 — ABNORMAL LOW (ref 12–28)
BUN: 8 mg/dL (ref 8–27)
CO2: 24 mmol/L (ref 20–29)
Calcium: 9.5 mg/dL (ref 8.7–10.3)
Chloride: 105 mmol/L (ref 96–106)
Creatinine, Ser: 1.27 mg/dL — ABNORMAL HIGH (ref 0.57–1.00)
GFR calc Af Amer: 51 mL/min/{1.73_m2} — ABNORMAL LOW (ref >59)
GFR calc non Af Amer: 44 mL/min/{1.73_m2} — ABNORMAL LOW (ref >59)
Glucose: 91 mg/dL (ref 65–99)
Potassium: 4.1 mmol/L (ref 3.5–5.2)
Sodium: 144 mmol/L (ref 134–144)

## 2020-03-18 LAB — HEPATIC FUNCTION PANEL
ALT: 18 IU/L (ref 0–32)
AST: 28 IU/L (ref 0–40)
Albumin: 4.6 g/dL (ref 3.8–4.8)
Alkaline Phosphatase: 48 IU/L (ref 48–121)
Bilirubin Total: 0.5 mg/dL (ref 0.0–1.2)
Bilirubin, Direct: 0.14 mg/dL (ref 0.00–0.40)
Total Protein: 7.3 g/dL (ref 6.0–8.5)

## 2020-03-18 LAB — TSH: TSH: 4.28 u[IU]/mL (ref 0.450–4.500)

## 2020-03-18 LAB — HEMOGLOBIN A1C
Est. average glucose Bld gHb Est-mCnc: 114 mg/dL
Hgb A1c MFr Bld: 5.6 % (ref 4.8–5.6)

## 2020-03-18 LAB — LIPID PANEL
Chol/HDL Ratio: 4.7 ratio — ABNORMAL HIGH (ref 0.0–4.4)
Cholesterol, Total: 244 mg/dL — ABNORMAL HIGH (ref 100–199)
HDL: 52 mg/dL (ref >39)
LDL Chol Calc (NIH): 158 mg/dL — ABNORMAL HIGH (ref 0–99)
Triglycerides: 188 mg/dL — ABNORMAL HIGH (ref 0–149)
VLDL Cholesterol Cal: 34 mg/dL (ref 5–40)

## 2020-03-18 LAB — CORTISOL: Cortisol: 10.2 ug/dL

## 2020-03-22 ENCOUNTER — Ambulatory Visit: Payer: BC Managed Care – PPO | Admitting: Family Medicine

## 2020-03-22 ENCOUNTER — Other Ambulatory Visit: Payer: Self-pay

## 2020-03-22 ENCOUNTER — Encounter: Payer: Self-pay | Admitting: Family Medicine

## 2020-03-22 VITALS — BP 134/84 | Temp 97.6°F | Ht 60.0 in | Wt 164.0 lb

## 2020-03-22 DIAGNOSIS — R7303 Prediabetes: Secondary | ICD-10-CM

## 2020-03-22 DIAGNOSIS — N3281 Overactive bladder: Secondary | ICD-10-CM

## 2020-03-22 DIAGNOSIS — E039 Hypothyroidism, unspecified: Secondary | ICD-10-CM

## 2020-03-22 DIAGNOSIS — I10 Essential (primary) hypertension: Secondary | ICD-10-CM | POA: Diagnosis not present

## 2020-03-22 DIAGNOSIS — Z780 Asymptomatic menopausal state: Secondary | ICD-10-CM

## 2020-03-22 DIAGNOSIS — E7849 Other hyperlipidemia: Secondary | ICD-10-CM

## 2020-03-22 DIAGNOSIS — M8580 Other specified disorders of bone density and structure, unspecified site: Secondary | ICD-10-CM

## 2020-03-22 DIAGNOSIS — E249 Cushing's syndrome, unspecified: Secondary | ICD-10-CM

## 2020-03-22 DIAGNOSIS — M25571 Pain in right ankle and joints of right foot: Secondary | ICD-10-CM

## 2020-03-22 MED ORDER — LISINOPRIL 5 MG PO TABS: ORAL_TABLET | ORAL | 1 refills | Status: DC

## 2020-03-22 MED ORDER — LEVOTHYROXINE SODIUM 75 MCG PO TABS: ORAL_TABLET | ORAL | 5 refills | Status: DC

## 2020-03-22 MED ORDER — FENOFIBRATE 160 MG PO TABS: ORAL_TABLET | ORAL | 1 refills | Status: DC

## 2020-03-22 MED ORDER — HYDROCORTISONE 10 MG PO TABS: 20.0000 mg | ORAL_TABLET | Freq: Every day | ORAL | 1 refills | Status: DC

## 2020-03-22 MED ORDER — ROSUVASTATIN CALCIUM 5 MG PO TABS: ORAL_TABLET | ORAL | 6 refills | Status: DC

## 2020-03-22 NOTE — Patient Instructions (Signed)
DASH Eating Plan DASH stands for "Dietary Approaches to Stop Hypertension." The DASH eating plan is a healthy eating plan that has been shown to reduce high blood pressure (hypertension). It may also reduce your risk for type 2 diabetes, heart disease, and stroke. The DASH eating plan may also help with weight loss. What are tips for following this plan?  General guidelines  Avoid eating more than 2,300 mg (milligrams) of salt (sodium) a day. If you have hypertension, you may need to reduce your sodium intake to 1,500 mg a day.  Limit alcohol intake to no more than 1 drink a day for nonpregnant women and 2 drinks a day for men. One drink equals 12 oz of beer, 5 oz of wine, or 1 oz of hard liquor.  Work with your health care provider to maintain a healthy body weight or to lose weight. Ask what an ideal weight is for you.  Get at least 30 minutes of exercise that causes your heart to beat faster (aerobic exercise) most days of the week. Activities may include walking, swimming, or biking.  Work with your health care provider or diet and nutrition specialist (dietitian) to adjust your eating plan to your individual calorie needs. Reading food labels   Check food labels for the amount of sodium per serving. Choose foods with less than 5 percent of the Daily Value of sodium. Generally, foods with less than 300 mg of sodium per serving fit into this eating plan.  To find whole grains, look for the word "whole" as the first word in the ingredient list. Shopping  Buy products labeled as "low-sodium" or "no salt added."  Buy fresh foods. Avoid canned foods and premade or frozen meals. Cooking  Avoid adding salt when cooking. Use salt-free seasonings or herbs instead of table salt or sea salt. Check with your health care provider or pharmacist before using salt substitutes.  Do not fry foods. Cook foods using healthy methods such as baking, boiling, grilling, and broiling instead.  Cook with  heart-healthy oils, such as olive, canola, soybean, or sunflower oil. Meal planning  Eat a balanced diet that includes: ? 5 or more servings of fruits and vegetables each day. At each meal, try to fill half of your plate with fruits and vegetables. ? Up to 6-8 servings of whole grains each day. ? Less than 6 oz of lean meat, poultry, or fish each day. A 3-oz serving of meat is about the same size as a deck of cards. One egg equals 1 oz. ? 2 servings of low-fat dairy each day. ? A serving of nuts, seeds, or beans 5 times each week. ? Heart-healthy fats. Healthy fats called Omega-3 fatty acids are found in foods such as flaxseeds and coldwater fish, like sardines, salmon, and mackerel.  Limit how much you eat of the following: ? Canned or prepackaged foods. ? Food that is high in trans fat, such as fried foods. ? Food that is high in saturated fat, such as fatty meat. ? Sweets, desserts, sugary drinks, and other foods with added sugar. ? Full-fat dairy products.  Do not salt foods before eating.  Try to eat at least 2 vegetarian meals each week.  Eat more home-cooked food and less restaurant, buffet, and fast food.  When eating at a restaurant, ask that your food be prepared with less salt or no salt, if possible. What foods are recommended? The items listed may not be a complete list. Talk with your dietitian about   what dietary choices are best for you. Grains Whole-grain or whole-wheat bread. Whole-grain or whole-wheat pasta. Brown rice. Oatmeal. Quinoa. Bulgur. Whole-grain and low-sodium cereals. Pita bread. Low-fat, low-sodium crackers. Whole-wheat flour tortillas. Vegetables Fresh or frozen vegetables (raw, steamed, roasted, or grilled). Low-sodium or reduced-sodium tomato and vegetable juice. Low-sodium or reduced-sodium tomato sauce and tomato paste. Low-sodium or reduced-sodium canned vegetables. Fruits All fresh, dried, or frozen fruit. Canned fruit in natural juice (without  added sugar). Meat and other protein foods Skinless chicken or turkey. Ground chicken or turkey. Pork with fat trimmed off. Fish and seafood. Egg whites. Dried beans, peas, or lentils. Unsalted nuts, nut butters, and seeds. Unsalted canned beans. Lean cuts of beef with fat trimmed off. Low-sodium, lean deli meat. Dairy Low-fat (1%) or fat-free (skim) milk. Fat-free, low-fat, or reduced-fat cheeses. Nonfat, low-sodium ricotta or cottage cheese. Low-fat or nonfat yogurt. Low-fat, low-sodium cheese. Fats and oils Soft margarine without trans fats. Vegetable oil. Low-fat, reduced-fat, or light mayonnaise and salad dressings (reduced-sodium). Canola, safflower, olive, soybean, and sunflower oils. Avocado. Seasoning and other foods Herbs. Spices. Seasoning mixes without salt. Unsalted popcorn and pretzels. Fat-free sweets. What foods are not recommended? The items listed may not be a complete list. Talk with your dietitian about what dietary choices are best for you. Grains Baked goods made with fat, such as croissants, muffins, or some breads. Dry pasta or rice meal packs. Vegetables Creamed or fried vegetables. Vegetables in a cheese sauce. Regular canned vegetables (not low-sodium or reduced-sodium). Regular canned tomato sauce and paste (not low-sodium or reduced-sodium). Regular tomato and vegetable juice (not low-sodium or reduced-sodium). Pickles. Olives. Fruits Canned fruit in a light or heavy syrup. Fried fruit. Fruit in cream or butter sauce. Meat and other protein foods Fatty cuts of meat. Ribs. Fried meat. Bacon. Sausage. Bologna and other processed lunch meats. Salami. Fatback. Hotdogs. Bratwurst. Salted nuts and seeds. Canned beans with added salt. Canned or smoked fish. Whole eggs or egg yolks. Chicken or turkey with skin. Dairy Whole or 2% milk, cream, and half-and-half. Whole or full-fat cream cheese. Whole-fat or sweetened yogurt. Full-fat cheese. Nondairy creamers. Whipped toppings.  Processed cheese and cheese spreads. Fats and oils Butter. Stick margarine. Lard. Shortening. Ghee. Bacon fat. Tropical oils, such as coconut, palm kernel, or palm oil. Seasoning and other foods Salted popcorn and pretzels. Onion salt, garlic salt, seasoned salt, table salt, and sea salt. Worcestershire sauce. Tartar sauce. Barbecue sauce. Teriyaki sauce. Soy sauce, including reduced-sodium. Steak sauce. Canned and packaged gravies. Fish sauce. Oyster sauce. Cocktail sauce. Horseradish that you find on the shelf. Ketchup. Mustard. Meat flavorings and tenderizers. Bouillon cubes. Hot sauce and Tabasco sauce. Premade or packaged marinades. Premade or packaged taco seasonings. Relishes. Regular salad dressings. Where to find more information:  National Heart, Lung, and Blood Institute: www.nhlbi.nih.gov  American Heart Association: www.heart.org Summary  The DASH eating plan is a healthy eating plan that has been shown to reduce high blood pressure (hypertension). It may also reduce your risk for type 2 diabetes, heart disease, and stroke.  With the DASH eating plan, you should limit salt (sodium) intake to 2,300 mg a day. If you have hypertension, you may need to reduce your sodium intake to 1,500 mg a day.  When on the DASH eating plan, aim to eat more fresh fruits and vegetables, whole grains, lean proteins, low-fat dairy, and heart-healthy fats.  Work with your health care provider or diet and nutrition specialist (dietitian) to adjust your eating plan to your   individual calorie needs. This information is not intended to replace advice given to you by your health care provider. Make sure you discuss any questions you have with your health care provider. Document Revised: 08/23/2017 Document Reviewed: 09/03/2016 Elsevier Patient Education  2020 Elsevier Inc.  

## 2020-03-22 NOTE — Progress Notes (Signed)
Subjective:    Patient ID: Stacy Jensen, female    DOB: 02/03/1956, 64 y.o.   MRN: [id number]  Hypertension This is a chronic problem. Pertinent negatives include no chest pain or shortness of breath. Treatments tried: lisinopril.   Right leg pain getting worse. Started 2 years ago. Pt states she has seen two podiatrist. Wears compression and ankle brace most of the time.  Podiatry states plate has moved and arthritis Also collapsed bone? Not getting better Wears a brace most day Results for orders placed or performed in visit on 03/14/20  Lipid panel  Result Value Ref Range   Cholesterol, Total 244 (H) 100 - 199 mg/dL   Triglycerides 188 (H) 0 - 149 mg/dL   HDL 52 >39 mg/dL   VLDL Cholesterol Cal 34 5 - 40 mg/dL   LDL Chol Calc (NIH) 158 (H) 0 - 99 mg/dL   Chol/HDL Ratio 4.7 (H) 0.0 - 4.4 ratio  Hepatic function panel  Result Value Ref Range   Total Protein 7.3 6.0 - 8.5 g/dL   Albumin 4.6 3.8 - 4.8 g/dL   Bilirubin Total 0.5 0.0 - 1.2 mg/dL   Bilirubin, Direct 0.14 0.00 - 0.40 mg/dL   Alkaline Phosphatase 48 48 - 121 IU/L   AST 28 0 - 40 IU/L   ALT 18 0 - 32 IU/L  Basic metabolic panel  Result Value Ref Range   Glucose 91 65 - 99 mg/dL   BUN 8 8 - 27 mg/dL   Creatinine, Ser 1.27 (H) 0.57 - 1.00 mg/dL   GFR calc non Af Amer 44 (L) >59 mL/min/1.73   GFR calc Af Amer 51 (L) >59 mL/min/1.73   BUN/Creatinine Ratio 6 (L) 12 - 28   Sodium 144 134 - 144 mmol/L   Potassium 4.1 3.5 - 5.2 mmol/L   Chloride 105 96 - 106 mmol/L   CO2 24 20 - 29 mmol/L   Calcium 9.5 8.7 - 10.3 mg/dL  Hemoglobin A1c  Result Value Ref Range   Hgb A1c MFr Bld 5.6 4.8 - 5.6 %   Est. average glucose Bld gHb Est-mCnc 114 mg/dL  TSH  Result Value Ref Range   TSH 4.280 0.450 - 4.500 uIU/mL  Cortisol  Result Value Ref Range   Cortisol 10.2 ug/dL   Essential hypertension, benign  Cushing syndrome (HCC)  Hypothyroidism, unspecified type  Osteopenia, unspecified location - Plan: DG Bone  Density  Overactive bladder  Other hyperlipidemia  Prediabetes  Acute right ankle pain - Plan: Ambulatory referral to Orthopedic Surgery  Post-menopausal - Plan: DG Bone Density   Review of Systems  Constitutional: Negative for activity change, appetite change and fatigue.  HENT: Negative for congestion and rhinorrhea.   Respiratory: Negative for cough and shortness of breath.   Cardiovascular: Negative for chest pain and leg swelling.  Gastrointestinal: Negative for abdominal pain and diarrhea.  Endocrine: Negative for polydipsia and polyphagia.  Skin: Negative for color change.  Neurological: Negative for dizziness and weakness.  Psychiatric/Behavioral: Negative for behavioral problems and confusion.       Objective:   Physical Exam Vitals reviewed.  Constitutional:      General: She is not in acute distress. HENT:     Head: Normocephalic and atraumatic.  Eyes:     General:        Right eye: No discharge.        Left eye: No discharge.  Neck:     Trachea: No tracheal deviation.  Cardiovascular:  Rate and Rhythm: Normal rate and regular rhythm.     Heart sounds: Normal heart sounds. No murmur heard.   Pulmonary:     Effort: Pulmonary effort is normal. No respiratory distress.     Breath sounds: Normal breath sounds.  Lymphadenopathy:     Cervical: No cervical adenopathy.  Skin:    General: Skin is warm and dry.  Neurological:     Mental Status: She is alert.     Coordination: Coordination normal.  Psychiatric:        Behavior: Behavior normal.           Assessment & Plan:  1. Essential hypertension, benign Blood pressure good control continue medication. Patient encouraged to lose weight by watching diet minimizing salt 2. Cushing syndrome ([place]) Cortisol level looks good continue hydrocortisone.  Will need to do bone density test  3. Hypothyroidism, unspecified type Thyroid function looks good continue medication take in the morning on empty  stomach  4. Osteopenia, unspecified location Check bone density patient at risk of developing osteoporosis - DG Bone Density  5. Overactive bladder States medicine helps some not interested in trying different medicine because of cost stick with current approach  6. Other hyperlipidemia Hyperlipidemia she is willing to try low-dose Crestor. We will go with 5 mg on Mondays and Fridays 7. Prediabetes A1c good control encourage patient to increase activity watch portions try to lose weight  8. Acute right ankle pain Ongoing right ankle pain has seen the specialty group with Dr. Mayer Camel before they are contemplating surgery - Ambulatory referral to Orthopedic Surgery  9. Post-menopausal Bone density - DG Bone Density Encourage patient do the best she can with her cholesterol hopefully she will tolerate statin 2 days a week  Follow-up 6 months

## 2020-03-25 ENCOUNTER — Other Ambulatory Visit: Payer: Self-pay | Admitting: Family Medicine

## 2020-03-30 ENCOUNTER — Encounter: Payer: Self-pay | Admitting: Family Medicine

## 2020-03-31 ENCOUNTER — Ambulatory Visit (HOSPITAL_COMMUNITY)
Admission: RE | Admit: 2020-03-31 | Discharge: 2020-03-31 | Disposition: A | Discharge status: Home / Self Care | Payer: BC Managed Care – PPO | Source: Ambulatory Visit | Attending: Family Medicine | Admitting: Family Medicine

## 2020-03-31 ENCOUNTER — Other Ambulatory Visit: Payer: Self-pay

## 2020-03-31 DIAGNOSIS — M8580 Other specified disorders of bone density and structure, unspecified site: Secondary | ICD-10-CM | POA: Insufficient documentation

## 2020-03-31 DIAGNOSIS — Z780 Asymptomatic menopausal state: Secondary | ICD-10-CM | POA: Diagnosis not present

## 2020-03-31 DIAGNOSIS — M8589 Other specified disorders of bone density and structure, multiple sites: Secondary | ICD-10-CM | POA: Diagnosis not present

## 2020-04-06 DIAGNOSIS — M25571 Pain in right ankle and joints of right foot: Secondary | ICD-10-CM | POA: Diagnosis not present

## 2020-04-11 ENCOUNTER — Other Ambulatory Visit: Payer: Self-pay

## 2020-04-11 ENCOUNTER — Telehealth: Payer: Self-pay | Admitting: Family Medicine

## 2020-04-11 ENCOUNTER — Ambulatory Visit: Payer: BC Managed Care – PPO | Admitting: Family Medicine

## 2020-04-11 ENCOUNTER — Encounter: Payer: Self-pay | Admitting: Family Medicine

## 2020-04-11 VITALS — BP 142/82 | Temp 97.3°F | Wt 163.8 lb

## 2020-04-11 DIAGNOSIS — M8580 Other specified disorders of bone density and structure, unspecified site: Secondary | ICD-10-CM

## 2020-04-11 DIAGNOSIS — E271 Primary adrenocortical insufficiency: Secondary | ICD-10-CM | POA: Diagnosis not present

## 2020-04-11 DIAGNOSIS — K219 Gastro-esophageal reflux disease without esophagitis: Secondary | ICD-10-CM

## 2020-04-11 HISTORY — DX: Gastro-esophageal reflux disease without esophagitis: K21.9

## 2020-04-11 NOTE — Telephone Encounter (Signed)
Pt is needing Reclast Infusion. Please advise. Thank you

## 2020-04-11 NOTE — Progress Notes (Signed)
   Subjective:    Patient ID: Stacy Jensen, female    DOB: Jul 08, 1956, 64 y.o.   MRN: [id number]  HPI Pt here to discuss bone density results.pt had bone density done on 03/31/20.  Pt states she was on med for osteopenia a while ago.    Review of Systems  Constitutional: Negative for activity change and appetite change.  HENT: Negative for congestion and rhinorrhea.   Respiratory: Negative for cough and shortness of breath.   Cardiovascular: Negative for chest pain and leg swelling.  Gastrointestinal: Negative for abdominal pain, nausea and vomiting.  Skin: Negative for color change.  Neurological: Negative for dizziness and weakness.  Psychiatric/Behavioral: Negative for agitation and confusion.       Objective:   Physical Exam Vitals reviewed.  Constitutional:      General: She is not in acute distress. HENT:     Head: Normocephalic.  Cardiovascular:     Rate and Rhythm: Normal rate and regular rhythm.     Heart sounds: Normal heart sounds. No murmur heard.   Pulmonary:     Effort: Pulmonary effort is normal.     Breath sounds: Normal breath sounds.  Lymphadenopathy:     Cervical: No cervical adenopathy.  Neurological:     Mental Status: She is alert.  Psychiatric:        Behavior: Behavior normal.           Assessment & Plan:  1. Gastroesophageal reflux disease without esophagitis Reflux under good control with Pepcid currently but because of her severe history of esophagitis issues or reflux I do not feel she is a good candidate for Fosamax.  2. Osteopenia, unspecified location Her FRAX score is elevated.  Puts her at significant risk of hip fracture and long bone fracture.  After much discussion patient agrees to treatment but does not want to utilize pill because of esophagitis history of reflux issues therefore go ahead with Reclast infusion yearly we will try to get this approved through her insurance company  3. Adrenal insufficiency (Addison's disease)  ([place]) Under glucocorticoid treatments therefore puts her at higher risk of fractures  Keep all regular follow-ups

## 2020-04-11 NOTE — Patient Instructions (Signed)
We will work on getting infusion approved  Let us know if we haven't called you within 10 to 14 days

## 2020-04-26 ENCOUNTER — Other Ambulatory Visit: Payer: Self-pay | Admitting: Orthopaedic Surgery

## 2020-04-26 DIAGNOSIS — M25571 Pain in right ankle and joints of right foot: Secondary | ICD-10-CM

## 2020-05-05 ENCOUNTER — Ambulatory Visit
Admission: RE | Admit: 2020-05-05 | Discharge: 2020-05-05 | Disposition: A | Discharge status: Home / Self Care | Payer: BC Managed Care – PPO | Source: Ambulatory Visit | Attending: Orthopaedic Surgery | Admitting: Orthopaedic Surgery

## 2020-05-05 DIAGNOSIS — M19071 Primary osteoarthritis, right ankle and foot: Secondary | ICD-10-CM | POA: Diagnosis not present

## 2020-05-05 DIAGNOSIS — M25471 Effusion, right ankle: Secondary | ICD-10-CM | POA: Diagnosis not present

## 2020-05-05 DIAGNOSIS — M25571 Pain in right ankle and joints of right foot: Secondary | ICD-10-CM

## 2020-06-13 ENCOUNTER — Telehealth: Payer: Self-pay | Admitting: Family Medicine

## 2020-06-13 NOTE — Telephone Encounter (Signed)
pt needs to schedule appt for Colonoscopy   Pt call back [phone]

## 2020-06-13 NOTE — Telephone Encounter (Signed)
Pt would like referral to [facility] for colonoscopy.   Pt also is wanting to know what she needs to do regarding taking medicine for osteopenia. Please advise. Thank you

## 2020-06-14 ENCOUNTER — Other Ambulatory Visit: Payer: Self-pay | Admitting: *Deleted

## 2020-06-14 DIAGNOSIS — Z1211 Encounter for screening for malignant neoplasm of colon: Secondary | ICD-10-CM

## 2020-06-14 NOTE — Telephone Encounter (Signed)
Go ahead with colonoscopy referral  Patient does have increased risk of hip fracture based upon her bone density.  We did discuss this on her previous visit.  Patient has severe esophagitis issues so therefore cannot use Fosamax I recommend referral for Reclast this would need to get approved through her insurance company please put in referral

## 2020-06-14 NOTE — Telephone Encounter (Signed)
Lmtc. Referral in for colonoscopy and [facility] is working on reclast referral.

## 2020-06-23 ENCOUNTER — Encounter: Payer: Self-pay | Admitting: Family Medicine

## 2020-06-23 ENCOUNTER — Telehealth: Payer: Self-pay | Admitting: Family Medicine

## 2020-06-23 NOTE — Telephone Encounter (Signed)
Called [employer] [phone], ref# [id number], was told Reclast needed prior auth & was given # to Utilization Management [id number], called [facility] (held for 38 minutes) & finally spoke with Maryruth Hancock that states NO PA required for Reclast [phone])   Ref# [id number] [facility]

## 2020-06-23 NOTE — Telephone Encounter (Signed)
Form was completed 

## 2020-06-23 NOTE — Telephone Encounter (Signed)
Please complete all highlighted areas, sign & date order for IV Reclast so it can be faxed  In red folder in basket on wall

## 2020-06-27 ENCOUNTER — Encounter (INDEPENDENT_AMBULATORY_CARE_PROVIDER_SITE_OTHER): Payer: Self-pay

## 2020-07-08 ENCOUNTER — Encounter (HOSPITAL_COMMUNITY)
Admission: RE | Admit: 2020-07-08 | Discharge: 2020-07-08 | Disposition: A | Discharge status: Home / Self Care | Payer: BC Managed Care – PPO | Source: Ambulatory Visit | Attending: Family Medicine | Admitting: Family Medicine

## 2020-07-08 ENCOUNTER — Other Ambulatory Visit: Payer: Self-pay

## 2020-07-08 ENCOUNTER — Encounter (HOSPITAL_COMMUNITY): Payer: Self-pay

## 2020-07-08 DIAGNOSIS — M810 Age-related osteoporosis without current pathological fracture: Secondary | ICD-10-CM | POA: Insufficient documentation

## 2020-07-08 HISTORY — DX: Anemia, unspecified: D64.9

## 2020-07-08 HISTORY — DX: Unspecified osteoarthritis, unspecified site: M19.90

## 2020-07-08 HISTORY — DX: Cushing's syndrome, unspecified: E24.9

## 2020-07-08 HISTORY — DX: Age-related osteoporosis without current pathological fracture: M81.0

## 2020-07-08 LAB — POCT I-STAT, CHEM 8
BUN: 12 mg/dL (ref 8–23)
Calcium, Ion: 1.21 mmol/L (ref 1.15–1.40)
Chloride: 106 mmol/L (ref 98–111)
Creatinine, Ser: 1.1 mg/dL — ABNORMAL HIGH (ref 0.44–1.00)
Glucose, Bld: 103 mg/dL — ABNORMAL HIGH (ref 70–99)
HCT: 49 % — ABNORMAL HIGH (ref 36.0–46.0)
Hemoglobin: 16.7 g/dL — ABNORMAL HIGH (ref 12.0–15.0)
Potassium: 3.6 mmol/L (ref 3.5–5.1)
Sodium: 146 mmol/L — ABNORMAL HIGH (ref 135–145)
TCO2: 28 mmol/L (ref 22–32)

## 2020-07-08 MED ORDER — ZOLEDRONIC ACID 5 MG/100ML IV SOLN
5.0000 mg | Freq: Once | INTRAVENOUS | Status: AC
  Administered 2020-07-08: 5 mg via INTRAVENOUS

## 2020-07-08 MED ORDER — SODIUM CHLORIDE 0.9 % IV SOLN: Freq: Once | INTRAVENOUS | Status: AC

## 2020-07-08 NOTE — Discharge Instructions (Signed)

## 2020-09-09 ENCOUNTER — Encounter: Payer: Self-pay | Admitting: Family Medicine

## 2020-09-09 ENCOUNTER — Ambulatory Visit: Payer: BC Managed Care – PPO | Admitting: Family Medicine

## 2020-09-09 ENCOUNTER — Other Ambulatory Visit: Payer: Self-pay

## 2020-09-09 VITALS — BP 128/82 | HR 91 | Temp 98.3°F | Ht 60.0 in | Wt 160.4 lb

## 2020-09-09 DIAGNOSIS — R300 Dysuria: Secondary | ICD-10-CM | POA: Diagnosis not present

## 2020-09-09 DIAGNOSIS — N3000 Acute cystitis without hematuria: Secondary | ICD-10-CM | POA: Insufficient documentation

## 2020-09-09 DIAGNOSIS — R112 Nausea with vomiting, unspecified: Secondary | ICD-10-CM | POA: Diagnosis not present

## 2020-09-09 LAB — POCT URINALYSIS DIPSTICK
Nitrite, UA: POSITIVE
Spec Grav, UA: 1.02 (ref 1.010–1.025)
pH, UA: 6 (ref 5.0–8.0)

## 2020-09-09 MED ORDER — CIPROFLOXACIN HCL 500 MG PO TABS: 500.0000 mg | ORAL_TABLET | Freq: Two times a day (BID) | ORAL | 0 refills | Status: DC

## 2020-09-09 MED ORDER — ONDANSETRON 8 MG PO TBDP: 8.0000 mg | ORAL_TABLET | Freq: Three times a day (TID) | ORAL | 1 refills | Status: DC | PRN

## 2020-09-09 NOTE — Patient Instructions (Signed)
Pyelonephritis, Adult  Pyelonephritis is an infection that occurs in the kidney. The kidneys are organs that help clean the blood by moving waste out of the blood and into the pee (urine). This infection can happen quickly, or it can last for a long time. In most cases, it clears up with treatment and does not cause other problems. What are the causes? This condition may be caused by:  Germs (bacteria) going from the bladder up to the kidney. This may happen after having a bladder infection.  Germs going from the blood to the kidney. What increases the risk? This condition is more likely to develop in:  Pregnant women.  Older people.  People who have any of these conditions: ? Diabetes. ? Inflammation of the prostate gland (prostatitis), in males. ? Kidney stones or bladder stones. ? Other problems with the kidney or the parts of your body that carry pee from the kidneys to the bladder (ureters). ? Cancer.  People who have a small, thin tube (catheter) placed in the bladder.  People who are sexually active.  Women who use a medicine that kills sperm (spermicide) to prevent pregnancy.  People who have had a prior urinary tract infection (UTI). What are the signs or symptoms? Symptoms of this condition include:  Peeing often.  A strong urge to pee right away.  Burning or stinging when peeing.  Belly pain.  Back pain.  Pain in the side (flank area).  Fever or chills.  Blood in the pee, or dark pee.  Feeling sick to your stomach (nauseous) or throwing up (vomiting). How is this treated? This condition may be treated by:  Taking antibiotic medicines by mouth (orally).  Drinking enough fluids. If the infection is bad, you may need to stay in the hospital. You may be given antibiotics and fluids that are put directly into a vein through an IV tube. In some cases, other treatments may be needed. Follow these instructions at home: Medicines  Take your antibiotic  medicine as told by your doctor. Do not stop taking the antibiotic even if you start to feel better.  Take over-the-counter and prescription medicines only as told by your doctor. General instructions   Drink enough fluid to keep your pee pale yellow.  Avoid caffeine, tea, and carbonated drinks.  Pee (urinate) often. Avoid holding in pee for long periods of time.  Pee before and after sex.  After pooping (having a bowel movement), women should wipe from front to back. Use each tissue only once.  Keep all follow-up visits as told by your doctor. This is important. Contact a doctor if:  You do not feel better after 2 days.  Your symptoms get worse.  You have a fever. Get help right away if:  You cannot take your medicine or drink fluids as told.  You have chills and shaking.  You throw up.  You have very bad pain in your side or back.  You feel very weak or you pass out (faint). Summary  Pyelonephritis is an infection that occurs in the kidney.  In most cases, this infection clears up with treatment and does not cause other problems.  Take your antibiotic medicine as told by your doctor. Do not stop taking the antibiotic even if you start to feel better.  Drink enough fluid to keep your pee pale yellow. This information is not intended to replace advice given to you by your health care provider. Make sure you discuss any questions you have with  your health care provider. Document Revised: 07/15/2018 Document Reviewed: 07/15/2018 Elsevier Patient Education  2020 Elsevier Inc.  

## 2020-09-09 NOTE — Progress Notes (Signed)
Patient ID: Stacy Jensen, female    DOB: June 04, 1956, 64 y.o.   MRN: [id number]   Chief Complaint  Patient presents with  . Dysuria    For few days - but got worked last night- noticed blood when wiped a few times yesterday   Subjective:  CC: urinary burning and back pain   This is a new problem.  Presents today for same-day acute visit with a complaint of painful urination.  Reports that she saw blood with wiping x3.  Associated symptoms include fever, chills, nausea, vomiting.  Symptoms started yesterday afternoon.  Reports that she has a history of what sounds like urosepsis, which required a 6-day hospitalization.  Also reports back pain, no significant burning with urination.  She is accompanied today by her sister.    Medical History Rowyn has a past medical history of Anemia, Arthritis, Cushing's syndrome ([place]), GERD (gastroesophageal reflux disease) (04/11/2020), Hypertension, Hypertriglyceridemia, Osteoporosis, Pituitary Cushing syndrome ([place]), Sleep apnea (2007), and Type 2 diabetes mellitus ([place]) (2008).   Outpatient Encounter Medications as of 09/09/2020  Medication Sig  . Calcium Citrate (CITRACAL PO) Take by mouth daily.  . ciprofloxacin (CIPRO) 500 MG tablet Take 1 tablet (500 mg total) by mouth 2 (two) times daily.  Marland Kitchen co-enzyme Q-10 30 MG capsule Take 30 mg by mouth 3 (three) times daily.  Marland Kitchen ezetimibe (ZETIA) 10 MG tablet Take 1 tablet by mouth once daily  . famotidine (PEPCID) 40 MG tablet Take one tablet by mouth once a day  . fenofibrate 160 MG tablet TAKE 1 TABLET BY MOUTH ONCE DAILY. NEEDS VIRTUAL VISIT IN MAY  . fluticasone (FLONASE) 50 MCG/ACT nasal spray USE 1 SPRAY(S) IN EACH NOSTRIL ONCE DAILY  . hydrocortisone (CORTEF) 10 MG tablet Take 2 tablets (20 mg total) by mouth daily. with food  . levothyroxine (SYNTHROID) 75 MCG tablet TAKE 1 TABLET BY MOUTH ONCE DAILY TAKE  5  DAYS  A  WEEK  . lisinopril (ZESTRIL) 5 MG tablet TAKE 1 TABLET BY MOUTH DAILY  . Multiple  Vitamins-Minerals (MULTIVITAMIN WITH MINERALS) tablet Take 1 tablet by mouth daily.  . Omega-3 Fatty Acids (OMEGA-3 FISH OIL) 1200 MG CAPS Take by mouth daily.   . ondansetron (ZOFRAN ODT) 8 MG disintegrating tablet Take 1 tablet (8 mg total) by mouth every 8 (eight) hours as needed for nausea or vomiting.  Marland Kitchen OVER THE COUNTER MEDICATION magnessium 250mg  daily  b12 5,000 mcg daily  Colon clenz one daly as needed  Apple cider vinegar. One oz shots 2 -3 times daily  . oxybutynin (DITROPAN) 5 MG tablet Take 1 tablet by mouth twice daily  . rosuvastatin (CRESTOR) 5 MG tablet Take 1 on Monday take 1 on Friday  . zoledronic acid (RECLAST) 5 MG/100ML SOLN injection Inject 5 mg into the vein once.   No facility-administered encounter medications on file as of 09/09/2020.     Review of Systems  Constitutional: Positive for chills and fever.  Respiratory: Negative for shortness of breath.   Cardiovascular: Negative for chest pain.  Gastrointestinal: Positive for nausea and vomiting. Negative for abdominal pain.  Genitourinary: Positive for dysuria and frequency.  Musculoskeletal: Positive for back pain.  Neurological: Positive for headaches.     Vitals BP 128/82   Pulse 91   Temp 98.3 F (36.8 C) (Oral)   Ht 5' (1.524 m)   Wt 160 lb 6.4 oz (72.8 kg)   SpO2 99%   BMI 31.33 kg/m   Objective:   Physical Exam  Vitals and nursing note reviewed.  Constitutional:      General: She is not in acute distress.    Appearance: Normal appearance. She is not toxic-appearing.  Cardiovascular:     Rate and Rhythm: Normal rate and regular rhythm.     Heart sounds: Normal heart sounds.  Pulmonary:     Effort: Pulmonary effort is normal.     Breath sounds: Normal breath sounds.  Abdominal:     Tenderness: There is right CVA tenderness. There is no left CVA tenderness, guarding or rebound.  Skin:    General: Skin is warm and dry.  Neurological:     General: No focal deficit present.      Mental Status: She is alert.  Psychiatric:        Behavior: Behavior normal.     Results for orders placed or performed in visit on 09/09/20  POCT urinalysis dipstick  Result Value Ref Range   Color, UA     Clarity, UA     Glucose, UA     Bilirubin, UA     Ketones, UA     Spec Grav, UA 1.020 1.010 - 1.025   Blood, UA     pH, UA 6.0 5.0 - 8.0   Protein, UA     Urobilinogen, UA     Nitrite, UA positive    Leukocytes, UA Moderate (2+) (A) Negative   Appearance     Odor      Assessment and Plan   1. Dysuria - POCT urinalysis dipstick - Urine Culture  2. Acute cystitis without hematuria - ciprofloxacin (CIPRO) 500 MG tablet; Take 1 tablet (500 mg total) by mouth 2 (two) times daily.  Dispense: 20 tablet; Refill: 0 - Urine Culture  3. Nausea and vomiting, intractability of vomiting not specified, unspecified vomiting type - ondansetron (ZOFRAN ODT) 8 MG disintegrating tablet; Take 1 tablet (8 mg total) by mouth every 8 (eight) hours as needed for nausea or vomiting.  Dispense: 20 tablet; Refill: 1    Sitting in the exam room, does not look toxic or acutely ill, however, with history of hospitalization due to urinary tract infection, will treat with ciprofloxacin today.  Her urine today was extremely cloudy, had  sediment, and a strong odor.  Will send for culture.    While in the room she did experience vomiting, reports that she feels better once this occurs, will give her Zofran to control this.  Sister understands to keep an eye on her, if she develops any increasing symptoms with fever, rigors, increasing pain, increasing vomiting please go to the emergency room immediately.  Agrees with plan of care discussed today. Understands warning signs to seek further care: Chest pain, shortness of breath, increased vomiting, increased pain, any worsening of symptoms report to the emergency department immediately.  Reiterated the risk of pyelonephritis, how serious this infection is,  and to not ignore any symptoms.  Warning signs discussed concerning ciprofloxacin.  She reports that she routinely has heel pain. Understands to follow-up if symptoms do not improve, or worsen.

## 2020-09-11 DIAGNOSIS — R112 Nausea with vomiting, unspecified: Secondary | ICD-10-CM | POA: Insufficient documentation

## 2020-09-12 ENCOUNTER — Telehealth: Payer: Self-pay | Admitting: Family Medicine

## 2020-09-12 NOTE — Telephone Encounter (Signed)
Contacted pt. Pt states she "feels like a real boy today". Pt states she is feeling much better. Taking antibiotics as prescribed. Did have to take a nausea pill yesterday but the nausea has resolved.

## 2020-09-15 LAB — URINE CULTURE

## 2020-09-19 ENCOUNTER — Telehealth (INDEPENDENT_AMBULATORY_CARE_PROVIDER_SITE_OTHER): Payer: BC Managed Care – PPO | Admitting: Family Medicine

## 2020-09-19 ENCOUNTER — Telehealth: Payer: Self-pay | Admitting: Family Medicine

## 2020-09-19 ENCOUNTER — Encounter: Payer: Self-pay | Admitting: Family Medicine

## 2020-09-19 DIAGNOSIS — R7303 Prediabetes: Secondary | ICD-10-CM

## 2020-09-19 DIAGNOSIS — E271 Primary adrenocortical insufficiency: Secondary | ICD-10-CM

## 2020-09-19 DIAGNOSIS — N3000 Acute cystitis without hematuria: Secondary | ICD-10-CM | POA: Diagnosis not present

## 2020-09-19 DIAGNOSIS — Z20822 Contact with and (suspected) exposure to covid-19: Secondary | ICD-10-CM | POA: Diagnosis not present

## 2020-09-19 DIAGNOSIS — E039 Hypothyroidism, unspecified: Secondary | ICD-10-CM

## 2020-09-19 DIAGNOSIS — E7849 Other hyperlipidemia: Secondary | ICD-10-CM

## 2020-09-19 DIAGNOSIS — I10 Essential (primary) hypertension: Secondary | ICD-10-CM

## 2020-09-19 MED ORDER — AMOXICILLIN-POT CLAVULANATE 875-125 MG PO TABS: 1.0000 | ORAL_TABLET | Freq: Two times a day (BID) | ORAL | 0 refills | Status: DC

## 2020-09-19 NOTE — Progress Notes (Signed)
Pt was seen on 09/09/20 for UTI. Took Cipro and was feeling better. Pt states she took an at home dip test and was positive for UTI. Pt states that she has a history of UTI going in blood system and ends up in hospital. Pt states that she is not having any new symptoms. (Pt husband sent home from work with [place] and pt is going to get tested in [place] at 12:40 pm)  Virtual Visit via Telephone Note  I connected with Nadara Eaton on 09/19/20 at  2:30 PM EST by telephone and verified that I am speaking with the correct person using two identifiers.  Location: Patient: home Provider: office   I discussed the limitations, risks, security and privacy concerns of performing an evaluation and management service by telephone and the availability of in person appointments. I also discussed with the patient that there may be a patient responsible charge related to this service. The patient expressed understanding and agreed to proceed.   History of Present Illness:    Observations/Objective:   Assessment and Plan:   Follow Up Instructions:    I discussed the assessment and treatment plan with the patient. The patient was provided an opportunity to ask questions and all were answered. The patient agreed with the plan and demonstrated an understanding of the instructions.   The patient was advised to call back or seek an in-person evaluation if the symptoms worsen or if the condition fails to improve as anticipated.  I provided 10 minutes of non-face-to-face time during this encounter.        Patient ID: Stacy Jensen, female    DOB: 05/20/1956, 64 y.o.   MRN: [id number]   Chief Complaint  Patient presents with  . Urinary Tract Infection   Subjective:  CC: home test for UTI- still positive, husband Covid positive on Monday  This is not a new problem.  Was originally seen on December 17 with a complaint of urinary tract infection, with flank pain.  Cipro prescribed at that time,  she has completed this course, reports that she still has low back discomfort and took a home urine test which shows she still has a urinary tract infection.  She has improved since December 17, but she is not 100%.  She denies fever, chills, shortness of breath, congestion, cough, sore throat, fatigue.  She also reports that her husband was sent home to work on Monday and he has now tested positive for Covid.  She has an appointment later today to be tested for Covid, she will let the office know if this is positive, as she was in her office on December 17.  Her urine culture from December 17 grew E. Coli.    Medical History Fallen has a past medical history of Anemia, Arthritis, Cushing's syndrome ([place]), GERD (gastroesophageal reflux disease) (04/11/2020), Hypertension, Hypertriglyceridemia, Osteoporosis, Pituitary Cushing syndrome ([place]), Sleep apnea (2007), and Type 2 diabetes mellitus ([place]) (2008).   Outpatient Encounter Medications as of 09/19/2020  Medication Sig  . amoxicillin-clavulanate (AUGMENTIN) 875-125 MG tablet Take 1 tablet by mouth 2 (two) times daily.  . Calcium Citrate (CITRACAL PO) Take by mouth daily.  Marland Kitchen co-enzyme Q-10 30 MG capsule Take 30 mg by mouth 3 (three) times daily.  Marland Kitchen ezetimibe (ZETIA) 10 MG tablet Take 1 tablet by mouth once daily  . famotidine (PEPCID) 40 MG tablet Take one tablet by mouth once a day  . fenofibrate 160 MG tablet TAKE 1 TABLET BY MOUTH ONCE DAILY. NEEDS VIRTUAL  VISIT IN MAY  . fluticasone (FLONASE) 50 MCG/ACT nasal spray USE 1 SPRAY(S) IN EACH NOSTRIL ONCE DAILY  . hydrocortisone (CORTEF) 10 MG tablet Take 2 tablets (20 mg total) by mouth daily. with food  . levothyroxine (SYNTHROID) 75 MCG tablet TAKE 1 TABLET BY MOUTH ONCE DAILY TAKE  5  DAYS  A  WEEK  . lisinopril (ZESTRIL) 5 MG tablet TAKE 1 TABLET BY MOUTH DAILY  . Multiple Vitamins-Minerals (MULTIVITAMIN WITH MINERALS) tablet Take 1 tablet by mouth daily.  . Omega-3 Fatty Acids (OMEGA-3 FISH  OIL) 1200 MG CAPS Take by mouth daily.   . ondansetron (ZOFRAN ODT) 8 MG disintegrating tablet Take 1 tablet (8 mg total) by mouth every 8 (eight) hours as needed for nausea or vomiting.  Marland Kitchen OVER THE COUNTER MEDICATION magnessium 250mg  daily  b12 5,000 mcg daily  Colon clenz one daly as needed  Apple cider vinegar. One oz shots 2 -3 times daily  . oxybutynin (DITROPAN) 5 MG tablet Take 1 tablet by mouth twice daily  . rosuvastatin (CRESTOR) 5 MG tablet Take 1 on Monday take 1 on Friday  . zoledronic acid (RECLAST) 5 MG/100ML SOLN injection Inject 5 mg into the vein once.  . [DISCONTINUED] ciprofloxacin (CIPRO) 500 MG tablet Take 1 tablet (500 mg total) by mouth 2 (two) times daily.   No facility-administered encounter medications on file as of 09/19/2020.     Review of Systems  Constitutional: Negative for chills, fatigue and fever.  HENT: Negative for ear pain and sore throat.   Respiratory: Negative for cough and shortness of breath.   Cardiovascular: Negative for chest pain.  Gastrointestinal: Negative for abdominal pain.  Genitourinary: Positive for flank pain.     Vitals There were no vitals taken for this visit. unable Objective:   Physical Exam unable  Assessment and Plan   1. Acute cystitis without hematuria - amoxicillin-clavulanate (AUGMENTIN) 875-125 MG tablet; Take 1 tablet by mouth 2 (two) times daily.  Dispense: 20 tablet; Refill: 0  2. Close exposure to COVID-19 virus   Urine culture from December 17, grew E. coli, Cipro sensitive to  E. Coli, but continues to have symptoms, will change antibiotic.  She has a history of pyelonephritis, where she was hospitalized, will have a low threshold to treat ongoing symptoms.  She will take Augmentin twice per day for 10 days.  She will notify the office if her Covid test is positive, she has an appointment at our office on September 28, 2020, this will impact whether or not she can keep this appointment.   Covid-19  respiratory warning: Covid-19 is a virus that causes hypoxia (low oxygen level in blood) in some people. If you develop any changes in your usual breathing pattern: difficulty catching your breath, more short winded with activity or with resting, or anything that concerns you about your breathing, do not hesitate to go to the emergency department immediately for evaluation. Please do not delay to get treatment.   Agrees with plan of care discussed today. Understands warning signs to seek further care: Fever, chills, rigors, worsening back pain.  Covid warning as stated above given to patient. Understands to follow-up if symptoms do not improve, or worsen.   September 30, 2020, NP 09/19/2020

## 2020-09-19 NOTE — Telephone Encounter (Signed)
Cortisol, TSH, A1c, lipid, liver, metabolic 7 Adrenal insufficiency hypothyroidism hyperlipidemia prediabetes HTN

## 2020-09-19 NOTE — Telephone Encounter (Signed)
Pt has 6 month follow up scheduled for 09/28/20. Pt wanting to know if she needs lab work. Last labs completed 03/17/20 Cortisol, TSH, A1C, BMET, Hepatic and Lipid. Please advise. Thank you.

## 2020-09-19 NOTE — Telephone Encounter (Signed)
Ms. vallorie, niccoli are scheduled for a virtual visit with your provider today.    Just as we do with appointments in the office, we must obtain your consent to participate.  Your consent will be active for this visit and any virtual visit you may have with one of our providers in the next 365 days.    If you have a MyChart account, I can also send a copy of this consent to you electronically.  All virtual visits are billed to your insurance company just like a traditional visit in the office.  As this is a virtual visit, video technology does not allow for your provider to perform a traditional examination.  This may limit your provider's ability to fully assess your condition.  If your provider identifies any concerns that need to be evaluated in person or the need to arrange testing such as labs, EKG, etc, we will make arrangements to do so.    Although advances in technology are sophisticated, we cannot ensure that it will always work on either your end or our end.  If the connection with a video visit is poor, we may have to switch to a telephone visit.  With either a video or telephone visit, we are not always able to ensure that we have a secure connection.   I need to obtain your verbal consent now.   Are you willing to proceed with your visit today?   Nakeya Adinolfi has provided verbal consent on 09/19/2020 for a virtual visit (video or telephone).   Marlowe Shores, LPN [id number]  9:10 AM

## 2020-09-20 NOTE — Telephone Encounter (Signed)
Lab orders placed and pt is aware 

## 2020-09-20 NOTE — Addendum Note (Signed)
Addended by: Marlowe Shores on: 09/20/2020 09:50 AM   Modules accepted: Orders

## 2020-09-22 ENCOUNTER — Telehealth: Payer: Self-pay | Admitting: Family Medicine

## 2020-09-22 NOTE — Telephone Encounter (Signed)
Pt called into office and states that COVID test came back negative. Pt was seen recently for UTI and had COVID test in [place].

## 2020-09-26 DIAGNOSIS — I10 Essential (primary) hypertension: Secondary | ICD-10-CM | POA: Diagnosis not present

## 2020-09-26 DIAGNOSIS — E039 Hypothyroidism, unspecified: Secondary | ICD-10-CM | POA: Diagnosis not present

## 2020-09-26 DIAGNOSIS — E271 Primary adrenocortical insufficiency: Secondary | ICD-10-CM | POA: Diagnosis not present

## 2020-09-26 DIAGNOSIS — R7303 Prediabetes: Secondary | ICD-10-CM | POA: Diagnosis not present

## 2020-09-26 DIAGNOSIS — E7849 Other hyperlipidemia: Secondary | ICD-10-CM | POA: Diagnosis not present

## 2020-09-27 LAB — LIPID PANEL
Chol/HDL Ratio: 7.5 ratio — ABNORMAL HIGH (ref 0.0–4.4)
Cholesterol, Total: 306 mg/dL — ABNORMAL HIGH (ref 100–199)
HDL: 41 mg/dL (ref >39)
LDL Chol Calc (NIH): 195 mg/dL — ABNORMAL HIGH (ref 0–99)
Triglycerides: 347 mg/dL — ABNORMAL HIGH (ref 0–149)
VLDL Cholesterol Cal: 70 mg/dL — ABNORMAL HIGH (ref 5–40)

## 2020-09-27 LAB — HEPATIC FUNCTION PANEL
ALT: 12 IU/L (ref 0–32)
AST: 17 IU/L (ref 0–40)
Albumin: 4.3 g/dL (ref 3.8–4.8)
Alkaline Phosphatase: 77 IU/L (ref 44–121)
Bilirubin Total: 0.4 mg/dL (ref 0.0–1.2)
Bilirubin, Direct: 0.11 mg/dL (ref 0.00–0.40)
Total Protein: 7.2 g/dL (ref 6.0–8.5)

## 2020-09-27 LAB — BASIC METABOLIC PANEL
BUN/Creatinine Ratio: 7 — ABNORMAL LOW (ref 12–28)
BUN: 7 mg/dL — ABNORMAL LOW (ref 8–27)
CO2: 27 mmol/L (ref 20–29)
Calcium: 9.3 mg/dL (ref 8.7–10.3)
Chloride: 106 mmol/L (ref 96–106)
Creatinine, Ser: 1 mg/dL (ref 0.57–1.00)
GFR calc Af Amer: 68 mL/min/{1.73_m2} (ref >59)
GFR calc non Af Amer: 59 mL/min/{1.73_m2} — ABNORMAL LOW (ref >59)
Glucose: 94 mg/dL (ref 65–99)
Potassium: 4.1 mmol/L (ref 3.5–5.2)
Sodium: 145 mmol/L — ABNORMAL HIGH (ref 134–144)

## 2020-09-27 LAB — HEMOGLOBIN A1C
Est. average glucose Bld gHb Est-mCnc: 126 mg/dL
Hgb A1c MFr Bld: 6 % — ABNORMAL HIGH (ref 4.8–5.6)

## 2020-09-27 LAB — TSH: TSH: 2.28 u[IU]/mL (ref 0.450–4.500)

## 2020-09-27 LAB — CORTISOL: Cortisol: 7.2 ug/dL

## 2020-09-28 ENCOUNTER — Encounter: Payer: Self-pay | Admitting: Family Medicine

## 2020-09-28 ENCOUNTER — Other Ambulatory Visit: Payer: Self-pay

## 2020-09-28 ENCOUNTER — Ambulatory Visit (INDEPENDENT_AMBULATORY_CARE_PROVIDER_SITE_OTHER): Payer: BC Managed Care – PPO | Admitting: Family Medicine

## 2020-09-28 VITALS — BP 136/86 | HR 102 | Temp 97.4°F | Wt 160.8 lb

## 2020-09-28 DIAGNOSIS — E7849 Other hyperlipidemia: Secondary | ICD-10-CM

## 2020-09-28 DIAGNOSIS — E271 Primary adrenocortical insufficiency: Secondary | ICD-10-CM

## 2020-09-28 DIAGNOSIS — R7303 Prediabetes: Secondary | ICD-10-CM | POA: Diagnosis not present

## 2020-09-28 DIAGNOSIS — I10 Essential (primary) hypertension: Secondary | ICD-10-CM

## 2020-09-28 DIAGNOSIS — E039 Hypothyroidism, unspecified: Secondary | ICD-10-CM | POA: Diagnosis not present

## 2020-09-28 DIAGNOSIS — R062 Wheezing: Secondary | ICD-10-CM

## 2020-09-28 MED ORDER — ROSUVASTATIN CALCIUM 5 MG PO TABS
ORAL_TABLET | ORAL | 5 refills | Status: DC
Start: 2020-09-28 — End: 2021-09-29

## 2020-09-28 MED ORDER — HYDROCORTISONE 10 MG PO TABS
20.0000 mg | ORAL_TABLET | Freq: Every day | ORAL | 1 refills | Status: DC
Start: 2020-09-28 — End: 2020-11-14

## 2020-09-28 MED ORDER — LISINOPRIL 5 MG PO TABS: ORAL_TABLET | ORAL | 1 refills | Status: DC

## 2020-09-28 MED ORDER — FENOFIBRATE 160 MG PO TABS: ORAL_TABLET | ORAL | 1 refills | Status: DC

## 2020-09-28 MED ORDER — ALBUTEROL SULFATE HFA 108 (90 BASE) MCG/ACT IN AERS: 2.0000 | INHALATION_SPRAY | Freq: Four times a day (QID) | RESPIRATORY_TRACT | 2 refills | Status: DC | PRN

## 2020-09-28 MED ORDER — FAMOTIDINE 40 MG PO TABS: ORAL_TABLET | ORAL | 1 refills | Status: DC

## 2020-09-28 MED ORDER — LEVOTHYROXINE SODIUM 75 MCG PO TABS: ORAL_TABLET | ORAL | 1 refills | Status: DC

## 2020-09-28 MED ORDER — OXYBUTYNIN CHLORIDE 5 MG PO TABS
5.0000 mg | ORAL_TABLET | Freq: Two times a day (BID) | ORAL | 5 refills | Status: DC
Start: 2020-09-28 — End: 2021-09-29

## 2020-09-28 MED ORDER — EZETIMIBE 10 MG PO TABS
10.0000 mg | ORAL_TABLET | Freq: Every day | ORAL | 1 refills | Status: DC
Start: 2020-09-28 — End: 2021-09-29

## 2020-09-28 NOTE — Progress Notes (Signed)
Subjective:    Patient ID: Stacy Jensen, female    DOB: December 07, 1955, 65 y.o.   MRN: [id number]  Hypertension This is a chronic problem. Pertinent negatives include no chest pain or shortness of breath. There are no compliance problems.   pt has been wheezing about 5-6 months; does not happen all time. Happens when pt "is completley clear"  Essential hypertension, benign  Hypothyroidism, unspecified type  Prediabetes  Adrenal insufficiency (Addison's disease) (HCC)  Other hyperlipidemia  Patient denies any high fever chills sweats denies any persistent coughing denies hemoptysis states when she moves around she does not get short of breath with normal activity.  She does take her medication for adrenal insufficiency denies any N/V energy issues  Patient has thyroid condition.  Takes thyroid medication on a regular basis.  States that the proper way.  Relates compliance.  States no negative side effects.  States condition seems to be under good control.  Patient here for follow-up regarding cholesterol.  The patient does have hyperlipidemia.  Patient does try to maintain a reasonable diet.  Patient does take the medication on a regular basis.  Denies missing a dose.  The patient denies any obvious side effects.  Prior blood work results reviewed with the patient.  The patient is aware of his cholesterol goals and the need to keep it under good control to lessen the risk of disease. She does not do well with higher doses of cholesterol medicine only tolerates low doses Patient for blood pressure check up.  The patient does have hypertension.  The patient is on medication.  Patient relates compliance with meds. Todays BP reviewed with the patient. Patient denies issues with medication. Patient relates reasonable diet. Patient tries to minimize salt. Patient aware of BP goals.    Review of Systems  Constitutional: Negative for activity change, appetite change and fatigue.  HENT: Negative  for congestion and rhinorrhea.   Respiratory: Negative for cough and shortness of breath.   Cardiovascular: Negative for chest pain and leg swelling.  Gastrointestinal: Negative for abdominal pain and diarrhea.  Endocrine: Negative for polydipsia and polyphagia.  Skin: Negative for color change.  Neurological: Negative for dizziness and weakness.  Psychiatric/Behavioral: Negative for behavioral problems and confusion.       Objective:   Physical Exam Vitals reviewed.  Constitutional:      General: She is not in acute distress.    Appearance: She is well-nourished.  HENT:     Head: Normocephalic and atraumatic.  Eyes:     General:        Right eye: No discharge.        Left eye: No discharge.  Neck:     Trachea: No tracheal deviation.  Cardiovascular:     Rate and Rhythm: Normal rate and regular rhythm.     Heart sounds: Normal heart sounds. No murmur heard.   Pulmonary:     Effort: Pulmonary effort is normal. No respiratory distress.     Breath sounds: Normal breath sounds.  Musculoskeletal:        General: No edema.  Lymphadenopathy:     Cervical: No cervical adenopathy.  Skin:    General: Skin is warm and dry.  Neurological:     Mental Status: She is alert.     Coordination: Coordination normal.  Psychiatric:        Mood and Affect: Mood and affect normal.        Behavior: Behavior normal.  Assessment & Plan:  1. Essential hypertension, benign Blood pressure good control continue current measures watch salt in diet stay physically active  2. Hypothyroidism, unspecified type Takes her thyroid medicine on a regular basis recent lab work looks good continue current measures  3. Prediabetes A1c respectable minimize sugars in the diet stay physically active try to watch portions keep weight down  4. Adrenal insufficiency (Addison's disease) (HCC) Mild adrenal insufficiency continue current medications follow-up sooner if any particular problems  recheck in approximately 5 to 6 months  5. Other hyperlipidemia Does not tolerate high-dose statins will stick with low-dose statins along with Zetia still has significant issues with her cholesterol  6. Wheeze Intermittent wheezing none today no significant shortness of breath I gave the patient options regarding seeing pulmonary to do pulmonary function test and a chest x-ray or just watching she chooses at this point in time to just watch this

## 2020-09-28 NOTE — Patient Instructions (Addendum)
Results for orders placed or performed in visit on 09/19/20  Cortisol  Result Value Ref Range   Cortisol 7.2 ug/dL  TSH  Result Value Ref Range   TSH 2.280 0.450 - 4.500 uIU/mL  Hemoglobin A1c  Result Value Ref Range   Hgb A1c MFr Bld 6.0 (H) 4.8 - 5.6 %   Est. average glucose Bld gHb Est-mCnc 126 mg/dL  Lipid Profile  Result Value Ref Range   Cholesterol, Total 306 (H) 100 - 199 mg/dL   Triglycerides 762 (H) 0 - 149 mg/dL   HDL 41 >83 mg/dL   VLDL Cholesterol Cal 70 (H) 5 - 40 mg/dL   LDL Chol Calc (NIH) 151 (H) 0 - 99 mg/dL   Chol/HDL Ratio 7.5 (H) 0.0 - 4.4 ratio  Hepatic function panel  Result Value Ref Range   Total Protein 7.2 6.0 - 8.5 g/dL   Albumin 4.3 3.8 - 4.8 g/dL   Bilirubin Total 0.4 0.0 - 1.2 mg/dL   Bilirubin, Direct 7.61 0.00 - 0.40 mg/dL   Alkaline Phosphatase 77 44 - 121 IU/L   AST 17 0 - 40 IU/L   ALT 12 0 - 32 IU/L  Basic Metabolic Panel (BMET)  Result Value Ref Range   Glucose 94 65 - 99 mg/dL   BUN 7 (L) 8 - 27 mg/dL   Creatinine, Ser 6.07 0.57 - 1.00 mg/dL   GFR calc non Af Amer 59 (L) >59 mL/min/1.73   GFR calc Af Amer 68 >59 mL/min/1.73   BUN/Creatinine Ratio 7 (L) 12 - 28   Sodium 145 (H) 134 - 144 mmol/L   Potassium 4.1 3.5 - 5.2 mmol/L   Chloride 106 96 - 106 mmol/L   CO2 27 20 - 29 mmol/L   Calcium 9.3 8.7 - 10.3 mg/dL     This is some additional information regarding Covid vaccine.  The Covid vaccine-specifically Pfizer and Moderna-help program the body to help recognize Covid to lessen the chance of getting sick with Covid plus also dramatically lessen the risk of a severe outcome.  These vaccines require 2 shots spaced several weeks apart.  The vaccine utilizes mRNA technology and does not interact with our DNA.  The research for these vaccines started back in the 1990s with mRNA research and technology.  The use of mRNA technology for vaccines was actually developed back during SARS infection of [place] in 2005.  Because this  virus did not go worldwide there was never a need to utilize a vaccine.  The vaccine simply instructs our immune system how to recognize Covid virus thus lessening our risk of severe illness.  The studies show that vaccines are generally very well-tolerated.  It is common to have some mild side effects such as soreness at the site of the injection, for some individuals low-grade fever body aches headaches nausea diarrhea not feeling good for 2 to 3 days after the injection.  These moderate side effects are more likely to occur with the second shot.  Within 2 weeks of completing the second shot antibody levels are at a good level. Study showed that since June 2020 90% of those hospitalized for Covid were unvaccinated.  96% of those who died of Covid were unvaccinated.  The risk of dying from Covid for those who are vaccinated and under age 53 approaches 0%.  Most people who get sick with Covid will gradually get better but there is a randomness to the illness in which even younger individuals can have a  severe case and end up in the hospital with Covid.  It is true that the older we are, Covid poses more risk.  Unfortunately some of the individuals who have severe cases or die from Covid have very little underlying health issues and are not considered "elderly ".1 in 7 individuals who have Covid continue to have some symptoms of long-term Covid.  Sometimes this can be altered smell.  There are some this can result with long-term fatigue tiredness or headaches.  Also it has been shown.  Scientific studies that Covid can trigger heart attacks and strokes.  Having a Covid vaccine will not protect one against all Covid infections.  Approximately 20 to 30% of individuals who have had vaccines may get a amount of breakthrough case.  The way I look upon this-most of [place] drive cars that have seatbelts and in some cases airbags.  Every day we drive, we take a risk of potentially being in the traffic accident.  Having a  seatbelt and airbag does not mean that we will not get in a traffic accident.  But having a seatbelt and the airbag can dramatically lower our risk of a severe outcome that could put [place] in the hospital or cause [place] to die from a auto accident.  In the same way having the Covid vaccine will lessen our risk of getting sick with Covid but if we are unlucky enough to get Covid the chance of being hospitalized or dying from it is tremendously lower.  So just as I would not feel good about disengaging my seatbelt and disabling my airbag-I would not feel good about living in a pandemic and not having the vaccine.  It is not unusual for viruses to mutate and therefore it is not unusual to have to sometimes redesign vaccines or get a booster in order to maintain effectiveness of the vaccine.  I have had several individuals who have stated that they are not around others and so therefore they do not need to get the vaccine.  The difficult part is-this virus can infect some without triggering symptoms-therefore we can get exposed by being around a person who looks healthy-yet they are spreading the virus.  Every person is at risk of getting Covid.  I certainly recognize that many patients have difficulty deciding whether or not to do the vaccine.  Unfortunately there is a lot of information being pushed forward on the news media, social media such as Facebook, and discussion of all friends and family that can be confusing for the average person to figure out.  It is important to realize that this information that we are exposed to falls into 3 categories. Category #1-on target information-this is typically information that is shown by scientific studies, or stated forth by experts within their fields. Category #2-misinformation-this is information that is well intended but is not an accurate reflection of what is really the case. Category #3-dis-information-this is information that is purposefully off base.  It is  designed to confuse and discourage an individual from making a proper decision.  Through the past year and a half I have heard many different reasons why people do not get Covid vaccines.  Some of these reasons are a reflection of a person's deeply held beliefs.  Other reasons are based upon misinformation propagated in social media.  When making this decision it is extremely important to weed through all the noise!   Through my years of training, it has helped me to look at public  health issues and treatment options from a medical perspective.  It is my firm belief that this vaccine is safe (I personally favor Moderna but Pfizer vaccine is also good).  Please do not underestimate this virus.  It is my sincere wish that all of our patients stay healthy and do not suffer tragic outcomes from this virus.  Please consider getting the vaccine if you have not already done so.  If you should have further questions please let us know.  Thanks-Dr. Nicki Reaper

## 2020-11-14 ENCOUNTER — Other Ambulatory Visit: Payer: Self-pay | Admitting: Family Medicine

## 2021-01-08 IMAGING — CT CT ANKLE*R* W/O CM
3 series · 12 of 33 positions shown, 14 images · non-contrast
Comparison: MRI right ankle dated September 19, 2018. Right ankle
x-rays dated September 15, 2018.

CLINICAL DATA: Chronic right ankle pain.

EXAM:
CT OF THE RIGHT ANKLE WITHOUT CONTRAST
TECHNIQUE: Multidetector CT imaging of the right ankle was performed according
to the standard protocol. Multiplanar CT image reconstructions were
also generated.

[Series 5: sfov lower extremity 2.00 br40 s3 soft · axial · 0.23mm/px · z∈[+667,+801]mm · 4 of 99 slices shown, 5 images (1 of 3)]
[im 16/99  soft-tissue]
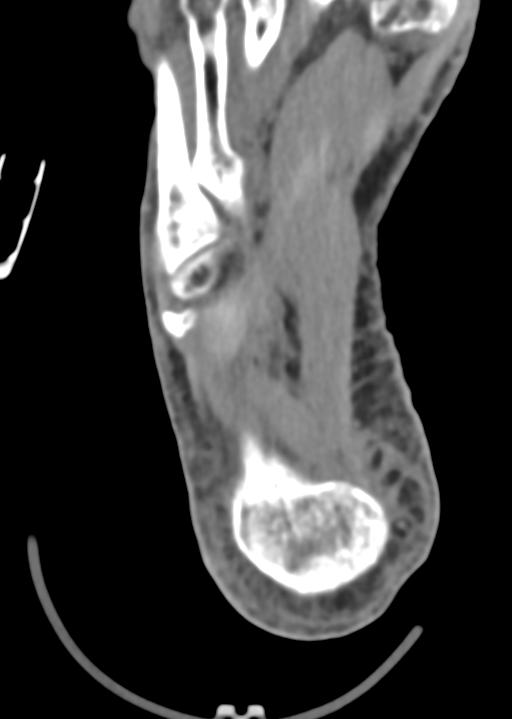
[im 16/99  bone]
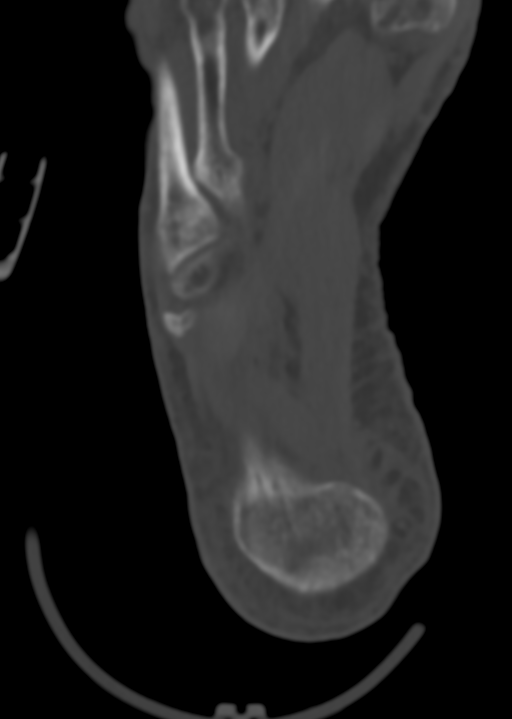
[im 38/99  bone]
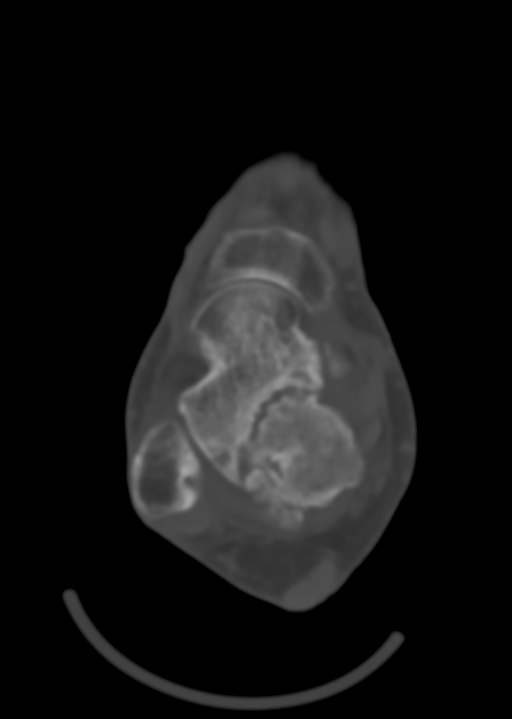
[im 61/99  bone]
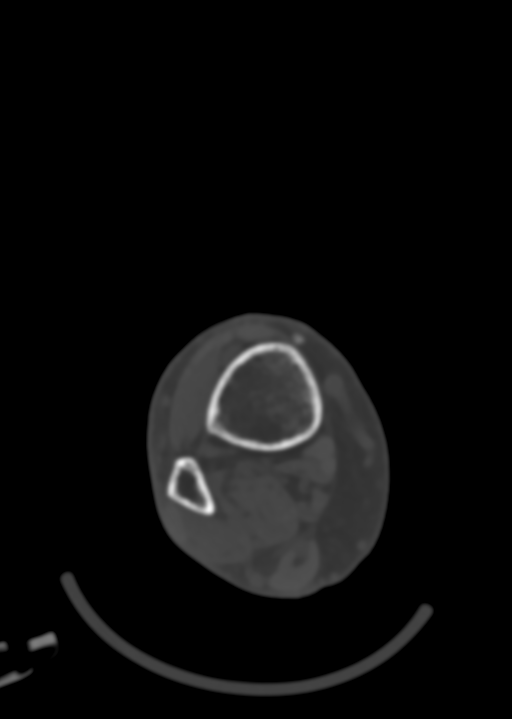
[im 83/99  bone]
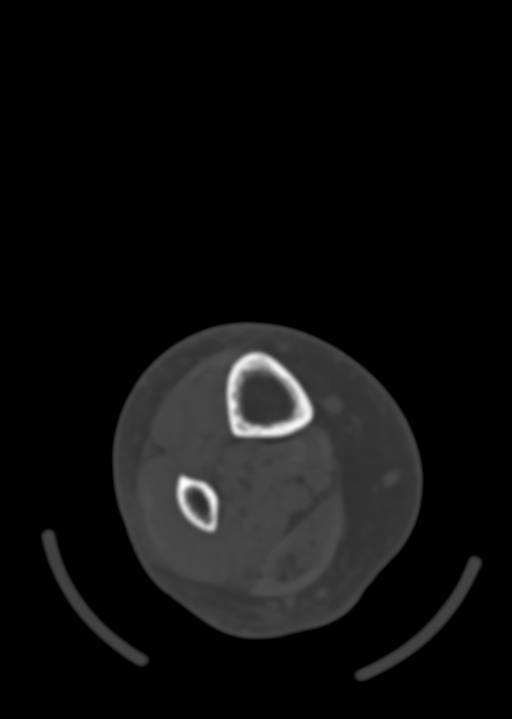

[Series 9: sfov lower extremity 2.00 br40 s3 soft · coronal · 0.23mm/px · 3 of 83 slices shown (2 of 3)]
[im 17/83  bone]
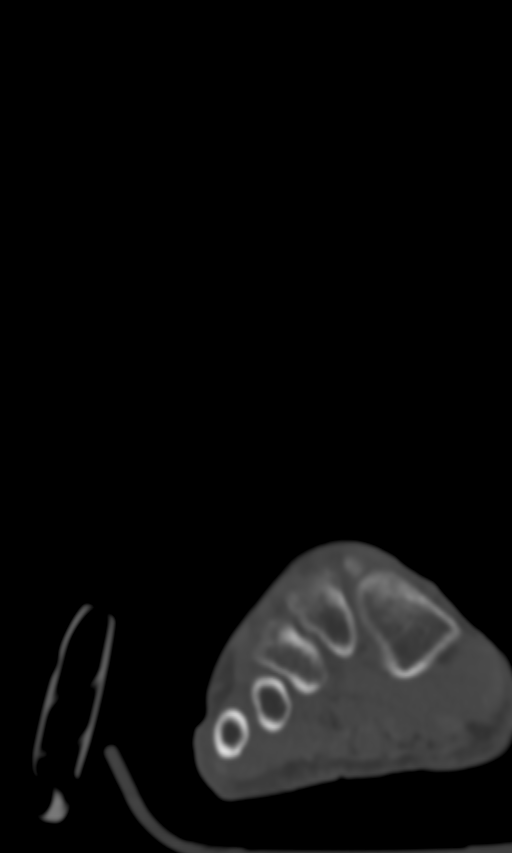
[im 33/83  bone]
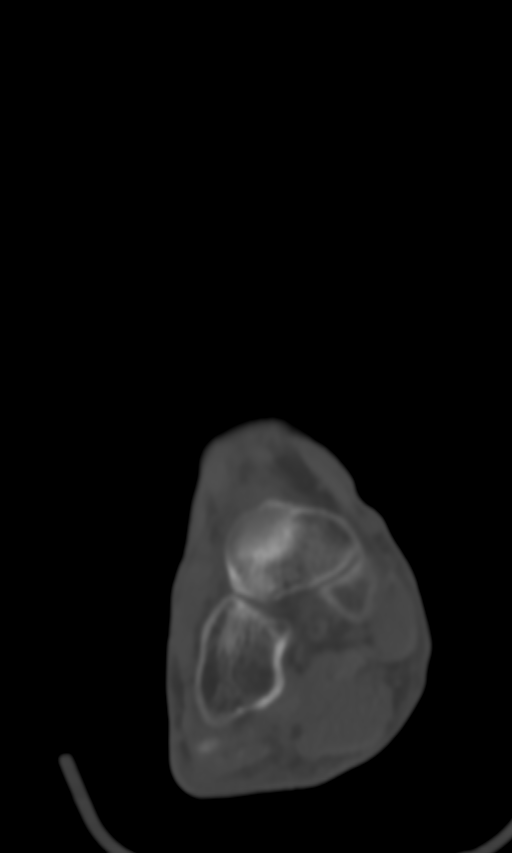
[im 50/83  bone]
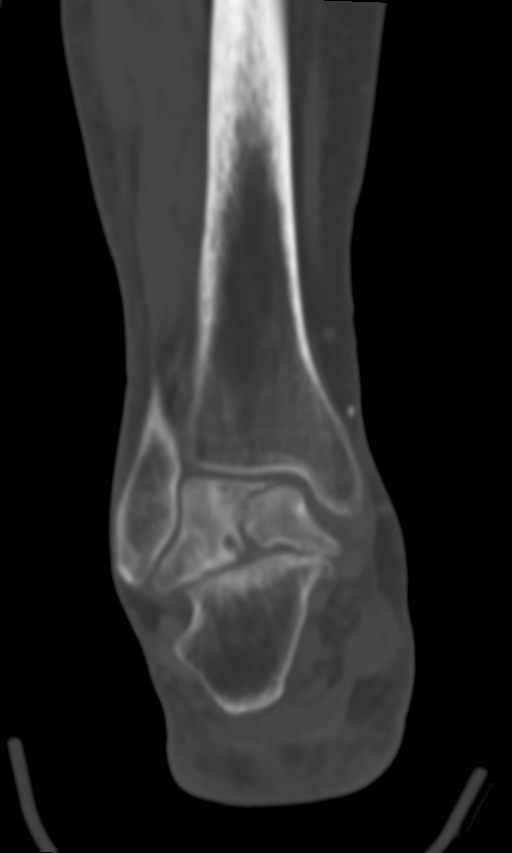

[Series 13: sfov lower extremity 2.00 br40 s3 soft · sagittal · 0.33mm/px · 5 of 59 slices shown, 6 images (3 of 3)]
[im 20/59  bone]
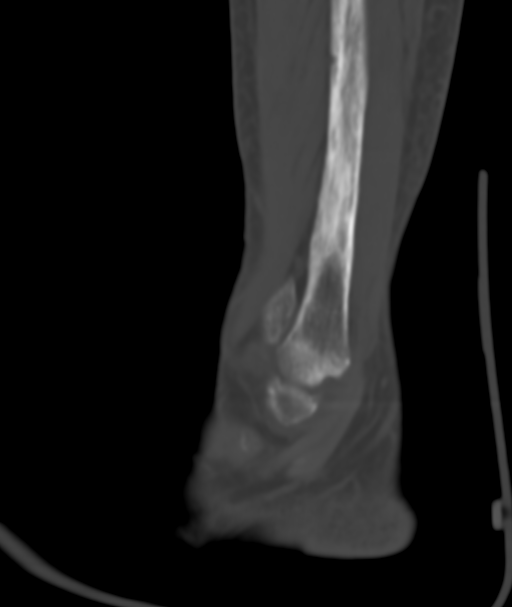
[im 25/59  bone]
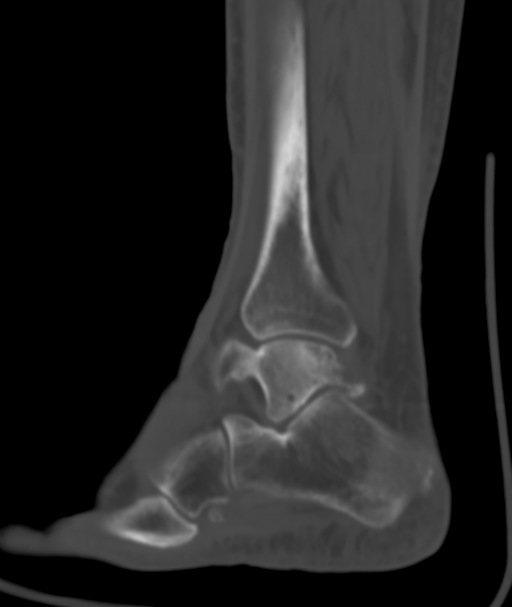
[im 30/59  soft-tissue]
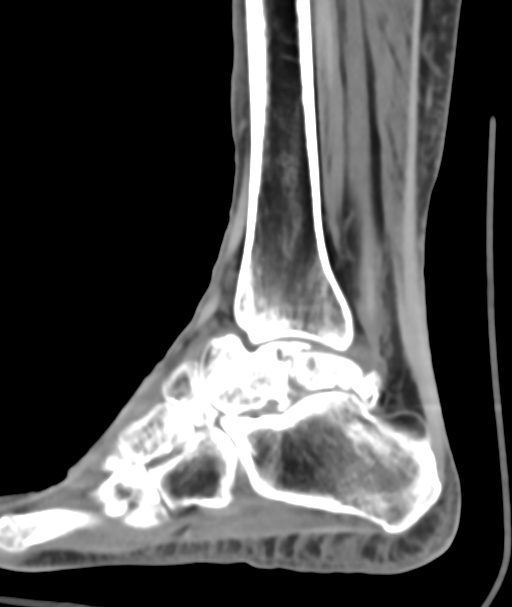
[im 30/59  bone]
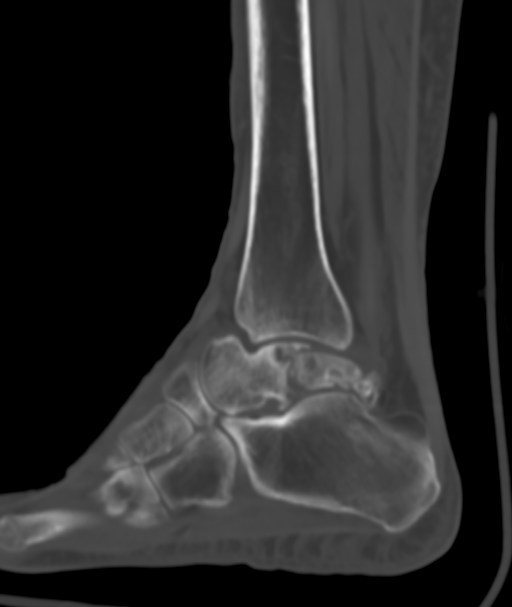
[im 34/59  bone]
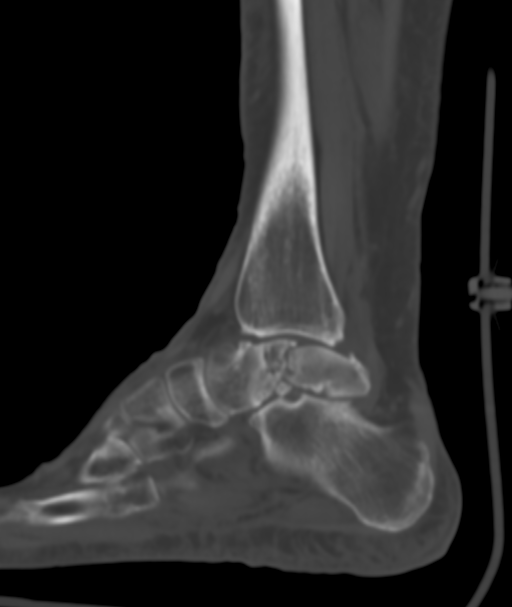
[im 39/59  bone]
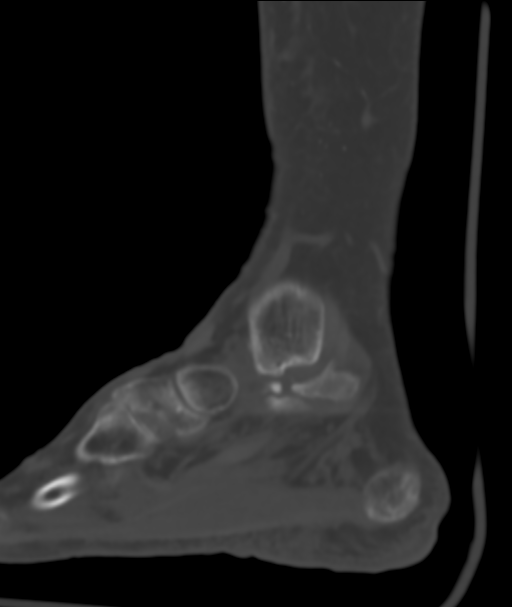

[12 of 33 positions shown; findings below may reference images not displayed]

FINDINGS: Bones/Joint/Cartilage

Chronic nonunited fracture of the talus with partial collapse.
Fragmentation of the posterior talus. No definite avascular
necrosis. Secondary mild to moderate osteoarthritis of the
tibiotalar and subtalar joints with joint space narrowing,
subchondral sclerosis, and subchondral cystic change.

No acute fracture or dislocation. Old healed fractures of the
second, third, and fourth metatarsals. Small tibiotalar joint
effusion.

Ligaments

Ligaments are suboptimally evaluated by CT.

Muscles and Tendons
Grossly intact.

Soft tissue
No fluid collection or hematoma.  No soft tissue mass.
IMPRESSION: 1. Chronic nonunited fracture of the talus with partial collapse,
similar to prior study.
2. Secondary mild to moderate osteoarthritis of the tibiotalar and
subtalar joints.

## 2021-02-28 DIAGNOSIS — Z1231 Encounter for screening mammogram for malignant neoplasm of breast: Secondary | ICD-10-CM | POA: Diagnosis not present

## 2021-02-28 LAB — HM MAMMOGRAPHY: HM Mammogram: NORMAL (ref 0–4)

## 2021-03-24 ENCOUNTER — Telehealth: Payer: Self-pay | Admitting: Family Medicine

## 2021-03-24 DIAGNOSIS — E7849 Other hyperlipidemia: Secondary | ICD-10-CM

## 2021-03-24 DIAGNOSIS — E039 Hypothyroidism, unspecified: Secondary | ICD-10-CM

## 2021-03-24 DIAGNOSIS — R7303 Prediabetes: Secondary | ICD-10-CM

## 2021-03-24 DIAGNOSIS — Z79899 Other long term (current) drug therapy: Secondary | ICD-10-CM

## 2021-03-24 DIAGNOSIS — N289 Disorder of kidney and ureter, unspecified: Secondary | ICD-10-CM

## 2021-03-24 DIAGNOSIS — I10 Essential (primary) hypertension: Secondary | ICD-10-CM

## 2021-03-24 NOTE — Telephone Encounter (Signed)
Last completed orders 09/26/20: BMET, HEPATIC, LIPID, A1C, TSH, CORTISOL. Please advise. Thank you

## 2021-03-24 NOTE — Telephone Encounter (Signed)
Patient requesting lab orders sent for [facility]. She has an appointment with Dr. Nicki Reaper on Wednesday.

## 2021-03-24 NOTE — Telephone Encounter (Signed)
Lab orders placed and pt is aware 

## 2021-03-24 NOTE — Telephone Encounter (Signed)
May go ahead and order these exact labs again

## 2021-03-28 DIAGNOSIS — E7849 Other hyperlipidemia: Secondary | ICD-10-CM | POA: Diagnosis not present

## 2021-03-28 DIAGNOSIS — E039 Hypothyroidism, unspecified: Secondary | ICD-10-CM | POA: Diagnosis not present

## 2021-03-28 DIAGNOSIS — I10 Essential (primary) hypertension: Secondary | ICD-10-CM | POA: Diagnosis not present

## 2021-03-28 DIAGNOSIS — Z79899 Other long term (current) drug therapy: Secondary | ICD-10-CM | POA: Diagnosis not present

## 2021-03-28 DIAGNOSIS — R7303 Prediabetes: Secondary | ICD-10-CM | POA: Diagnosis not present

## 2021-03-29 ENCOUNTER — Other Ambulatory Visit: Payer: Self-pay | Admitting: *Deleted

## 2021-03-29 ENCOUNTER — Ambulatory Visit (INDEPENDENT_AMBULATORY_CARE_PROVIDER_SITE_OTHER): Payer: Medicare Other | Admitting: Family Medicine

## 2021-03-29 ENCOUNTER — Other Ambulatory Visit: Payer: Self-pay

## 2021-03-29 VITALS — BP 128/82 | Temp 97.7°F | Wt 157.8 lb

## 2021-03-29 DIAGNOSIS — I10 Essential (primary) hypertension: Secondary | ICD-10-CM

## 2021-03-29 DIAGNOSIS — M8580 Other specified disorders of bone density and structure, unspecified site: Secondary | ICD-10-CM | POA: Diagnosis not present

## 2021-03-29 DIAGNOSIS — E7849 Other hyperlipidemia: Secondary | ICD-10-CM

## 2021-03-29 DIAGNOSIS — E271 Primary adrenocortical insufficiency: Secondary | ICD-10-CM | POA: Diagnosis not present

## 2021-03-29 DIAGNOSIS — Z1211 Encounter for screening for malignant neoplasm of colon: Secondary | ICD-10-CM

## 2021-03-29 DIAGNOSIS — R7303 Prediabetes: Secondary | ICD-10-CM | POA: Diagnosis not present

## 2021-03-29 LAB — HEPATIC FUNCTION PANEL
ALT: 19 IU/L (ref 0–32)
AST: 29 IU/L (ref 0–40)
Albumin: 4.6 g/dL (ref 3.8–4.8)
Alkaline Phosphatase: 45 IU/L (ref 44–121)
Bilirubin Total: 0.4 mg/dL (ref 0.0–1.2)
Bilirubin, Direct: 0.14 mg/dL (ref 0.00–0.40)
Total Protein: 7 g/dL (ref 6.0–8.5)

## 2021-03-29 LAB — BASIC METABOLIC PANEL
BUN/Creatinine Ratio: 8 — ABNORMAL LOW (ref 12–28)
BUN: 10 mg/dL (ref 8–27)
CO2: 24 mmol/L (ref 20–29)
Calcium: 9.6 mg/dL (ref 8.7–10.3)
Chloride: 106 mmol/L (ref 96–106)
Creatinine, Ser: 1.25 mg/dL — ABNORMAL HIGH (ref 0.57–1.00)
Glucose: 96 mg/dL (ref 65–99)
Potassium: 4 mmol/L (ref 3.5–5.2)
Sodium: 144 mmol/L (ref 134–144)
eGFR: 47 mL/min/{1.73_m2} — ABNORMAL LOW (ref >59)

## 2021-03-29 LAB — LIPID PANEL
Chol/HDL Ratio: 6.3 ratio — ABNORMAL HIGH (ref 0.0–4.4)
Cholesterol, Total: 288 mg/dL — ABNORMAL HIGH (ref 100–199)
HDL: 46 mg/dL (ref >39)
LDL Chol Calc (NIH): 205 mg/dL — ABNORMAL HIGH (ref 0–99)
Triglycerides: 192 mg/dL — ABNORMAL HIGH (ref 0–149)
VLDL Cholesterol Cal: 37 mg/dL (ref 5–40)

## 2021-03-29 LAB — HEMOGLOBIN A1C
Est. average glucose Bld gHb Est-mCnc: 126 mg/dL
Hgb A1c MFr Bld: 6 % — ABNORMAL HIGH (ref 4.8–5.6)

## 2021-03-29 LAB — TSH: TSH: 3.1 u[IU]/mL (ref 0.450–4.500)

## 2021-03-29 LAB — CORTISOL: Cortisol: 13.4 ug/dL

## 2021-03-29 NOTE — Progress Notes (Signed)
Subjective:    Patient ID: Stacy Jensen, female    DOB: 1956/05/12, 65 y.o.   MRN: [id number]  HPI Pt here for follow up on medications. Pt states blood pressure has been doing ok. Is going to have to purchase a new machine. No issues. Taking meds as directed.  Results for orders placed or performed in visit on 09/25/70  Basic Metabolic Panel (BMET)  Result Value Ref Range   Glucose 96 65 - 99 mg/dL   BUN 10 8 - 27 mg/dL   Creatinine, Ser 1.25 (H) 0.57 - 1.00 mg/dL   eGFR 47 (L) >59 mL/min/1.73   BUN/Creatinine Ratio 8 (L) 12 - 28   Sodium 144 134 - 144 mmol/L   Potassium 4.0 3.5 - 5.2 mmol/L   Chloride 106 96 - 106 mmol/L   CO2 24 20 - 29 mmol/L   Calcium 9.6 8.7 - 10.3 mg/dL  Hepatic function panel  Result Value Ref Range   Total Protein 7.0 6.0 - 8.5 g/dL   Albumin 4.6 3.8 - 4.8 g/dL   Bilirubin Total 0.4 0.0 - 1.2 mg/dL   Bilirubin, Direct 0.14 0.00 - 0.40 mg/dL   Alkaline Phosphatase 45 44 - 121 IU/L   AST 29 0 - 40 IU/L   ALT 19 0 - 32 IU/L  Lipid Profile  Result Value Ref Range   Cholesterol, Total 288 (H) 100 - 199 mg/dL   Triglycerides 192 (H) 0 - 149 mg/dL   HDL 46 >39 mg/dL   VLDL Cholesterol Cal 37 5 - 40 mg/dL   LDL Chol Calc (NIH) 205 (H) 0 - 99 mg/dL   Chol/HDL Ratio 6.3 (H) 0.0 - 4.4 ratio  Hemoglobin A1c  Result Value Ref Range   Hgb A1c MFr Bld 6.0 (H) 4.8 - 5.6 %   Est. average glucose Bld gHb Est-mCnc 126 mg/dL  TSH  Result Value Ref Range   TSH 3.100 0.450 - 4.500 uIU/mL  Cortisol  Result Value Ref Range   Cortisol 13.4 ug/dL   Essential hypertension, benign  Other hyperlipidemia  Prediabetes  Adrenal insufficiency (Addison's disease) (HCC)  Osteopenia, unspecified location  Patient states she is trying to eat healthy she does take her medicines as directed she cannot tolerate a higher dose of statins Patient try to keep her weight and check by watching portions and staying active  Review of Systems     Objective:   Physical  Exam General-in no acute distress Eyes-no discharge Lungs-respiratory rate normal, CTA CV-no murmurs,RRR Extremities skin warm dry no edema Neuro grossly normal Behavior normal, alert        Assessment & Plan:  1. Essential hypertension, benign Blood pressure good control dietary measures discussed continue current approach  2. Other hyperlipidemia Cholesterol severely elevated LDL below above 200.  Patient does not tolerate statins.  The only dose she could tolerate was 5 mg twice per week which was not adequate enough to reduce the LDL when she tried statins above twice per week she had severe myalgias Therefore we will try to get Repatha covered If insurance company puts up a blockade we may have to utilize lipid clinic through cardiology  3. Prediabetes A1c stable watch starches in diet watch portions stay active  4. Adrenal insufficiency (Addison's disease) ([place]) Continue current measures.  5. Osteopenia, unspecified location At high risk of fractures osteopenia with increased risk on FRAX score Reclast yearly this is due in the fall  Also referral gastroenterology Dr Laural Golden Will monitor creatinine  every 6 months patient will try to be well-hydrated on next blood draw

## 2021-03-29 NOTE — Patient Instructions (Signed)
Hi Stacy Jensen  It was good to see you today  Please let us know when those pneumonia vaccines were given at [facility]  We will work on notifying gastroenterology regarding your colonoscopy  We will also work on getting the new cholesterol medicine  Please send [place] updates regarding your MRI and orthopedic notes  We will see you in 6 months sooner if any problems TakeCare-Dr. Nicki Reaper

## 2021-03-29 NOTE — Progress Notes (Signed)
Referral put in.

## 2021-03-31 ENCOUNTER — Encounter (INDEPENDENT_AMBULATORY_CARE_PROVIDER_SITE_OTHER): Payer: Self-pay | Admitting: *Deleted

## 2021-03-31 ENCOUNTER — Encounter: Payer: Self-pay | Admitting: Family Medicine

## 2021-05-12 ENCOUNTER — Other Ambulatory Visit: Payer: Self-pay | Admitting: Family Medicine

## 2021-07-10 ENCOUNTER — Encounter (HOSPITAL_COMMUNITY): Payer: BC Managed Care – PPO

## 2021-07-11 ENCOUNTER — Encounter (HOSPITAL_COMMUNITY)
Admission: RE | Admit: 2021-07-11 | Discharge: 2021-07-11 | Disposition: A | Discharge status: Home / Self Care | Payer: Medicare Other | Source: Ambulatory Visit | Attending: Family Medicine | Admitting: Family Medicine

## 2021-07-27 DIAGNOSIS — N390 Urinary tract infection, site not specified: Secondary | ICD-10-CM | POA: Diagnosis not present

## 2021-07-27 DIAGNOSIS — R350 Frequency of micturition: Secondary | ICD-10-CM | POA: Diagnosis not present

## 2021-08-01 LAB — HM HEPATITIS C SCREENING LAB: HM Hepatitis Screen: NEGATIVE

## 2021-08-08 ENCOUNTER — Encounter: Payer: Self-pay | Admitting: Family Medicine

## 2021-08-11 ENCOUNTER — Other Ambulatory Visit: Payer: Self-pay | Admitting: Family Medicine

## 2021-08-21 ENCOUNTER — Telehealth: Payer: Self-pay | Admitting: *Deleted

## 2021-08-21 NOTE — Chronic Care Management (AMB) (Signed)
  Chronic Care Management   Outreach Note  08/21/2021 Name: Stacy Jensen MRN: [id number] DOB: 1956-09-19  Doneshia Hill is a 65 y.o. year old female who is a primary care patient of Luking, Elayne Snare, MD. I reached out to Stacy Jensen by phone today in response to a referral sent by Ms. Hilda Blades F[place]ingham's primary care provider.  An unsuccessful telephone outreach was attempted today. The patient was referred to the case management team for assistance with care management and care coordination.   Follow Up Plan: A HIPAA compliant phone message was left for the patient providing contact information and requesting a return call. The care management team will reach out to the patient again over the next 7 days. If patient returns call to provider office, please advise to call [place] at [phone].  [facility]  Direct Dial: [phone]

## 2021-08-28 NOTE — Chronic Care Management (AMB) (Signed)
  Chronic Care Management   Outreach Note  08/28/2021 Name: Stacy Jensen MRN: [id number] DOB: Dec 18, 1955  Stacy Jensen is a 65 y.o. year old female who is a primary care patient of Luking, Elayne Snare, MD. I reached out to Stacy Jensen by phone today in response to a referral sent by Ms. Stacy Jensen's primary care provider.  A second unsuccessful telephone outreach was attempted today. The patient was referred to the case management team for assistance with care management and care coordination.   Follow Up Plan: A HIPAA compliant phone message was left for the patient providing contact information and requesting a return call. The care management team will reach out to the patient again over the next 7 days. If patient returns call to provider office, please advise to call [place]* at [phone].*  [facility] Management  Direct Dial: [phone]

## 2021-09-05 NOTE — Chronic Care Management (AMB) (Signed)
Chronic Care Management   Note  09/05/2021 Name: Stacy Jensen MRN: [id number] DOB: 02/21/1956  Stacy Jensen is a 65 y.o. year old female who is a primary care patient of Luking, Elayne Snare, MD. I reached out to Nadara Eaton by phone today in response to a referral sent by Ms. Lara Mulch PCP.  [name] was given information about Chronic Care Management services today including:  CCM service includes personalized support from designated clinical staff supervised by her physician, including individualized plan of care and coordination with other care providers 24/7 contact phone numbers for assistance for urgent and routine care needs. Service will only be billed when office clinical staff spend 20 minutes or more in a month to coordinate care. Only one practitioner may furnish and bill the service in a calendar month. The patient may stop CCM services at any time (effective at the end of the month) by phone call to the office staff. The patient is responsible for co-pay (up to 20% after annual deductible is met) if co-pay is required by the individual health plan.   Patient agreed to services and verbal consent obtained.   Follow up plan: Telephone appointment with care management team member scheduled for:10/06/21  [place] Management  Direct Dial: [phone]

## 2021-09-26 ENCOUNTER — Telehealth: Payer: Self-pay | Admitting: *Deleted

## 2021-09-26 DIAGNOSIS — I10 Essential (primary) hypertension: Secondary | ICD-10-CM

## 2021-09-26 DIAGNOSIS — E7849 Other hyperlipidemia: Secondary | ICD-10-CM

## 2021-09-26 DIAGNOSIS — Z79899 Other long term (current) drug therapy: Secondary | ICD-10-CM

## 2021-09-26 DIAGNOSIS — E271 Primary adrenocortical insufficiency: Secondary | ICD-10-CM

## 2021-09-26 DIAGNOSIS — E039 Hypothyroidism, unspecified: Secondary | ICD-10-CM

## 2021-09-26 DIAGNOSIS — R7303 Prediabetes: Secondary | ICD-10-CM

## 2021-09-26 NOTE — Telephone Encounter (Signed)
Lipid, liver, O4C, TSH, metabolic 7, cortisol Adrenal insufficiency, hyperlipidemia, prediabetes, hypothyroidism, hypertension

## 2021-09-26 NOTE — Telephone Encounter (Signed)
Patient calling to see if she needs blood work for upcoming appt 09/29/20  Last labs 03/28/21:  Lipid, Liver, Met 7, HgbA1c, Cortisol, TSH

## 2021-09-27 NOTE — Telephone Encounter (Signed)
Blood work ordered in Epic. Patient notified. 

## 2021-09-28 DIAGNOSIS — Z79899 Other long term (current) drug therapy: Secondary | ICD-10-CM | POA: Diagnosis not present

## 2021-09-28 DIAGNOSIS — E039 Hypothyroidism, unspecified: Secondary | ICD-10-CM | POA: Diagnosis not present

## 2021-09-28 DIAGNOSIS — R7303 Prediabetes: Secondary | ICD-10-CM | POA: Diagnosis not present

## 2021-09-28 DIAGNOSIS — I10 Essential (primary) hypertension: Secondary | ICD-10-CM | POA: Diagnosis not present

## 2021-09-28 DIAGNOSIS — E7849 Other hyperlipidemia: Secondary | ICD-10-CM | POA: Diagnosis not present

## 2021-09-29 ENCOUNTER — Ambulatory Visit (INDEPENDENT_AMBULATORY_CARE_PROVIDER_SITE_OTHER): Payer: Medicare Other | Admitting: Family Medicine

## 2021-09-29 ENCOUNTER — Telehealth: Payer: Self-pay | Admitting: *Deleted

## 2021-09-29 ENCOUNTER — Other Ambulatory Visit: Payer: Self-pay

## 2021-09-29 ENCOUNTER — Encounter: Payer: Self-pay | Admitting: Family Medicine

## 2021-09-29 DIAGNOSIS — R7303 Prediabetes: Secondary | ICD-10-CM

## 2021-09-29 DIAGNOSIS — E039 Hypothyroidism, unspecified: Secondary | ICD-10-CM | POA: Diagnosis not present

## 2021-09-29 DIAGNOSIS — E7849 Other hyperlipidemia: Secondary | ICD-10-CM | POA: Diagnosis not present

## 2021-09-29 DIAGNOSIS — I10 Essential (primary) hypertension: Secondary | ICD-10-CM

## 2021-09-29 DIAGNOSIS — R32 Unspecified urinary incontinence: Secondary | ICD-10-CM

## 2021-09-29 DIAGNOSIS — N289 Disorder of kidney and ureter, unspecified: Secondary | ICD-10-CM

## 2021-09-29 DIAGNOSIS — E230 Hypopituitarism: Secondary | ICD-10-CM | POA: Diagnosis not present

## 2021-09-29 DIAGNOSIS — N3281 Overactive bladder: Secondary | ICD-10-CM

## 2021-09-29 HISTORY — DX: Hypopituitarism: E23.0

## 2021-09-29 LAB — LIPID PANEL
Chol/HDL Ratio: 5.4 ratio — ABNORMAL HIGH (ref 0.0–4.4)
Cholesterol, Total: 271 mg/dL — ABNORMAL HIGH (ref 100–199)
HDL: 50 mg/dL (ref >39)
LDL Chol Calc (NIH): 194 mg/dL — ABNORMAL HIGH (ref 0–99)
Triglycerides: 146 mg/dL (ref 0–149)
VLDL Cholesterol Cal: 27 mg/dL (ref 5–40)

## 2021-09-29 LAB — BASIC METABOLIC PANEL
BUN/Creatinine Ratio: 10 — ABNORMAL LOW (ref 12–28)
BUN: 15 mg/dL (ref 8–27)
CO2: 24 mmol/L (ref 20–29)
Calcium: 9.8 mg/dL (ref 8.7–10.3)
Chloride: 103 mmol/L (ref 96–106)
Creatinine, Ser: 1.44 mg/dL — ABNORMAL HIGH (ref 0.57–1.00)
Glucose: 100 mg/dL — ABNORMAL HIGH (ref 70–99)
Potassium: 4.1 mmol/L (ref 3.5–5.2)
Sodium: 144 mmol/L (ref 134–144)
eGFR: 40 mL/min/{1.73_m2} — ABNORMAL LOW (ref >59)

## 2021-09-29 LAB — HEMOGLOBIN A1C
Est. average glucose Bld gHb Est-mCnc: 117 mg/dL
Hgb A1c MFr Bld: 5.7 % — ABNORMAL HIGH (ref 4.8–5.6)

## 2021-09-29 LAB — TSH: TSH: 2.04 u[IU]/mL (ref 0.450–4.500)

## 2021-09-29 LAB — CORTISOL: Cortisol: 16.4 ug/dL

## 2021-09-29 LAB — HEPATIC FUNCTION PANEL
ALT: 19 IU/L (ref 0–32)
AST: 28 IU/L (ref 0–40)
Albumin: 4.5 g/dL (ref 3.8–4.8)
Alkaline Phosphatase: 48 IU/L (ref 44–121)
Bilirubin Total: 0.5 mg/dL (ref 0.0–1.2)
Bilirubin, Direct: 0.18 mg/dL (ref 0.00–0.40)
Total Protein: 7.1 g/dL (ref 6.0–8.5)

## 2021-09-29 MED ORDER — LEVOTHYROXINE SODIUM 75 MCG PO TABS: ORAL_TABLET | ORAL | 1 refills | Status: DC

## 2021-09-29 MED ORDER — LISINOPRIL 5 MG PO TABS: ORAL_TABLET | ORAL | 1 refills | Status: DC

## 2021-09-29 MED ORDER — FENOFIBRATE 160 MG PO TABS: 160.0000 mg | ORAL_TABLET | Freq: Every day | ORAL | 1 refills | Status: DC

## 2021-09-29 MED ORDER — HYDROCORTISONE 10 MG PO TABS: ORAL_TABLET | ORAL | 1 refills | Status: DC

## 2021-09-29 NOTE — Progress Notes (Signed)
Referral ordered in Epic. 

## 2021-09-29 NOTE — Telephone Encounter (Signed)
Ms. hae, ahlers are scheduled for a virtual visit with your provider today.    Just as we do with appointments in the office, we must obtain your consent to participate.  Your consent will be active for this visit and any virtual visit you may have with one of our providers in the next 365 days.    If you have a MyChart account, I can also send a copy of this consent to you electronically.  All virtual visits are billed to your insurance company just like a traditional visit in the office.  As this is a virtual visit, video technology does not allow for your provider to perform a traditional examination.  This may limit your provider's ability to fully assess your condition.  If your provider identifies any concerns that need to be evaluated in person or the need to arrange testing such as labs, EKG, etc, we will make arrangements to do so.    Although advances in technology are sophisticated, we cannot ensure that it will always work on either your end or our end.  If the connection with a video visit is poor, we may have to switch to a telephone visit.  With either a video or telephone visit, we are not always able to ensure that we have a secure connection.   I need to obtain your verbal consent now.   Are you willing to proceed with your visit today?   Stacy Jensen has provided verbal consent on 09/29/2021 for a virtual visit (video or telephone).   Ned Card, LPN 11/02/209  1:55 AM

## 2021-09-29 NOTE — Progress Notes (Signed)
Subjective:    Patient ID: Stacy Jensen, female    DOB: Apr 16, 1956, 66 y.o.   MRN: [id number]  HPI Patient needing follow up on hypertension and blood work. Patient recently had blood work. Patient taking medications as directed.  Virtual Visit via Telephone Note  I connected with Stacy Jensen on 09/29/21 at  8:20 AM EST by telephone and verified that I am speaking with the correct person using two identifiers.  Location: Patient: home Provider: office   I discussed the limitations, risks, security and privacy concerns of performing an evaluation and management service by telephone and the availability of in person appointments. I also discussed with the patient that there may be a patient responsible charge related to this service. The patient expressed understanding and agreed to proceed.   History of Present Illness:    Observations/Objective:   Assessment and Plan:   Follow Up Instructions:    I discussed the assessment and treatment plan with the patient. The patient was provided an opportunity to ask questions and all were answered. The patient agreed with the plan and demonstrated an understanding of the instructions.   The patient was advised to call back or seek an in-person evaluation if the symptoms worsen or if the condition fails to improve as anticipated.  I provided 25 minutes of non-face-to-face time during this encounter.  Results for orders placed or performed in visit on 09/26/21  Lipid panel  Result Value Ref Range   Cholesterol, Total 271 (H) 100 - 199 mg/dL   Triglycerides 146 0 - 149 mg/dL   HDL 50 >39 mg/dL   VLDL Cholesterol Cal 27 5 - 40 mg/dL   LDL Chol Calc (NIH) 194 (H) 0 - 99 mg/dL   Lipid Comment: Comment    Chol/HDL Ratio 5.4 (H) 0.0 - 4.4 ratio  Hepatic function panel  Result Value Ref Range   Total Protein 7.1 6.0 - 8.5 g/dL   Albumin 4.5 3.8 - 4.8 g/dL   Bilirubin Total 0.5 0.0 - 1.2 mg/dL   Bilirubin, Direct 0.18 0.00 - 0.40  mg/dL   Alkaline Phosphatase 48 44 - 121 IU/L   AST 28 0 - 40 IU/L   ALT 19 0 - 32 IU/L  Hemoglobin A1c  Result Value Ref Range   Hgb A1c MFr Bld 5.7 (H) 4.8 - 5.6 %   Est. average glucose Bld gHb Est-mCnc 117 mg/dL  TSH  Result Value Ref Range   TSH 2.040 0.450 - 4.500 uIU/mL  Basic metabolic panel  Result Value Ref Range   Glucose 100 (H) 70 - 99 mg/dL   BUN 15 8 - 27 mg/dL   Creatinine, Ser 1.44 (H) 0.57 - 1.00 mg/dL   eGFR 40 (L) >59 mL/min/1.73   BUN/Creatinine Ratio 10 (L) 12 - 28   Sodium 144 134 - 144 mmol/L   Potassium 4.1 3.5 - 5.2 mmol/L   Chloride 103 96 - 106 mmol/L   CO2 24 20 - 29 mmol/L   Calcium 9.8 8.7 - 10.3 mg/dL  Cortisol  Result Value Ref Range   Cortisol 16.4 ug/dL     The 10-year ASCVD risk score (Arnett DK, et al., 2019) is: 19.8%   Values used to calculate the score:     Age: 60 years     Sex: Female     Is Non-Hispanic African American: No     Diabetic: Yes     Tobacco smoker: No     Systolic Blood Pressure: 527 mmHg  Is BP treated: Yes     HDL Cholesterol: 50 mg/dL     Total Cholesterol: 271 mg/dL  Review of Systems     Objective:   Physical Exam  Today's visit was via telephone Physical exam was not possible for this visit       Assessment & Plan:  1. Hypopituitarism ([place]) Pt on cortisol Labs good  2. Essential hypertension, benign Bp sometimes nl sometimes elevated per pt She will do BP 3 times a week and send readings to [place] in 3 weeks  Goal 130-135/70-85 range  3. Hypothyroidism, unspecified type Tsh good continue as is  4. Renal insufficiency Last reading up but pt fasted 12 hours 1 year ago nl May be develop CKD She will retest in about 3 weeks with mid day well hydrated specimen and uACR  5. Overactive bladder Oxybut didn't work Referral to urology  6. Other hyperlipidemia Pt doesn't tolerate high dos estatin due to myalgia Pt open to Repatha She is willing to talk with Clin Pharmacist

## 2021-10-01 DIAGNOSIS — N390 Urinary tract infection, site not specified: Secondary | ICD-10-CM | POA: Diagnosis not present

## 2021-10-01 DIAGNOSIS — R350 Frequency of micturition: Secondary | ICD-10-CM | POA: Diagnosis not present

## 2021-10-02 DIAGNOSIS — M25571 Pain in right ankle and joints of right foot: Secondary | ICD-10-CM | POA: Diagnosis not present

## 2021-10-02 DIAGNOSIS — M79671 Pain in right foot: Secondary | ICD-10-CM | POA: Diagnosis not present

## 2021-10-02 NOTE — Addendum Note (Signed)
Addended by: Vicente Males on: 10/02/2021 10:04 AM   Modules accepted: Orders

## 2021-10-02 NOTE — Progress Notes (Signed)
10/02/21-referral to pharmacist placed

## 2021-10-06 ENCOUNTER — Ambulatory Visit (INDEPENDENT_AMBULATORY_CARE_PROVIDER_SITE_OTHER): Payer: Medicare Other | Admitting: Pharmacist

## 2021-10-06 DIAGNOSIS — M8580 Other specified disorders of bone density and structure, unspecified site: Secondary | ICD-10-CM

## 2021-10-06 DIAGNOSIS — T466X5A Adverse effect of antihyperlipidemic and antiarteriosclerotic drugs, initial encounter: Secondary | ICD-10-CM

## 2021-10-06 DIAGNOSIS — M791 Myalgia, unspecified site: Secondary | ICD-10-CM

## 2021-10-06 DIAGNOSIS — I10 Essential (primary) hypertension: Secondary | ICD-10-CM

## 2021-10-06 DIAGNOSIS — E7849 Other hyperlipidemia: Secondary | ICD-10-CM

## 2021-10-06 MED ORDER — PRALUENT 75 MG/ML ~~LOC~~ SOAJ: 75.0000 mg | SUBCUTANEOUS | 11 refills | Status: DC

## 2021-10-06 NOTE — Patient Instructions (Addendum)
Stacy Jensen,  It was great to talk to you today!  Please call me with any questions or concerns.  Visit Information   Following is a copy of your full care plan:  Care Plan : Medication Management  Updates made by Beryle Lathe, [place] since 10/06/2021 12:00 AM     Problem: HTN, HLD, Osteopenia   Priority: High  Onset Date: 10/06/2021     Long-Range Goal: Disease progression Prevention   Start Date: 10/06/2021  Expected End Date: 01/04/2022  This Visit's Progress: On track  Priority: High  Note:   Current Barriers:  Unable to achieve control of hyperlipidemia Suboptimal therapeutic regimen for hyperlipidemia  Pharmacist Clinical Goal(s):  Through collaboration with PharmD and provider, patient will  Achieve control of hyperlipidemia as evidenced by improved LDL Adhere to plan to optimize therapeutic regimen for hyperlipidemia as evidenced by report of adherence to recommended medication management changes   Interventions: 1:1 collaboration with Kathyrn Drown, MD regarding development and update of comprehensive plan of care as evidenced by provider attestation and co-signature Inter-disciplinary care team collaboration (see longitudinal plan of care) Comprehensive medication review performed; medication list updated in electronic medical record  Hypertension - Condition stable. Not addressed this visit.: Blood pressure under good control. Blood pressure is at goal of <140/90 mmHg per 2022 AAFP guidelines. Current medications: lisinopril 5 mg by mouth once daily Intolerances: none Taking medications as directed: yes Side effects thought to be attributed to current medication regimen: no Current exercise: not discussed today Current home blood pressure: not discussed today Continue lisinopril 5 mg by mouth once daily Encourage dietary sodium restriction/DASH diet Recommend home blood pressure monitoring to discuss at next visit  Hyperlipidemia - New  goal.: Uncontrolled. LDL above goal of <100 due to high risk given at least 2 major CV risk factors (advancing age, hypertension, and chronic kidney disease (CKD)) and 10-year risk 10-20% per 2020 AACE/ACE guidelines. Triglycerides at goal of <150 per 2020 AACE/ACE guidelines. Current medications: fenofibrate 160 mg by mouth once daily and fish oil 1 capsule by mouth daily Intolerances:  statins (body aches); patient unable to remember names of statins but per chart review looks like she even tried rosuvastatin twice weekly and was unable to tolerate; ezetimibe tried as well and was discontinued due to ineffectiveness per patient Taking medications as directed: yes Side effects thought to be attributed to current medication regimen: no Encourage dietary reduction of high fat containing foods such as butter, nuts, bacon, egg yolks, etc. Reviewed risks of hyperlipidemia, principles of treatment and consequences of untreated hyperlipidemia Statin intolerance noted. No statin due to serious side effects (ex. Myalgias with at least 2 different statins) Continue fenofibrate 160 mg by mouth once daily and fish oil 1 capsule by mouth daily Add alirocumab (Praluent) 75 mg subcutaneously every 2 weeks Re-check lipid panel 4-12 weeks after consistent use of Praluent Patient reports cost concerns with PCSK9i. Praluent chosen due to insurance formulary. Prior authorization completed today via CoverMyMeds (Key: [id number]). Approved through 04/05/22.  Assisted in application for [place]: Hypercholesterolemia - Medicare Access (Confirmation #: [id number]) today. HealthWell ID# [id number] approved with grant amount $2,500 (enrollment period 09/06/21-09/05/22). Will make sure pharmacy has access and understands to bill Medicare insurance as primary payer and [place] as secondary payer.  Name: Vail Basista Card ID: [id number]  [place]: [id number]  PCN: [id number] Group: [id number]  Processor: [place]:  Hypercholesterolemia - Medicare Access  Osteopenia - New goal.: Appropriately managed Current medications: calcium +  vitamin D once daily and zoledronic acid (Reclast) 5 mg IV every 12 months (last infusion October 2021) Intolerances: none Taking medications as directed: no, patient overdue for Reclast infusion (due October 2022) Side effects thought to be attributed to current medication regimen: no Last DEXA (03/31/20): R femur neck T -2.0 and L forearm radius T -2.0 Falls reported in last year: unknown Continue current medications as above. Patient reports cost concern regarding Reclast as she paid $600 for last infusion. She plans to call her insurance company and get a price estimate. If she agrees to copay then will discuss order with PCP since it appears the order has not yet been released.  Recommend 1,000 mg of elemental calcium per day from diet or supplementation. Supplemental calcium not necessary if appropriate amount obtained through diet. Recommend 800 - 2,000 IU of vitamin D supplementation per day to maintain adequate vitamin D levels Recommend walking, low impact aerobic exercise, or strength training  Patient Goals/Self-Care Activities Patient will:  Take medications as prescribed Collaborate with provider on medication access solutions  Follow Up Plan: Telephone follow up appointment with care management team member scheduled for: 10/20/21      Consent to CCM Services: Stacy Jensen was given information about Chronic Care Management services including:  CCM service includes personalized support from designated clinical staff supervised by her physician, including individualized plan of care and coordination with other care providers 24/7 contact phone numbers for assistance for urgent and routine care needs. Service will only be billed when office clinical staff spend 20 minutes or more in a month to coordinate care. Only one practitioner may furnish and bill the service  in a calendar month. The patient may stop CCM services at any time (effective at the end of the month) by phone call to the office staff. The patient will be responsible for cost sharing (co-pay) of up to 20% of the service fee (after annual deductible is met).  Patient agreed to services and verbal consent obtained.   Plan: Telephone follow up appointment with care management team member scheduled for:  10/20/21  Patient verbalizes understanding of instructions and care plan provided today and agrees to view in [place]. Active MyChart status confirmed with patient.    Please call the care guide team at [phone] if you need to cancel or reschedule your appointment.   Kennon Holter, PharmD, BCACP, CPP Clinical Pharmacist Practitioner [place] [phone]  Praluent (alirocumab)   What is this medicine used for: Used to lower your LDL (bad cholesterol) The device is a single-dose disposable pen. This pen can only be used for 1 single injection, and must be discarded after use The medicine is injected under your skin and can be given by yourself or someone else (caregiver).               For more information and training videos: [email or web address]

## 2021-10-06 NOTE — Chronic Care Management (AMB) (Addendum)
Chronic Care Management Pharmacy Note  10/06/2021 Name:  Stacy Jensen MRN:  [id number] DOB:  [birth year]  Summary: Hyperlipidemia Uncontrolled. LDL above goal of <100 due to high risk given at least 2 major CV risk factors (advancing age, hypertension, and chronic kidney disease (CKD)) and 10-year risk 10-20% per 2020 AACE/ACE guidelines. Triglycerides at goal of <150 per 2020 AACE/ACE guidelines. Current medications: fenofibrate 160 mg by mouth once daily and fish oil 1 capsule by mouth daily Intolerances:  statins (body aches); patient unable to remember names of statins but per chart review looks like she even tried rosuvastatin twice weekly and was unable to tolerate; ezetimibe tried as well and was discontinued due to ineffectiveness per patient Continue fenofibrate 160 mg by mouth once daily and fish oil 1 capsule by mouth daily Add alirocumab (Praluent) 75 mg subcutaneously every 2 weeks Re-check lipid panel 4-12 weeks after consistent use of Praluent Patient reports cost concerns with PCSK9i. Praluent chosen due to insurance formulary. Prior authorization completed today via CoverMyMeds (Key: [id number]). Approved through 04/05/22.  Assisted in application for [place]: Hypercholesterolemia - Medicare Access (Confirmation #: [id number]) today. HealthWell ID# [id number] approved with grant amount $2,500 (enrollment period 09/06/21-09/05/22). Will make sure pharmacy has access and understands to bill Medicare insurance as primary payer and [place] as secondary payer.  Name: Stacy Jensen Card ID: [id number]  [place]: [id number]  PCN: [id number] Group: [id number]  Processor: [place]: Hypercholesterolemia - Medicare Access  Osteopenia Current medications: calcium + vitamin D once daily and zoledronic acid (Reclast) 5 mg IV every 12 months (last infusion October 2021) Overdue for Reclast infusion (due October 2022) Continue current medications as above. Patient reports cost concern  regarding Reclast as she paid $600 for last infusion. She plans to call her insurance company and get a price estimate. If she agrees to copay then will discuss order with PCP since it appears the order has not yet been released.   Subjective: Stacy Jensen is an 66 y.o. year old female who is a primary patient of Luking, Elayne Snare, MD.  The CCM team was consulted for assistance with disease management and care coordination needs.    Engaged with patient by telephone for initial visit in response to provider referral for pharmacy case management and/or care coordination services.   Consent to Services:  The patient was given the following information about Chronic Care Management services today, agreed to services, and gave verbal consent: 1. CCM service includes personalized support from designated clinical staff supervised by the primary care provider, including individualized plan of care and coordination with other care providers 2. 24/7 contact phone numbers for assistance for urgent and routine care needs. 3. Service will only be billed when office clinical staff spend 20 minutes or more in a month to coordinate care. 4. Only one practitioner may furnish and bill the service in a calendar month. 5.The patient may stop CCM services at any time (effective at the end of the month) by phone call to the office staff. 6. The patient will be responsible for cost sharing (co-pay) of up to 20% of the service fee (after annual deductible is met). Patient agreed to services and consent obtained.  Patient Care Team: Kathyrn Drown, MD as PCP - General (Family Medicine) Beryle Lathe, [facility] (Pharmacist)  Objective:  Lab Results  Component Value Date   CREATININE 1.44 (H) 09/28/2021   CREATININE 1.25 (H) 03/28/2021   CREATININE 1.00 09/26/2020    Lab Results  Component Value Date   HGBA1C 5.7 (H) 09/28/2021   Last diabetic Eye exam:  Lab Results  Component Value Date/Time   HMDIABEYEEXA No  Retinopathy 09/25/2015 12:00 AM    Last diabetic Foot exam: No results found for: HMDIABFOOTEX      Component Value Date/Time   CHOL 271 (H) 09/28/2021 0937   TRIG 146 09/28/2021 0937   HDL 50 09/28/2021 0937   CHOLHDL 5.4 (H) 09/28/2021 0937   CHOLHDL 4.6 11/16/2014 1326   VLDL 36 11/16/2014 1326   LDLCALC 194 (H) 09/28/2021 0937    Hepatic Function Latest Ref Rng & Units 09/28/2021 03/28/2021 09/26/2020  Total Protein 6.0 - 8.5 g/dL 7.1 7.0 7.2  Albumin 3.8 - 4.8 g/dL 4.5 4.6 4.3  AST 0 - 40 IU/L 28 29 17   ALT 0 - 32 IU/L 19 19 12   Alk Phosphatase 44 - 121 IU/L 48 45 77  Total Bilirubin 0.0 - 1.2 mg/dL 0.5 0.4 0.4  Bilirubin, Direct 0.00 - 0.40 mg/dL 0.18 0.14 0.11    Lab Results  Component Value Date/Time   TSH 2.040 09/28/2021 09:37 AM   TSH 3.100 03/28/2021 10:12 AM   FREET4 1.15 07/11/2017 10:37 AM   FREET4 1.62 01/09/2017 10:21 AM    CBC Latest Ref Rng & Units 07/08/2020 12/21/2010 12/20/2010  WBC 4.0 - 10.5 K/uL - 9.1 8.4  Hemoglobin 12.0 - 15.0 g/dL 16.7(H) 12.8 13.0  Hematocrit 36.0 - 46.0 % 49.0(H) 38.3 38.2  Platelets 150 - 400 K/uL - 159 164    Lab Results  Component Value Date/Time   VD25OH 42 06/07/2014 10:11 AM   VD25OH 56 08/27/2013 11:06 AM    Clinical ASCVD: No  The 10-year ASCVD risk score (Arnett DK, et al., 2019) is: 19.8%   Values used to calculate the score:     Age: 11 years     Sex: Female     Is Non-Hispanic African American: No     Diabetic: Yes     Tobacco smoker: No     Systolic Blood Pressure: 510 mmHg     Is BP treated: Yes     HDL Cholesterol: 50 mg/dL     Total Cholesterol: 271 mg/dL    Social History   Tobacco Use  Smoking Status Never  Smokeless Tobacco Never   BP Readings from Last 3 Encounters:  03/29/21 128/82  09/28/20 136/86  09/09/20 128/82   Pulse Readings from Last 3 Encounters:  09/28/20 (!) 102  09/09/20 91  07/08/20 85   Wt Readings from Last 3 Encounters:  03/29/21 157 lb 12.8 oz (71.6 kg)  09/28/20  160 lb 12.8 oz (72.9 kg)  09/09/20 160 lb 6.4 oz (72.8 kg)    Assessment: Review of patient past medical history, allergies, medications, health status, including review of consultants reports, laboratory and other test data, was performed as part of comprehensive evaluation and provision of chronic care management services.   SDOH:  (Social Determinants of Health) assessments and interventions performed:  SDOH Interventions    Flowsheet Row Most Recent Value  SDOH Interventions   SDOH Interventions for the Following Domains Financial Strain  Financial Strain Interventions Other (Comment)  [Helped patient apply for [place]  Allergies  Allergen Reactions   Codeine Nausea And Vomiting   Statins     Body aches    Medications Reviewed Today     Reviewed by Beryle Lathe, [place] (Pharmacist) on  10/06/21 at 1054  Med List Status: <None>   Medication Order Taking? Sig Documenting Provider Last Dose Status Informant  albuterol (VENTOLIN HFA) 108 (90 Base) MCG/ACT inhaler [id number] No Inhale 2 puffs into the lungs every 6 (six) hours as needed for wheezing.  Patient not taking: Reported on 10/06/2021   Kathyrn Drown, MD Not Taking Active   Calcium Citrate (CITRACAL PO) [id number] Yes Take 1 tablet by mouth daily. [provider] Taking Active            Med Note Waldo Laine, Gwenyth Allegra   Fri Oct 06, 2021 10:44 AM) Dewaine Conger 1 extra tablet by mouth on Sunday/Wednesday  co-enzyme Q-10 30 MG capsule [id number] Yes Take 30 mg by mouth daily. [provider] Taking Active            Med Note Celesta Aver, [facility] M   Tue Mar 22, 2020  8:54 AM) 200 mg daily  famotidine (PEPCID) 40 MG tablet [id number] Yes Take one tablet by mouth once a day  Patient taking differently: Take 20 mg by mouth daily.   Kathyrn Drown, MD Taking Active   fenofibrate 160 MG tablet [id number] Yes Take 1 tablet (160 mg total) by mouth daily. Kathyrn Drown, MD Taking Active   hydrocortisone (CORTEF) 10 MG tablet [id number] Yes TAKE 2 TABLETS BY MOUTH ONCE DAILY WITH FOOD Kathyrn Drown, MD Taking Active   levothyroxine (SYNTHROID) 75 MCG tablet [id number] Yes TAKE 1 TABLET BY MOUTH ONCE DAILY TAKE  5  DAYS  A  WEEK Luking, Elayne Snare, MD Taking Active            Med Note Waldo Laine, Gwenyth Allegra   Fri Oct 06, 2021 10:43 AM) Does not take on Sunday/Wednesday  lisinopril (ZESTRIL) 5 MG tablet [id number] Yes TAKE 1 TABLET BY MOUTH DAILY Luking, Elayne Snare, MD Taking Active   Multiple Vitamins-Minerals (MULTIVITAMIN WITH MINERALS) tablet [id number] Yes Take 1 tablet by mouth daily. [provider] Taking Active            Med Note Beryle Lathe   Fri Oct 06, 2021 10:47 AM) [place] Pai Womens 50+  Omega-3 Fatty Acids (OMEGA-3 FISH OIL) 1200 MG CAPS [id number] Yes Take 1 capsule by mouth daily. [provider] Taking Active   ondansetron (ZOFRAN ODT) 8 MG disintegrating tablet [id number] No Take 1 tablet (8 mg total) by mouth every 8 (eight) hours as needed for nausea or vomiting.  Patient not taking: Reported on 10/06/2021   Chalmers Guest, NP Not Taking Active   OVER THE COUNTER MEDICATION [id number] Yes magnessium 253m daily  b12 5,000 mcg daily  Colon clenz one daly as needed  Apple cider vinegar. One oz shots 2 -3 times daily [provider] Taking Active   zoledronic acid (RECLAST) 5 MG/100ML SOLN injection [id number] Inject 5 mg into the vein once.  Patient not taking: Reported on 09/29/2021   [provider] Not Taking Active            Med Note (Beryle Lathe  Fri Oct 06, 2021 10:49 AM) Last dose October 2021            Patient Active Problem List   Diagnosis Date Noted   [place]opituitarism ([place] 09/29/2021   Close exposure to COVID-19 virus 09/19/2020   Nausea and vomiting 09/11/2020   Acute cystitis without hematuria 09/09/2020   Dysuria 09/09/2020   GERD (gastroesophageal reflux  disease) 04/11/2020   Osteopenia 11/20/2015  Adrenal insufficiency (Addison's disease) ([place]) 11/13/2015   Pituitary Cushing's syndrome ([place]) 11/13/2015   Prediabetes 10/19/2015   Renal insufficiency 11/22/2014   Obesity 11/22/2014   Benign paroxysmal positional vertigo 06/14/2014   Postmenopausal bleeding 11/12/2013   Hypothyroid 09/04/2013   Overactive bladder 09/04/2013   Hypertriglyceridemia 02/05/2013   Hyperlipidemia 02/05/2013   Essential hypertension, benign 02/05/2013   Cushing syndrome ([place]) 02/05/2013    Immunization History  Administered Date(s) Administered   Influenza-Unspecified 09/24/2006, 06/14/2016, 06/14/2017, 07/07/2018   Pneumococcal Polysaccharide-23 09/04/2013   Td 07/16/2017    Conditions to be addressed/monitored: HTN, HLD, and Osteopenia  Care Plan : Medication Management  Updates made by Beryle Lathe, [place] since 10/06/2021 12:00 AM     Problem: HTN, HLD, Osteopenia   Priority: High  Onset Date: 10/06/2021     Long-Range Goal: Disease progression Prevention   Start Date: 10/06/2021  Expected End Date: 01/04/2022  This Visit's Progress: On track  Priority: High  Note:   Current Barriers:  Unable to achieve control of hyperlipidemia Suboptimal therapeutic regimen for hyperlipidemia  Pharmacist Clinical Goal(s):  Through collaboration with PharmD and provider, patient will  Achieve control of hyperlipidemia as evidenced by improved LDL Adhere to plan to optimize therapeutic regimen for hyperlipidemia as evidenced by report of adherence to recommended medication management changes   Interventions: 1:1 collaboration with Kathyrn Drown, MD regarding development and update of comprehensive plan of care as evidenced by provider attestation and co-signature Inter-disciplinary care team collaboration (see longitudinal plan of care) Comprehensive medication review performed; medication list updated in electronic medical  record  Hypertension - Condition stable. Not addressed this visit.: Blood pressure under good control. Blood pressure is at goal of <140/90 mmHg per 2022 AAFP guidelines. Current medications: lisinopril 5 mg by mouth once daily Intolerances: none Taking medications as directed: yes Side effects thought to be attributed to current medication regimen: no Current exercise: not discussed today Current home blood pressure: not discussed today Continue lisinopril 5 mg by mouth once daily Encourage dietary sodium restriction/DASH diet Recommend home blood pressure monitoring to discuss at next visit  Hyperlipidemia - New goal.: Uncontrolled. LDL above goal of <100 due to high risk given at least 2 major CV risk factors (advancing age, hypertension, and chronic kidney disease (CKD)) and 10-year risk 10-20% per 2020 AACE/ACE guidelines. Triglycerides at goal of <150 per 2020 AACE/ACE guidelines. Current medications: fenofibrate 160 mg by mouth once daily and fish oil 1 capsule by mouth daily Intolerances:  statins (body aches); patient unable to remember names of statins but per chart review looks like she even tried rosuvastatin twice weekly and was unable to tolerate; ezetimibe tried as well and was discontinued due to ineffectiveness per patient Taking medications as directed: yes Side effects thought to be attributed to current medication regimen: no Encourage dietary reduction of high fat containing foods such as butter, nuts, bacon, egg yolks, etc. Reviewed risks of hyperlipidemia, principles of treatment and consequences of untreated hyperlipidemia Statin intolerance noted. No statin due to serious side effects (ex. Myalgias with at least 2 different statins) Continue fenofibrate 160 mg by mouth once daily and fish oil 1 capsule by mouth daily Add alirocumab (Praluent) 75 mg subcutaneously every 2 weeks Re-check lipid panel 4-12 weeks after consistent use of Praluent Patient reports cost  concerns with PCSK9i. Praluent chosen due to insurance formulary. Prior authorization completed today via CoverMyMeds (Key: [id number]). Approved through 04/05/22.  Assisted in application for [place]: Hypercholesterolemia - Medicare Access (Confirmation #:  [id number]) today. HealthWell ID# [id number] approved with grant amount $2,500 (enrollment period 09/06/21-09/05/22). Will make sure pharmacy has access and understands to bill Medicare insurance as primary payer and [place] as secondary payer.  Name: Katheryn Culliton Card ID: [id number]  [place]: [id number]  PCN: [id number] Group: [id number]  Processor: [place]: Hypercholesterolemia - Medicare Access  Osteopenia - New goal.: Appropriately managed Current medications: calcium + vitamin D once daily and zoledronic acid (Reclast) 5 mg IV every 12 months (last infusion October 2021) Intolerances: none Taking medications as directed: no, patient overdue for Reclast infusion (due October 2022) Side effects thought to be attributed to current medication regimen: no Last DEXA (03/31/20): R femur neck T -2.0 and L forearm radius T -2.0 Falls reported in last year: unknown Continue current medications as above. Patient reports cost concern regarding Reclast as she paid $600 for last infusion. She plans to call her insurance company and get a price estimate. If she agrees to copay then will discuss order with PCP since it appears the order has not yet been released.  Recommend 1,000 mg of elemental calcium per day from diet or supplementation. Supplemental calcium not necessary if appropriate amount obtained through diet. Recommend 800 - 2,000 IU of vitamin D supplementation per day to maintain adequate vitamin D levels Recommend walking, low impact aerobic exercise, or strength training  Patient Goals/Self-Care Activities Patient will:  Take medications as prescribed Collaborate with provider on medication access solutions  Follow Up Plan: Telephone  follow up appointment with care management team member scheduled for: 10/20/21      Medication Assistance:  HealthWell Grant Pending  Patient's preferred pharmacy is:  [facility] [address] [place] [address] Phone: [phone] Fax: [phone]  Follow Up:  Patient agrees to Care Plan and Follow-up.  Plan: Telephone follow up appointment with care management team member scheduled for:  10/20/21  Kennon Holter, PharmD, BCACP, CPP Clinical Pharmacist Practitioner [place] [phone]

## 2021-10-06 NOTE — Addendum Note (Signed)
Addended by: Beryle Lathe on: 10/06/2021 02:34 PM   Modules accepted: Orders

## 2021-10-09 ENCOUNTER — Ambulatory Visit: Payer: Medicare Other | Admitting: Urology

## 2021-10-09 ENCOUNTER — Encounter: Payer: Self-pay | Admitting: Urology

## 2021-10-09 ENCOUNTER — Other Ambulatory Visit: Payer: Self-pay

## 2021-10-09 VITALS — BP 151/78 | HR 102 | Wt 159.2 lb

## 2021-10-09 DIAGNOSIS — R8271 Bacteriuria: Secondary | ICD-10-CM

## 2021-10-09 DIAGNOSIS — N289 Disorder of kidney and ureter, unspecified: Secondary | ICD-10-CM | POA: Diagnosis not present

## 2021-10-09 DIAGNOSIS — N3281 Overactive bladder: Secondary | ICD-10-CM

## 2021-10-09 DIAGNOSIS — N3000 Acute cystitis without hematuria: Secondary | ICD-10-CM

## 2021-10-09 LAB — MICROSCOPIC EXAMINATION
RBC, Urine: NONE SEEN /hpf (ref 0–2)
Renal Epithel, UA: NONE SEEN /hpf

## 2021-10-09 LAB — URINALYSIS, ROUTINE W REFLEX MICROSCOPIC
Bilirubin, UA: NEGATIVE
Glucose, UA: NEGATIVE
Ketones, UA: NEGATIVE
Nitrite, UA: NEGATIVE
Protein,UA: NEGATIVE
RBC, UA: NEGATIVE
Specific Gravity, UA: 1.015 (ref 1.005–1.030)
Urobilinogen, Ur: 0.2 mg/dL (ref 0.2–1.0)
pH, UA: 7 (ref 5.0–7.5)

## 2021-10-09 NOTE — Progress Notes (Signed)
Urological Symptom Review  Patient is experiencing the following symptoms: Frequent urination Hard to postpone urination Get up at night to urinate Leakage of urine Stream starts and stops Urinary tract infection   Review of Systems  Gastrointestinal (upper)  : Negative for upper GI symptoms  Gastrointestinal (lower) : Negative for lower GI symptoms  Constitutional : Fatigue  Skin: Negative for skin symptoms  Eyes: Negative for eye symptoms  Ear/Nose/Throat : Negative for Ear/Nose/Throat symptoms  Hematologic/Lymphatic: Easy bruising  Cardiovascular : Negative for cardiovascular symptoms  Respiratory : Negative for respiratory symptoms  Endocrine: Negative for endocrine symptoms  Musculoskeletal: Back pain  Neurological: Negative for neurological symptoms  Psychologic: Negative for psychiatric symptoms

## 2021-10-09 NOTE — Progress Notes (Signed)
10/09/2021 10:35 AM   Stacy Jensen 11/15/1955 [id number]  Referring provider: Kathyrn Drown, MD [place],  [place] [address]  No chief complaint on file.   HPI:  New patient-  1) lower urinary tract symptoms-patient with a history of frequency and urgency.  She has also had incontinence when she stands or gets home with sudden urgency x many years. Wears a pad. Worse after pituitary surgery in 2009, but no other NG risk. She tried oxybutynin but had bothersome dry mouth. She tried Myrbetriq also which didn't help and expensive. No GU surgery or pelvic surgery. No constipation. She takes probiotics. No gross hematuria.   She gets "UTI" with symptoms of urine odor. She drinks a lot of fountain coke and pepsi - two 44 oz per day.   2) chronic kidney disease-her last creatinine was 1.44 with a GFR of 40-47 January 2023.  She does have a history of type 2 diabetes weeks.  Today, she is seen for the above.   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 If you have any questions, ask your nurse or doctor.          albuterol 108 (90 Base) MCG/ACT inhaler Commonly known as: VENTOLIN  HFA Inhale 2 puffs into the lungs every 6 (six) hours as needed for wheezing.   CITRACAL PO Take 1 tablet by mouth daily.   co-enzyme Q-10 30 MG capsule Take 30 mg by mouth daily.   famotidine 40 MG tablet Commonly known as: Pepcid Take one tablet by mouth once a day What changed:  how much to take how to take this when to take this additional instructions   fenofibrate 160 MG tablet Take 1 tablet (160 mg total) by mouth daily.   hydrocortisone 10 MG tablet Commonly known as: CORTEF TAKE 2 TABLETS BY MOUTH ONCE DAILY WITH FOOD   levothyroxine 75 MCG tablet Commonly known as: SYNTHROID TAKE 1 TABLET BY MOUTH ONCE DAILY TAKE  5  DAYS  A  WEEK   lisinopril 5 MG tablet Commonly known as: ZESTRIL TAKE 1 TABLET BY MOUTH DAILY   multivitamin with minerals tablet Take 1 tablet by mouth daily.   Omega-3 Fish Oil 1200 MG Caps Take 1 capsule by mouth daily.   ondansetron 8 MG disintegrating tablet Commonly known as: Zofran ODT Take 1 tablet (8 mg total) by mouth every 8 (eight) hours as needed for nausea or vomiting.   OVER THE COUNTER MEDICATION magnessium 250mg  daily  b12 5,000 mcg daily  Colon clenz one daly as needed  Apple cider vinegar. One oz shots 2 -3 times daily   Praluent 75 MG/ML [place] Generic drug: Alirocumab Inject 75 mg into the skin every 14 (fourteen) days.   zoledronic acid 5 MG/100ML Soln injection Commonly known as: RECLAST Inject 5 mg into the vein once.        Allergies:  Allergies  Allergen Reactions   Codeine Nausea And Vomiting   Statins     Body aches    Family History: Family History  Problem Relation Age of Onset   Hypertension Mother    Hypertension Father    Heart attack Father     Social History:  reports that she has never smoked. She has never used smokeless tobacco. She reports that she does not drink alcohol and does not use drugs.   Physical Exam: BP (!) 151/78    Pulse (!) 102    Wt 159 lb 3.2 oz (72.2 kg)     BMI 31.09 kg/m   Constitutional:  Alert and oriented, No acute distress. HEENT: [place] AT, moist mucus membranes.  Trachea midline, no masses. Cardiovascular: No clubbing, cyanosis, or edema. Respiratory: Normal respiratory effort, no increased work of breathing. GI: Abdomen is soft, nontender, nondistended, no abdominal masses Skin: No rashes, bruises or suspicious lesions. Neurologic: Grossly intact, no focal deficits, moving all 4 extremities. Psychiatric: Normal mood and affect.  Laboratory Data: Lab Results  Component Value Date   WBC 9.1 12/21/2010   HGB 16.7 (H) 07/08/2020   HCT 49.0 (H) 07/08/2020   MCV 89.5 12/21/2010   PLT 159 12/21/2010    Lab Results  Component Value Date   CREATININE 1.44 (H) 09/28/2021     Lab Results  Component Value Date   HGBA1C 5.7 (H) 09/28/2021    Urinalysis    Component Value Date/Time   COLORURINE YELLOW 02/26/2008 1429   APPEARANCEUR CLEAR 02/26/2008 1429   LABSPEC 1.015 02/26/2008 1640   PHURINE 5.5 02/26/2008 1640   GLUCOSEU NEGATIVE 02/26/2008 1640   HGBUR NEGATIVE 02/26/2008 1640   BILIRUBINUR + 09/18/2018 1021   KETONESUR NEGATIVE 02/26/2008 1640   PROTEINUR NEGATIVE 02/26/2008 1640   UROBILINOGEN 0.2 02/26/2008 1640   NITRITE positive 09/09/2020 1601   NITRITE NEGATIVE 02/26/2008 1640   LEUKOCYTESUR Moderate (2+) (A) 09/09/2020 1601    Lab Results  Component Value Date   BACTERIA RARE 02/26/2008      Assessment & Plan:    1. Overactive bladder  We discussed the nature risks and benefits of continued surveillance, physical therapy, anticholinergics, beta 3 agonists and PTNS or Botox.  All questions answered. She would be [facility].  - Urinalysis, Routine w reflex microscopic  2. Bacteriuria - Discussed when to use abx - symptomatic UTI only. Also, stressed importance of less soda and more water.    3. Renal insufficiency Her last creatinine was 1.44 with a GFR of 40-47. Check renal  US/bladder US.  - Urinalysis, Routine w reflex microscopic   No follow-ups on file.  Festus Aloe, MD  [facility]  [address] [place], [place] [address] [phone]

## 2021-10-20 ENCOUNTER — Telehealth: Payer: Medicare Other

## 2021-10-24 DIAGNOSIS — I10 Essential (primary) hypertension: Secondary | ICD-10-CM | POA: Diagnosis not present

## 2021-10-24 DIAGNOSIS — E7849 Other hyperlipidemia: Secondary | ICD-10-CM

## 2021-10-26 ENCOUNTER — Other Ambulatory Visit: Payer: Self-pay

## 2021-10-26 ENCOUNTER — Ambulatory Visit (HOSPITAL_COMMUNITY)
Admission: RE | Admit: 2021-10-26 | Discharge: 2021-10-26 | Disposition: A | Discharge status: Home / Self Care | Payer: Medicare Other | Source: Ambulatory Visit | Attending: Urology | Admitting: Urology

## 2021-10-26 DIAGNOSIS — N289 Disorder of kidney and ureter, unspecified: Secondary | ICD-10-CM | POA: Diagnosis not present

## 2021-10-26 DIAGNOSIS — N281 Cyst of kidney, acquired: Secondary | ICD-10-CM | POA: Diagnosis not present

## 2021-10-26 DIAGNOSIS — R39198 Other difficulties with micturition: Secondary | ICD-10-CM | POA: Insufficient documentation

## 2021-11-06 ENCOUNTER — Ambulatory Visit (INDEPENDENT_AMBULATORY_CARE_PROVIDER_SITE_OTHER): Payer: Medicare Other | Admitting: Pharmacist

## 2021-11-06 DIAGNOSIS — E7849 Other hyperlipidemia: Secondary | ICD-10-CM

## 2021-11-06 DIAGNOSIS — M8580 Other specified disorders of bone density and structure, unspecified site: Secondary | ICD-10-CM

## 2021-11-06 DIAGNOSIS — I10 Essential (primary) hypertension: Secondary | ICD-10-CM

## 2021-11-06 NOTE — Chronic Care Management (AMB) (Signed)
Chronic Care Management Pharmacy Note  11/06/2021 Name:  Stacy Jensen MRN:  [id number] DOB:  1956/04/25  Summary: General: Patient reports urology recommends pelvic floor physical therapy and patient anticipates beginning this soon  Hyperlipidemia Uncontrolled. LDL above goal of <70 due to very high risk given 10-year risk >20% per 2020 AACE/ACE guidelines. Patient has taken 2 injections of Praluent now and due or 3rd dose today. She reports tolerating well so far.  Continue fenofibrate 160 mg by mouth once daily, alirocumab (Praluent) 75 mg subcutaneously every 2 weeks, and fish oil 1 capsule by mouth daily Re-check fasting lipid panel in 1 month. Patient aware.   Osteopenia Overdue for Reclast infusion (due October 2022) Patient reports cost concern regarding Reclast as she paid $600 for last infusion. Patient previously reported she was going to call her insurance company and get a price estimate. She has not been able to complete that yet, but plans to take care of that today. If she agrees to copay then will discuss order with PCP since it appears the order has not yet been released.  Recommend 1,000 mg of elemental calcium per day from diet or supplementation. Supplemental calcium not necessary if appropriate amount obtained through diet. Recommend 800 - 2,000 IU of vitamin D supplementation per day to maintain adequate vitamin D levels Recommend walking, low impact aerobic exercise, or strength training  Subjective: Stacy Jensen is an 66 y.o. year old female who is a primary patient of Luking, Elayne Snare, MD.  The CCM team was consulted for assistance with disease management and care coordination needs.    Engaged with patient by telephone for follow up visit in response to provider referral for pharmacy case management and/or care coordination services.   Consent to Services:  The patient was given information about Chronic Care Management services, agreed to services, and gave  verbal consent prior to initiation of services.  Please see initial visit note for detailed documentation.   Patient Care Team: Kathyrn Drown, MD as PCP - General (Family Medicine) Beryle Lathe, [facility] (Pharmacist)  Objective:  Lab Results  Component Value Date   CREATININE 1.44 (H) 09/28/2021   CREATININE 1.25 (H) 03/28/2021   CREATININE 1.00 09/26/2020    Lab Results  Component Value Date   HGBA1C 5.7 (H) 09/28/2021   Last diabetic Eye exam:  Lab Results  Component Value Date/Time   HMDIABEYEEXA No Retinopathy 09/25/2015 12:00 AM    Last diabetic Foot exam: No results found for: HMDIABFOOTEX      Component Value Date/Time   CHOL 271 (H) 09/28/2021 0937   TRIG 146 09/28/2021 0937   HDL 50 09/28/2021 0937   CHOLHDL 5.4 (H) 09/28/2021 0937   CHOLHDL 4.6 11/16/2014 1326   VLDL 36 11/16/2014 1326   LDLCALC 194 (H) 09/28/2021 0937    Hepatic Function Latest Ref Rng & Units 09/28/2021 03/28/2021 09/26/2020  Total Protein 6.0 - 8.5 g/dL 7.1 7.0 7.2  Albumin 3.8 - 4.8 g/dL 4.5 4.6 4.3  AST 0 - 40 IU/L 28 29 17   ALT 0 - 32 IU/L 19 19 12   Alk Phosphatase 44 - 121 IU/L 48 45 77  Total Bilirubin 0.0 - 1.2 mg/dL 0.5 0.4 0.4  Bilirubin, Direct 0.00 - 0.40 mg/dL 0.18 0.14 0.11    Lab Results  Component Value Date/Time   TSH 2.040 09/28/2021 09:37 AM   TSH 3.100 03/28/2021 10:12 AM   FREET4 1.15 07/11/2017 10:37 AM   FREET4 1.62 01/09/2017 10:21 AM  CBC Latest Ref Rng & Units 07/08/2020 12/21/2010 12/20/2010  WBC 4.0 - 10.5 K/uL - 9.1 8.4  Hemoglobin 12.0 - 15.0 g/dL 16.7(H) 12.8 13.0  Hematocrit 36.0 - 46.0 % 49.0(H) 38.3 38.2  Platelets 150 - 400 K/uL - 159 164    Lab Results  Component Value Date/Time   VD25OH 42 06/07/2014 10:11 AM   VD25OH 56 08/27/2013 11:06 AM    Clinical ASCVD: No  The 10-year ASCVD risk score (Arnett DK, et al., 2019) is: 28.7%   Values used to calculate the score:     Age: 79 years     Sex: Female     Is Non-Hispanic African  American: No     Diabetic: Yes     Tobacco smoker: No     Systolic Blood Pressure: 408 mmHg     Is BP treated: Yes     HDL Cholesterol: 50 mg/dL     Total Cholesterol: 271 mg/dL    Social History   Tobacco Use  Smoking Status Never  Smokeless Tobacco Never   BP Readings from Last 3 Encounters:  10/09/21 (!) 151/78  03/29/21 128/82  09/28/20 136/86   Pulse Readings from Last 3 Encounters:  10/09/21 (!) 102  09/28/20 (!) 102  09/09/20 91   Wt Readings from Last 3 Encounters:  10/09/21 159 lb 3.2 oz (72.2 kg)  03/29/21 157 lb 12.8 oz (71.6 kg)  09/28/20 160 lb 12.8 oz (72.9 kg)    Assessment: Review of patient past medical history, allergies, medications, health status, including review of consultants reports, laboratory and other test data, was performed as part of comprehensive evaluation and provision of chronic care management services.   SDOH:  (Social Determinants of Health) assessments and interventions performed:  SDOH Interventions    Flowsheet Row Most Recent Value  SDOH Interventions   Financial Strain Interventions Other (Comment)  [patient approved for grant funding for hypercholesterolemia]       CCM Care Plan  Allergies  Allergen Reactions   Codeine Nausea And Vomiting   Statins     Body aches    Medications Reviewed Today     Reviewed by Beryle Lathe, [facility] (Pharmacist) on 11/06/21 at [place] List Status: <None>   Medication Order Taking? Sig Documenting Provider Last Dose Status Informant  albuterol (VENTOLIN HFA) 108 (90 Base) MCG/ACT inhaler [id number] Yes Inhale 2 puffs into the lungs every 6 (six) hours as needed for wheezing. Kathyrn Drown, MD Taking Active   Alirocumab (PRALUENT) 75 MG/ML Darden Palmer [id number] Yes Inject 75 mg into the skin every 14 (fourteen) days. Kathyrn Drown, MD Taking Active   Calcium Citrate (CITRACAL PO) [id number] Yes Take 1 tablet by mouth daily. [provider] Taking Active            Med Note  Waldo Laine, Gwenyth Allegra   Fri Oct 06, 2021 10:44 AM) Dewaine Conger 1 extra tablet by mouth on Sunday/Wednesday  co-enzyme Q-10 30 MG capsule [id number] Yes Take 30 mg by mouth daily. [provider] Taking Active            Med Note Celesta Aver, [facility]   Tue Mar 22, 2020  8:54 AM) 200 mg daily  famotidine (PEPCID) 40 MG tablet [id number] Yes Take one tablet by mouth once a day  Patient taking differently: Take 20 mg by mouth daily.   Kathyrn Drown, MD Taking Active   fenofibrate 160 MG tablet [id number] Yes Take 1 tablet (160 mg total) by  mouth daily. Kathyrn Drown, MD Taking Active   hydrocortisone (CORTEF) 10 MG tablet [id number] Yes TAKE 2 TABLETS BY MOUTH ONCE DAILY WITH FOOD Kathyrn Drown, MD Taking Active   levothyroxine (SYNTHROID) 75 MCG tablet [id number] Yes TAKE 1 TABLET BY MOUTH ONCE DAILY TAKE  5  DAYS  A  WEEK Luking, Elayne Snare, MD Taking Active            Med Note Waldo Laine, Gwenyth Allegra   Fri Oct 06, 2021 10:43 AM) Does not take on Sunday/Wednesday  lisinopril (ZESTRIL) 5 MG tablet [id number] Yes TAKE 1 TABLET BY MOUTH DAILY Luking, Elayne Snare, MD Taking Active   Multiple Vitamins-Minerals (MULTIVITAMIN WITH MINERALS) tablet [id number] Yes Take 1 tablet by mouth daily. [provider] Taking Active            Med Note Beryle Lathe   Fri Oct 06, 2021 10:47 AM) [place] Pai Womens 50+  Omega-3 Fatty Acids (OMEGA-3 FISH OIL) 1200 MG CAPS [id number] Yes Take 1 capsule by mouth daily. [provider] Taking Active   OVER THE COUNTER MEDICATION [id number] Yes magnessium 227m daily  b12 5,000 mcg daily  Colon clenz one daly as needed  Apple cider vinegar. One oz shots 2 -3 times daily [provider] Taking Active   zoledronic acid (RECLAST) 5 MG/100ML SOLN injection [id number] Inject 5 mg into the vein once.  Patient not taking: Reported on 11/06/2021   [provider] Not Taking Active            Med Note (Beryle Lathe  Fri Oct 06, 2021 10:49 AM) Last dose October 2021            Patient Active Problem List   Diagnosis Date Noted   [place]ituitarism ([place] 09/29/2021   Close exposure to COVID-19 virus 09/19/2020   Nausea and vomiting 09/11/2020   Acute cystitis without hematuria 09/09/2020   Dysuria 09/09/2020   GERD (gastroesophageal reflux disease) 04/11/2020   Osteopenia 11/20/2015   Adrenal insufficiency ([place]'s disease) ([place] 11/13/2015   Pituitary Cushing's syndrome ([place] 11/13/2015   Prediabetes 10/19/2015   Renal insufficiency 11/22/2014   Obesity 11/22/2014   Benign paroxysmal positional vertigo 06/14/2014   Postmenopausal bleeding 11/12/2013   Hypothyroid 09/04/2013   Overactive bladder 09/04/2013   Hypertriglyceridemia 02/05/2013   Hyperlipidemia 02/05/2013   Essential hypertension, benign 02/05/2013   Cushing syndrome ([place] 02/05/2013    Immunization History  Administered Date(s) Administered   Influenza-Unspecified 09/24/2006, 06/14/2016, 06/14/2017, 07/07/2018   Pneumococcal Polysaccharide-23 09/04/2013   Td 07/16/2017    Conditions to be addressed/monitored: HTN, HLD, and Osteopenia  Care Plan : Medication Management  Updates made by WBeryle Lathe RGraysonsince 11/06/2021 12:00 AM     Problem: HTN, HLD, Osteopenia   Priority: High  Onset Date: 10/06/2021     Long-Range Goal: Disease progression Prevention   Start Date: 10/06/2021  Expected End Date: 01/04/2022  Recent Progress: On track  Priority: High  Note:   Current Barriers:  Unable to achieve control of hyperlipidemia  Pharmacist Clinical Goal(s):  Through collaboration with PharmD and provider, patient will  Achieve control of hyperlipidemia as evidenced by improved LDL   Interventions: 1:1 collaboration with LKathyrn Drown MD regarding development and update of comprehensive plan of care as evidenced by provider attestation and co-signature Inter-disciplinary care team collaboration (see  longitudinal plan of care) Comprehensive medication review performed; medication list updated in electronic medical record  Hypertension - Condition stable.  Not addressed this visit.: Blood pressure under good control. Blood pressure is at goal of <140/90 mmHg per 2022 AAFP guidelines. Current medications: lisinopril 5 mg by mouth once daily Intolerances: none Taking medications as directed: yes Side effects thought to be attributed to current medication regimen: no Current exercise: not discussed today Current home blood pressure: not discussed today Continue lisinopril 5 mg by mouth once daily Encourage dietary sodium restriction/DASH diet Recommend home blood pressure monitoring to discuss at next visit  Hyperlipidemia - Goal on Track (progressing): YES.: Uncontrolled. LDL above goal of <70 due to very high risk given 10-year risk >20% per 2020 AACE/ACE guidelines. Triglycerides at goal of <150 per 2020 AACE/ACE guidelines. Current medications: fenofibrate 160 mg by mouth once daily, alirocumab (Praluent) 75 mg subcutaneously every 2 weeks, and fish oil 1 capsule by mouth daily Intolerances:  statins (body aches); patient unable to remember names of statins but per chart review looks like she even tried rosuvastatin twice weekly and was unable to tolerate; ezetimibe tried as well and was discontinued due to ineffectiveness per patient Taking medications as directed: yes, has taken 2 injections of Praluent now and due or 3rd dose today. She reports tolerating well so far.  Side effects thought to be attributed to current medication regimen: no Encourage dietary reduction of high fat containing foods such as butter, nuts, bacon, egg yolks, etc. Statin intolerance noted. No statin due to serious side effects (ex. Myalgias with at least 2 different statins) Continue fenofibrate 160 mg by mouth once daily, alirocumab (Praluent) 75 mg subcutaneously every 2 weeks, and fish oil 1 capsule by mouth  daily Re-check fasting lipid panel in 1 month. Patient aware.  Cost/Insurance Praluent prior authorization approved through 04/05/22 [place]: Hypercholesterolemia - Medicare Access (Confirmation #: [id number])  [place] ID#: [id number]  Grant amount: [id number]  Enrollment period: 09/06/21-09/05/22 Name: Jeanine Caven Card ID: [id number]  BIN: [id number]  PCN: [id number] Group: [id number]  Processor: PDMI  Osteopenia - Goal on Track (progressing): YES.: Appropriately managed Current medications: calcium + vitamin D once daily and zoledronic acid (Reclast) 5 mg IV every 12 months (last infusion October 2021) Intolerances: none Taking medications as directed: no, patient overdue for Reclast infusion (due October 2022) Side effects thought to be attributed to current medication regimen: no Last DEXA (03/31/20): R femur neck T -2.0 and L forearm radius T -2.0 Falls reported in last year: unknown Continue current medications as above. Patient reports cost concern regarding Reclast as she paid $600 for last infusion. Patient previously reported she was going to call her insurance company and get a price estimate. She has not been able to complete that yet, but plans to take care of that today. If she agrees to copay then will discuss order with PCP since it appears the order has not yet been released.  Recommend 1,000 mg of elemental calcium per day from diet or supplementation. Supplemental calcium not necessary if appropriate amount obtained through diet. Recommend 800 - 2,000 IU of vitamin D supplementation per day to maintain adequate vitamin D levels Recommend walking, low impact aerobic exercise, or strength training  Patient Goals/Self-Care Activities Patient will:  Take medications as prescribed Collaborate with provider on medication access solutions  Follow Up Plan: Telephone follow up appointment with care management team member scheduled for: 12/11/21      Medication Assistance:  see  care plan  Patient's preferred pharmacy is:  [facility] [address], [place]  [place] [address] Phone: [phone] Fax: [phone]  Follow Up:  Patient agrees to Care Plan and Follow-up.  Plan: Telephone follow up appointment with care management team member scheduled for:  12/11/21  Kennon Holter, PharmD, BCACP, CPP Clinical Pharmacist Practitioner [place] [phone]

## 2021-11-06 NOTE — Patient Instructions (Addendum)
Stacy Jensen,  It was great to talk to you today!  Please come get fasting lipid panel [facility] at [facility] in 1 month. We will discuss results at our next follow-up appointment.  Please call me with any questions or concerns.  Visit Information  Following are the goals we discussed today:   Goals Addressed             This Visit's Progress    Medication Management       Patient Goals/Self-Care Activities Patient will:  Take medications as prescribed Collaborate with provider on medication access solutions           Follow-up plan: Telephone follow up appointment with care management team member scheduled for:  12/11/21  Patient verbalizes understanding of instructions and care plan provided today and agrees to view in [place]. Active MyChart status confirmed with patient.    Please call the care guide team at [phone] if you need to cancel or reschedule your appointment.   Kennon Holter, PharmD, [place], CPP Clinical Pharmacist Practitioner [place] [phone]

## 2021-11-07 ENCOUNTER — Encounter (INDEPENDENT_AMBULATORY_CARE_PROVIDER_SITE_OTHER): Payer: Self-pay | Admitting: *Deleted

## 2021-11-07 ENCOUNTER — Telehealth (INDEPENDENT_AMBULATORY_CARE_PROVIDER_SITE_OTHER): Payer: Self-pay | Admitting: *Deleted

## 2021-11-07 NOTE — Telephone Encounter (Signed)
Left several messages for patient to call to schedule tcs (10/26/21 at 216 and 10/31/21 at 129 pm)  [facility] Room 1  Referring MD/PCP: scott luking  Procedure: tcs  Reason/Indication:  screening  Has patient had this procedure before?  no  If so, when, by whom and where?    Is there a family history of colon cancer?  no  Who?  What age when diagnosed?    Is patient diabetic? If yes, Type 1 or Type 2   no      Does patient have prosthetic heart valve or mechanical valve?  no  Do you have a pacemaker/defibrillator?  no  Has patient ever had endocarditis/atrial fibrillation? no  Does patient use oxygen? no  Has patient had joint replacement within last 12 months?  no  Is patient constipated or do they take laxatives? no  Does patient have a history of alcohol/drug use?  no  Have you had a stroke/heart attack last 6 mths? no  Do you take medicine for weight loss?  no  For female patients,: have you had a hysterectomy                       are you post menopausal                       do you still have your menstrual cycle no  Is patient on blood thinner such as Coumadin, Plavix and/or Aspirin? no  Medications: albuterol 2 puffs, praluent inject 75 mg every 14 days, calcium daily, coenzyme 30 mg daily, famotidine 40 mg daily, fenofibrate 160 mg daily, hydrocortisone 10 mg bid, levothyroxine 75 mcg 1 tab 5 days a week, lisinopril 5 mg daily, MVI daily, omega 3 1200 mg daily, ondansetron 8 mg 1 tab every 8 hrs, magnesium 250 mg daily, coq 10 daily, vit b12 daily, colon clenz, cetrizine daily  Allergies: codeine  Pharmacy: [facility] [place]  Medication Adjustment per Dr Rehman/Dr Jenetta Downer   Procedure date & time:

## 2021-11-21 DIAGNOSIS — E7849 Other hyperlipidemia: Secondary | ICD-10-CM | POA: Diagnosis not present

## 2021-11-21 DIAGNOSIS — I10 Essential (primary) hypertension: Secondary | ICD-10-CM

## 2021-11-22 DIAGNOSIS — E7849 Other hyperlipidemia: Secondary | ICD-10-CM | POA: Diagnosis not present

## 2021-11-23 LAB — LIPID PANEL
Chol/HDL Ratio: 3.1 ratio (ref 0.0–4.4)
Cholesterol, Total: 151 mg/dL (ref 100–199)
HDL: 49 mg/dL (ref >39)
LDL Chol Calc (NIH): 77 mg/dL (ref 0–99)
Triglycerides: 141 mg/dL (ref 0–149)
VLDL Cholesterol Cal: 25 mg/dL (ref 5–40)

## 2021-11-28 ENCOUNTER — Encounter (INDEPENDENT_AMBULATORY_CARE_PROVIDER_SITE_OTHER): Payer: Self-pay | Admitting: *Deleted

## 2021-11-28 ENCOUNTER — Telehealth (INDEPENDENT_AMBULATORY_CARE_PROVIDER_SITE_OTHER): Payer: Self-pay | Admitting: *Deleted

## 2021-11-28 ENCOUNTER — Other Ambulatory Visit: Payer: Self-pay

## 2021-11-28 ENCOUNTER — Ambulatory Visit (INDEPENDENT_AMBULATORY_CARE_PROVIDER_SITE_OTHER): Payer: Medicare HMO

## 2021-11-28 VITALS — BP 152/84 | HR 97 | Ht 60.0 in | Wt 159.6 lb

## 2021-11-28 DIAGNOSIS — N3946 Mixed incontinence: Secondary | ICD-10-CM | POA: Diagnosis not present

## 2021-11-28 DIAGNOSIS — Z0000 Encounter for general adult medical examination without abnormal findings: Secondary | ICD-10-CM

## 2021-11-28 DIAGNOSIS — M6281 Muscle weakness (generalized): Secondary | ICD-10-CM | POA: Diagnosis not present

## 2021-11-28 DIAGNOSIS — R7303 Prediabetes: Secondary | ICD-10-CM | POA: Diagnosis not present

## 2021-11-28 DIAGNOSIS — N289 Disorder of kidney and ureter, unspecified: Secondary | ICD-10-CM | POA: Diagnosis not present

## 2021-11-28 DIAGNOSIS — R350 Frequency of micturition: Secondary | ICD-10-CM | POA: Diagnosis not present

## 2021-11-28 DIAGNOSIS — M62838 Other muscle spasm: Secondary | ICD-10-CM | POA: Diagnosis not present

## 2021-11-28 DIAGNOSIS — M6289 Other specified disorders of muscle: Secondary | ICD-10-CM | POA: Diagnosis not present

## 2021-11-28 MED ORDER — PEG 3350-KCL-NA BICARB-NACL 420 G PO SOLR: 4000.0000 mL | Freq: Once | ORAL | 0 refills | Status: AC

## 2021-11-28 NOTE — Progress Notes (Cosign Needed)
Subjective:   Stacy Jensen is a 66 y.o. female who presents for an Initial Medicare Annual Wellness Visit.  Review of Systems     Cardiac Risk Factors include: advanced age (>90mn, >>65women);hypertension;sedentary lifestyle;obesity (BMI >30kg/m2);Other (see comment);dyslipidemia, Risk factor comments: Ankle injury, Cushings Syndrome, Addisons Dz     Objective:    Today's Vitals   11/28/21 0851  BP: (!) 152/84  Pulse: 97  SpO2: 100%  Weight: 159 lb 9.6 oz (72.4 kg)  Height: 5' (1.524 m)   Body mass index is 31.17 kg/m.  Advanced Directives 11/28/2021  Does Patient Have a Medical Advance Directive? No  Would patient like information on creating a medical advance directive? No - Patient declined    Current Medications (verified) Outpatient Encounter Medications as of 11/28/2021  Medication Sig   albuterol (VENTOLIN HFA) 108 (90 Base) MCG/ACT inhaler Inhale 2 puffs into the lungs every 6 (six) hours as needed for wheezing.   Alirocumab (PRALUENT) 75 MG/ML SOAJ Inject 75 mg into the skin every 14 (fourteen) days.   Calcium Citrate (CITRACAL PO) Take 1 tablet by mouth daily.   co-enzyme Q-10 30 MG capsule Take 30 mg by mouth daily.   famotidine (PEPCID) 40 MG tablet Take one tablet by mouth once a day (Patient taking differently: Take 20 mg by mouth daily.)   hydrocortisone (CORTEF) 10 MG tablet TAKE 2 TABLETS BY MOUTH ONCE DAILY WITH FOOD   levothyroxine (SYNTHROID) 75 MCG tablet TAKE 1 TABLET BY MOUTH ONCE DAILY TAKE  5  DAYS  A  WEEK   lisinopril (ZESTRIL) 5 MG tablet TAKE 1 TABLET BY MOUTH DAILY   Multiple Vitamins-Minerals (MULTIVITAMIN WITH MINERALS) tablet Take 1 tablet by mouth daily.   Omega-3 Fatty Acids (OMEGA-3 FISH OIL) 1200 MG CAPS Take 1 capsule by mouth daily.   OVER THE COUNTER MEDICATION magnessium '250mg'$  daily  b12 5,000 mcg daily  Colon clenz one daly as needed  Apple cider vinegar. One oz shots 2 -3 times daily   zoledronic acid (RECLAST) 5 MG/100ML SOLN  injection Inject 5 mg into the vein once.   fenofibrate 160 MG tablet Take 1 tablet (160 mg total) by mouth daily. (Patient not taking: Reported on 11/28/2021)   No facility-administered encounter medications on file as of 11/28/2021.    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    Osteoporosis    [place]   Sleep apnea 2007   Type 2 diabetes mellitus ([facility] 2008   Past Surgical History:  Procedure Laterality Date   PITUITARY EXCISION     TUBAL LIGATION     Family History  Problem Relation Age of Onset   Hypertension Mother    Hypertension Father    Heart attack Father    Social History   Socioeconomic History   Marital status: Married    Spouse name: GDominica Jensen  Number of children: 4   Years of education: Not on file   Highest education level: Not on file  Occupational History   Not on file  Tobacco Use   Smoking status: Never   Smokeless tobacco: Never  Vaping Use   Vaping  Use: Never used  Substance and Sexual Activity   Alcohol use: Never   Drug use: Never   Sexual activity: Yes  Other Topics Concern   Not on file  Social History Narrative   Married x 45 years 6/23.   17 grandchildren   73 great grandchildren   Social Determinants of Radio broadcast assistant Strain: Not on file  Food Insecurity: No Food Insecurity   Worried About Charity fundraiser in the Last Year: Never true   Arboriculturist in the Last Year: Never true  Transportation Needs: No Transportation Needs   Lack of Transportation (Medical): No   Lack of Transportation (Non-Medical): No  Physical Activity: Inactive   Days of Exercise per Week: 0 days    Minutes of Exercise per Session: 0 min  Stress: No Stress Concern Present   Feeling of Stress : Not at all  Social Connections: Socially Integrated   Frequency of Communication with Friends and Family: More than three times a week   Frequency of Social Gatherings with Friends and Family: More than three times a week   Attends Religious Services: More than 4 times per year   Active Member of [employer] or Organizations: Yes   Attends Music therapist: More than 4 times per year   Marital Status: Married    Tobacco Counseling Counseling given: Not Answered   Clinical Intake:  Pre-visit preparation completed: Yes  Pain : No/denies pain     BMI - recorded: 31.84 Nutritional Status: BMI > 30  Obese Nutritional Risks: None Diabetes: No  How often do you need to have someone help you when you read instructions, pamphlets, or other written materials from your doctor or pharmacy?: 1 - Never  Diabetic?No  Interpreter Needed?: No  Information entered by :: Stacy Giuliano Preece, lpn   Activities of Daily Living In your present state of health, do you have any difficulty performing the following activities: 11/28/2021 11/27/2021  Hearing? Y Y  Comment Hearing aids -  Vision? N Y  Difficulty concentrating or making decisions? Y Y  Comment Memory -  Walking or climbing stairs? N N  Dressing or bathing? N N  Doing errands, shopping? N N  Preparing Food and eating ? N N  Using the Toilet? N N  In the past six months, have you accidently leaked urine? Y Y  Comment Sees Urologist -  Do you have problems with loss of bowel control? N Y  Managing your Medications? N N  Managing your Finances? N N  Housekeeping or managing your Housekeeping? N N  Some recent data might be hidden    Patient Care Team: Stacy Drown, MD as PCP - General (Family Medicine) Beryle Lathe, [facility] (Pharmacist)  Indicate any recent Medical Services you may have received from other than [facility]  providers in the past year (date may be approximate).     Assessment:   This is a routine wellness examination for Stacy Jensen.  Hearing/Vision screen Hearing Screening - Comments:: Some hearing issues since Cushings dx.  Vision Screening - Comments:: Contacts. [facility] [place] 2022  Dietary issues and exercise activities discussed: Current Exercise Habits: The patient does not participate in regular exercise at present, Exercise limited by: cardiac condition(s);orthopedic condition(s)   Goals Addressed             This Visit's Progress    Exercise 3x per week (30 min per time)       Try chair  exercises.        Depression Screen PHQ 2/9 Scores 11/28/2021 03/29/2021 09/28/2020 09/09/2020 04/11/2020 02/04/2018  PHQ - 2 Score 0 1 0 0 0 0    Fall Risk Fall Risk  11/28/2021 11/27/2021 03/29/2021 09/28/2020 09/09/2020  Falls in the past year? '1 1 1 '$ 0 0  Number falls in past yr: 0 0 0 0 -  Injury with Fall? 0 1 1 0 -  Risk for fall due to : Impaired balance/gait;Impaired mobility - Impaired balance/gait - Impaired balance/gait  Follow up Falls prevention discussed - Falls evaluation completed;Education provided Falls evaluation completed Falls evaluation completed;Education provided    FALL RISK PREVENTION PERTAINING TO THE HOME:  Any stairs in or around the home? Yes  If so, are there any without handrails? No  Home free of loose throw rugs in walkways, pet beds, electrical cords, etc? Yes  Adequate lighting in your home to reduce risk of falls? Yes   ASSISTIVE DEVICES UTILIZED TO PREVENT FALLS:  Life alert? No  Use of a cane, walker or w/c? No  Grab bars in the bathroom? No  Shower chair or bench in shower? No  Elevated toilet seat or a handicapped toilet? Yes   TIMED UP AND GO:  Was the test performed? Yes .  Length of time to ambulate 10 feet: 10 sec.   Gait steady and fast without use of assistive device  Cognitive Function:     6CIT Screen 11/28/2021  What Year? 0  points  What month? 0 points  What time? 0 points  Count back from 20 0 points  Months in reverse 0 points  Repeat phrase 0 points  Total Score 0    Immunizations Immunization History  Administered Date(s) Administered   Fluad Quad(high Dose 65+) 07/13/2019   Influenza, Quadrivalent, Recombinant, Inj, Pf 07/07/2018   Influenza-Unspecified 09/24/2006, 06/14/2016, 06/14/2017, 07/07/2018   Pneumococcal Polysaccharide-23 09/04/2013   Td 07/16/2017    TDAP status: Up to date  Flu Vaccine status: Due, Education has been provided regarding the importance of this vaccine. Advised may receive this vaccine at local pharmacy or Health Dept. Aware to provide a copy of the vaccination record if obtained from local pharmacy or Health Dept. Verbalized acceptance and understanding.  Pneumococcal vaccine status: Due, Education has been provided regarding the importance of this vaccine. Advised may receive this vaccine at local pharmacy or Health Dept. Aware to provide a copy of the vaccination record if obtained from local pharmacy or Health Dept. Verbalized acceptance and understanding.  Covid-19 vaccine status: Declined, Education has been provided regarding the importance of this vaccine but patient still declined. Advised may receive this vaccine at local pharmacy or Health Dept.or vaccine clinic. Aware to provide a copy of the vaccination record if obtained from local pharmacy or Health Dept. Verbalized acceptance and understanding.  Qualifies for Shingles Vaccine? Yes   Zostavax completed No   Shingrix Completed?: No.    Education has been provided regarding the importance of this vaccine. Patient has been advised to call insurance company to determine out of pocket expense if they have not yet received this vaccine. Advised may also receive vaccine at local pharmacy or Health Dept. Verbalized acceptance and understanding.  Screening Tests Health Maintenance  Topic Date Due   Zoster Vaccines-  Shingrix (1 of 2) Never done   Pneumonia Vaccine 66+ Years old (2 - PCV) 09/04/2014   COLONOSCOPY (Pts 45-83yr Insurance coverage will need to be confirmed)  12/05/2022 (Originally 10/29/1998)  MAMMOGRAM  02/28/2022   OPHTHALMOLOGY EXAM  03/08/2022   HEMOGLOBIN A1C  03/28/2022   TETANUS/TDAP  07/17/2027   DEXA SCAN  Completed   Hepatitis C Screening  Completed   HPV VACCINES  Aged Out   INFLUENZA VACCINE  Discontinued   FOOT EXAM  Discontinued   COVID-19 Vaccine  Discontinued    Health Maintenance  Health Maintenance Due  Topic Date Due   Zoster Vaccines- Shingrix (1 of 2) Never done   Pneumonia Vaccine 76+ Years old (2 - PCV) 09/04/2014    Colorectal cancer screening: Referral to GI placed Pt states she is scheduling. Pt aware the office will call re: appt.  Mammogram status: Completed 02/28/2021. Repeat every year  Bone Density status: Completed 03/31/2020. Results reflect: Bone density results: OSTEOPENIA. Repeat every 2 years.  Lung Cancer Screening: (Low Dose CT Chest recommended if Age 88-80 years, 30 pack-year currently smoking OR have quit w/in 15years.) does not qualify.   Additional Screening:  Hepatitis C Screening: does qualify; Completed 08/01/2021  Vision Screening: Recommended annual ophthalmology exams for early detection of glaucoma and other disorders of the eye. Is the patient up to date with their annual eye exam?  Yes  Who is the provider or what is the name of the office in which the patient attends annual eye exams? [facility] [place] If pt is not established with a provider, would they like to be referred to a provider to establish care? No .   Dental Screening: Recommended annual dental exams for proper oral hygiene  Community Resource Referral / Chronic Care Management: CRR required this visit?  No   CCM required this visit?  No      Plan:     I have personally reviewed and noted the following in the patients chart:   Medical and  social history Use of alcohol, tobacco or illicit drugs  Current medications and supplements including opioid prescriptions. Patient is not currently taking opioid prescriptions. Functional ability and status Nutritional status Physical activity Advanced directives List of other physicians Hospitalizations, surgeries, and ER visits in previous 12 months Vitals Screenings to include cognitive, depression, and falls Referrals and appointments  In addition, I have reviewed and discussed with patient certain preventive protocols, quality metrics, and best practice recommendations. A written personalized care plan for preventive services as well as general preventive health recommendations were provided to patient.     Chriss Driver, LPN   03/24/2457   Nurse Notes: Pt states she is in the process of scheduling her Colonoscopy. Pt declines repeat DEXA at this time. Discussed second Prevnar and Shingrix vaccines and how to obtain.

## 2021-11-28 NOTE — Telephone Encounter (Signed)
Patient needs trilyte 

## 2021-11-28 NOTE — Patient Instructions (Signed)
Stacy Jensen , Thank you for taking time to come for your Medicare Wellness Visit. I appreciate your ongoing commitment to your health goals. Please review the following plan we discussed and let me know if I can assist you in the future.   Screening recommendations/referrals: Colonoscopy: In process of scheduling.  Mammogram: Done 02/28/2021 Repeat annually  Bone Density: Done 03/31/2020 Repeat every 2 years  Recommended yearly ophthalmology/optometry visit for glaucoma screening and checkup Recommended yearly dental visit for hygiene and checkup  Vaccinations: Influenza vaccine: Due Repeat annually  Pneumococcal vaccine: Done 09/04/2013. Second dose due at your convenience.  Tdap vaccine: Done 07/16/2017 Repeat in 10 years  Shingles vaccine: Discussed.   Covid-19:Declined  Advanced directives: Please bring a copy of your health care power of attorney and living will to the office to be added to your chart at your convenience.   Conditions/risks identified: Aim for 30 minutes of exercise or brisk walking, 6-8 glasses of water, and 5 servings of fruits and vegetables each day.   Next appointment: Follow up in one year for your annual wellness visit 2024.   Preventive Care 36 Years and Older, Female Preventive care refers to lifestyle choices and visits with your health care provider that can promote health and wellness. What does preventive care include? A yearly physical exam. This is also called an annual well check. Dental exams once or twice a year. Routine eye exams. Ask your health care provider how often you should have your eyes checked. Personal lifestyle choices, including: Daily care of your teeth and gums. Regular physical activity. Eating a healthy diet. Avoiding tobacco and drug use. Limiting alcohol use. Practicing safe sex. Taking low-dose aspirin every day. Taking vitamin and mineral supplements as recommended by your health care provider. What happens during  an annual well check? The services and screenings done by your health care provider during your annual well check will depend on your age, overall health, lifestyle risk factors, and family history of disease. Counseling  Your health care provider may ask you questions about your: Alcohol use. Tobacco use. Drug use. Emotional well-being. Home and relationship well-being. Sexual activity. Eating habits. History of falls. Memory and ability to understand (cognition). Work and work Statistician. Reproductive health. Screening  You may have the following tests or measurements: Height, weight, and BMI. Blood pressure. Lipid and cholesterol levels. These may be checked every 5 years, or more frequently if you are over 88 years old. Skin check. Lung cancer screening. You may have this screening every year starting at age 37 if you have a 30-pack-year history of smoking and currently smoke or have quit within the past 15 years. Fecal occult blood test (FOBT) of the stool. You may have this test every year starting at age 88. Flexible sigmoidoscopy or colonoscopy. You may have a sigmoidoscopy every 5 years or a colonoscopy every 10 years starting at age 84. Hepatitis C blood test. Hepatitis B blood test. Sexually transmitted disease (STD) testing. Diabetes screening. This is done by checking your blood sugar (glucose) after you have not eaten for a while (fasting). You may have this done every 1-3 years. Bone density scan. This is done to screen for osteoporosis. You may have this done starting at age 29. Mammogram. This may be done every 1-2 years. Talk to your health care provider about how often you should have regular mammograms. Talk with your health care provider about your test results, treatment options, and if necessary, the need for more tests. Vaccines  Your health care provider may recommend certain vaccines, such as: Influenza vaccine. This is recommended every year. Tetanus,  diphtheria, and acellular pertussis (Tdap, Td) vaccine. You may need a Td booster every 10 years. Zoster vaccine. You may need this after age 66. Pneumococcal 13-valent conjugate (PCV13) vaccine. One dose is recommended after age 35. Pneumococcal polysaccharide (PPSV23) vaccine. One dose is recommended after age 56. Talk to your health care provider about which screenings and vaccines you need and how often you need them. This information is not intended to replace advice given to you by your health care provider. Make sure you discuss any questions you have with your health care provider. Document Released: 10/07/2015 Document Revised: 05/30/2016 Document Reviewed: 07/12/2015 Elsevier Interactive Patient Education  2017 [place] Prevention in the Home Falls can cause injuries. They can happen to people of all ages. There are many things you can do to make your home safe and to help prevent falls. What can I do on the outside of my home? Regularly fix the edges of walkways and driveways and fix any cracks. Remove anything that might make you trip as you walk through a door, such as a raised step or threshold. Trim any bushes or trees on the path to your home. Use bright outdoor lighting. Clear any walking paths of anything that might make someone trip, such as rocks or tools. Regularly check to see if handrails are loose or broken. Make sure that both sides of any steps have handrails. Any raised decks and porches should have guardrails on the edges. Have any leaves, snow, or ice cleared regularly. Use sand or salt on walking paths during winter. Clean up any spills in your garage right away. This includes oil or grease spills. What can I do in the bathroom? Use night lights. Install grab bars by the toilet and in the tub and shower. Do not use towel bars as grab bars. Use non-skid mats or decals in the tub or shower. If you need to sit down in the shower, use a plastic,  non-slip stool. Keep the floor dry. Clean up any water that spills on the floor as soon as it happens. Remove soap buildup in the tub or shower regularly. Attach bath mats securely with double-sided non-slip rug tape. Do not have throw rugs and other things on the floor that can make you trip. What can I do in the bedroom? Use night lights. Make sure that you have a light by your bed that is easy to reach. Do not use any sheets or blankets that are too big for your bed. They should not hang down onto the floor. Have a firm chair that has side arms. You can use this for support while you get dressed. Do not have throw rugs and other things on the floor that can make you trip. What can I do in the kitchen? Clean up any spills right away. Avoid walking on wet floors. Keep items that you use a lot in easy-to-reach places. If you need to reach something above you, use a strong step stool that has a grab bar. Keep electrical cords out of the way. Do not use floor polish or wax that makes floors slippery. If you must use wax, use non-skid floor wax. Do not have throw rugs and other things on the floor that can make you trip. What can I do with my stairs? Do not leave any items on the stairs. Make sure that there are  handrails on both sides of the stairs and use them. Fix handrails that are broken or loose. Make sure that handrails are as long as the stairways. Check any carpeting to make sure that it is firmly attached to the stairs. Fix any carpet that is loose or worn. Avoid having throw rugs at the top or bottom of the stairs. If you do have throw rugs, attach them to the floor with carpet tape. Make sure that you have a light switch at the top of the stairs and the bottom of the stairs. If you do not have them, ask someone to add them for you. What else can I do to help prevent falls? Wear shoes that: Do not have high heels. Have rubber bottoms. Are comfortable and fit you well. Are closed  at the toe. Do not wear sandals. If you use a stepladder: Make sure that it is fully opened. Do not climb a closed stepladder. Make sure that both sides of the stepladder are locked into place. Ask someone to hold it for you, if possible. Clearly mark and make sure that you can see: Any grab bars or handrails. First and last steps. Where the edge of each step is. Use tools that help you move around (mobility aids) if they are needed. These include: Canes. Walkers. Scooters. Crutches. Turn on the lights when you go into a dark area. Replace any light bulbs as soon as they burn out. Set up your furniture so you have a clear path. Avoid moving your furniture around. If any of your floors are uneven, fix them. If there are any pets around you, be aware of where they are. Review your medicines with your doctor. Some medicines can make you feel dizzy. This can increase your chance of falling. Ask your doctor what other things that you can do to help prevent falls. This information is not intended to replace advice given to you by your health care provider. Make sure you discuss any questions you have with your health care provider. Document Released: 07/07/2009 Document Revised: 02/16/2016 Document Reviewed: 10/15/2014 Elsevier Interactive Patient Education  2017 [employer].

## 2021-11-29 LAB — BASIC METABOLIC PANEL
BUN/Creatinine Ratio: 10 — ABNORMAL LOW (ref 12–28)
BUN: 13 mg/dL (ref 8–27)
CO2: 25 mmol/L (ref 20–29)
Calcium: 9.9 mg/dL (ref 8.7–10.3)
Chloride: 105 mmol/L (ref 96–106)
Creatinine, Ser: 1.31 mg/dL — ABNORMAL HIGH (ref 0.57–1.00)
Glucose: 94 mg/dL (ref 70–99)
Potassium: 4.2 mmol/L (ref 3.5–5.2)
Sodium: 144 mmol/L (ref 134–144)
eGFR: 44 mL/min/{1.73_m2} — ABNORMAL LOW (ref >59)

## 2021-11-29 LAB — MICROALBUMIN / CREATININE URINE RATIO
Creatinine, Urine: 57.3 mg/dL
Microalb/Creat Ratio: 18 mg/g creat (ref 0–29)
Microalbumin, Urine: 10.2 ug/mL

## 2021-12-04 ENCOUNTER — Other Ambulatory Visit (INDEPENDENT_AMBULATORY_CARE_PROVIDER_SITE_OTHER): Payer: Self-pay

## 2021-12-04 DIAGNOSIS — Z1211 Encounter for screening for malignant neoplasm of colon: Secondary | ICD-10-CM

## 2021-12-05 ENCOUNTER — Ambulatory Visit (INDEPENDENT_AMBULATORY_CARE_PROVIDER_SITE_OTHER): Payer: Medicare HMO | Admitting: Family Medicine

## 2021-12-05 ENCOUNTER — Encounter: Payer: Self-pay | Admitting: Family Medicine

## 2021-12-05 ENCOUNTER — Other Ambulatory Visit: Payer: Self-pay

## 2021-12-05 VITALS — BP 144/94 | HR 65 | Temp 97.1°F | Wt 158.0 lb

## 2021-12-05 DIAGNOSIS — M791 Myalgia, unspecified site: Secondary | ICD-10-CM

## 2021-12-05 DIAGNOSIS — T466X5A Adverse effect of antihyperlipidemic and antiarteriosclerotic drugs, initial encounter: Secondary | ICD-10-CM

## 2021-12-05 DIAGNOSIS — N289 Disorder of kidney and ureter, unspecified: Secondary | ICD-10-CM | POA: Diagnosis not present

## 2021-12-05 DIAGNOSIS — E7849 Other hyperlipidemia: Secondary | ICD-10-CM | POA: Diagnosis not present

## 2021-12-05 DIAGNOSIS — I10 Essential (primary) hypertension: Secondary | ICD-10-CM

## 2021-12-05 MED ORDER — LISINOPRIL 10 MG PO TABS: ORAL_TABLET | ORAL | 1 refills | Status: DC

## 2021-12-05 NOTE — Patient Instructions (Addendum)
[email or web address] ? ? ?DASH Eating Plan ?DASH stands for Dietary Approaches to Stop Hypertension. The DASH eating plan is a healthy eating plan that has been shown to: ?Reduce high blood pressure (hypertension). ?Reduce your risk for type 2 diabetes, heart disease, and stroke. ?Help with weight loss. ?What are tips for following this plan? ?Reading food labels ?Check food labels for the amount of salt (sodium) per serving. Choose foods with less than 5 percent of the Daily Value of sodium. Generally, foods with less than 300 milligrams (mg) of sodium per serving fit into this eating plan. ?To find whole grains, look for the word "whole" as the first word in the ingredient list. ?Shopping ?Buy products labeled as "low-sodium" or "no salt added." ?Buy fresh foods. Avoid canned foods and pre-made or frozen meals. ?Cooking ?Avoid adding salt when cooking. Use salt-free seasonings or herbs instead of table salt or sea salt. Check with your health care provider or pharmacist before using salt substitutes. ?Do not fry foods. Cook foods using healthy methods such as baking, boiling, grilling, roasting, and broiling instead. ?Cook with heart-healthy oils, such as olive, canola, avocado, soybean, or sunflower oil. ?Meal planning ? ?Eat a balanced diet that includes: ?4 or more servings of fruits and 4 or more servings of vegetables each day. Try to fill one-half of your plate with fruits and vegetables. ?6-8 servings of whole grains each day. ?Less than 6 oz (170 g) of lean meat, poultry, or fish each day. A 3-oz (85-g) serving of meat is about the same size as a deck of cards. One egg equals 1 oz (28 g). ?2-3 servings of low-fat dairy each day. One serving is 1 cup (237 mL). ?1 serving of nuts, seeds, or beans 5 times each week. ?2-3 servings of heart-healthy fats. Healthy fats called omega-3 fatty acids are found in foods such as walnuts, flaxseeds, fortified milks, and eggs. These fats are also found in cold-water fish, such  as sardines, salmon, and mackerel. ?Limit how much you eat of: ?Canned or prepackaged foods. ?Food that is high in trans fat, such as some fried foods. ?Food that is high in saturated fat, such as fatty meat. ?Desserts and other sweets, sugary drinks, and other foods with added sugar. ?Full-fat dairy products. ?Do not salt foods before eating. ?Do not eat more than 4 egg yolks a week. ?Try to eat at least 2 vegetarian meals a week. ?Eat more home-cooked food and less restaurant, buffet, and fast food. ?Lifestyle ?When eating at a restaurant, ask that your food be prepared with less salt or no salt, if possible. ?If you drink alcohol: ?Limit how much you use to: ?0-1 drink a day for women who are not pregnant. ?0-2 drinks a day for men. ?Be aware of how much alcohol is in your drink. In the [place] one drink equals one 12 oz bottle of beer (355 mL), one 5 oz glass of wine (148 mL), or one 1? oz glass of hard liquor (44 mL). ?General information ?Avoid eating more than 2,300 mg of salt a day. If you have hypertension, you may need to reduce your sodium intake to 1,500 mg a day. ?Work with your health care provider to maintain a healthy body weight or to lose weight. Ask what an ideal weight is for you. ?Get at least 30 minutes of exercise that causes your heart to beat faster (aerobic exercise) most days of the week. Activities may include walking, swimming, or biking. ?Work with your health care provider  or dietitian to adjust your eating plan to your individual calorie needs. ?What foods should I eat? ?Fruits ?All fresh, dried, or frozen fruit. Canned fruit in natural juice (without added sugar). ?Vegetables ?Fresh or frozen vegetables (raw, steamed, roasted, or grilled). Low-sodium or reduced-sodium tomato and vegetable juice. Low-sodium or reduced-sodium tomato sauce and tomato paste. Low-sodium or reduced-sodium canned vegetables. ?Grains ?Whole-grain or whole-wheat bread. Whole-grain or whole-wheat pasta. Brown  rice. [place] Morrow. Bulgur. Whole-grain and low-sodium cereals. Pita bread. Low-fat, low-sodium crackers. Whole-wheat flour tortillas. ?Meats and other proteins ?Skinless chicken or [place]. Ground chicken or [place]. Pork with fat trimmed off. Fish and seafood. Egg whites. Dried beans, peas, or lentils. Unsalted nuts, nut butters, and seeds. Unsalted canned beans. Lean cuts of beef with fat trimmed off. Low-sodium, lean precooked or cured meat, such as sausages or meat loaves. ?Dairy ?Low-fat (1%) or fat-free (skim) milk. Reduced-fat, low-fat, or fat-free cheeses. Nonfat, low-sodium ricotta or cottage cheese. Low-fat or nonfat yogurt. Low-fat, low-sodium cheese. ?Fats and oils ?Soft margarine without trans fats. Vegetable oil. Reduced-fat, low-fat, or light mayonnaise and salad dressings (reduced-sodium). Canola, safflower, olive, avocado, soybean, and sunflower oils. Avocado. ?Seasonings and condiments ?Herbs. Spices. Seasoning mixes without salt. ?Other foods ?Unsalted popcorn and pretzels. Fat-free sweets. ?The items listed above may not be a complete list of foods and beverages you can eat. Contact a dietitian for more information. ?What foods should I avoid? ?Fruits ?Canned fruit in a light or heavy syrup. Fried fruit. Fruit in cream or butter sauce. ?Vegetables ?Creamed or fried vegetables. Vegetables in a cheese sauce. Regular canned vegetables (not low-sodium or reduced-sodium). Regular canned tomato sauce and paste (not low-sodium or reduced-sodium). Regular tomato and vegetable juice (not low-sodium or reduced-sodium). Angie Fava. Olives. ?Grains ?Baked goods made with fat, such as croissants, muffins, or some breads. Dry pasta or rice meal packs. ?Meats and other proteins ?Fatty cuts of meat. Ribs. Fried meat. Berniece Salines. Bologna, salami, and other precooked or cured meats, such as sausages or meat loaves. Fat from the back of a pig (fatback). Bratwurst. Salted nuts and seeds. Canned beans with added salt.  Canned or smoked fish. Whole eggs or egg yolks. Chicken or [place] with skin. ?Dairy ?Whole or 2% milk, cream, and half-and-half. Whole or full-fat cream cheese. Whole-fat or sweetened yogurt. Full-fat cheese. Nondairy creamers. Whipped toppings. Processed cheese and cheese spreads. ?Fats and oils ?Butter. Stick margarine. Lard. Shortening. Ghee. Bacon fat. Tropical oils, such as coconut, palm kernel, or palm oil. ?Seasonings and condiments ?Onion salt, garlic salt, seasoned salt, table salt, and sea salt. Worcestershire sauce. Tartar sauce. Barbecue sauce. Teriyaki sauce. Soy sauce, including reduced-sodium. Steak sauce. Canned and packaged gravies. Fish sauce. Oyster sauce. Cocktail sauce. Store-bought horseradish. Ketchup. Mustard. Meat flavorings and tenderizers. Bouillon cubes. Hot sauces. Pre-made or packaged marinades. Pre-made or packaged taco seasonings. Relishes. Regular salad dressings. ?Other foods ?Salted popcorn and pretzels. ?The items listed above may not be a complete list of foods and beverages you should avoid. Contact a dietitian for more information. ?Where to find more information ?National Heart, Lung, and Blood Institute: https://wilson-eaton.com/ ?American Heart Association: www.heart.org ?Academy of Nutrition and Dietetics: www.eatright.org ?[place]: www.kidney.org ?Summary ?The DASH eating plan is a healthy eating plan that has been shown to reduce high blood pressure (hypertension). It may also reduce your risk for type 2 diabetes, heart disease, and stroke. ?When on the DASH eating plan, aim to eat more fresh fruits and vegetables, whole grains, lean proteins, low-fat dairy, and heart-healthy fats. ?  With the DASH eating plan, you should limit salt (sodium) intake to 2,300 mg a day. If you have hypertension, you may need to reduce your sodium intake to 1,500 mg a day. ?Work with your health care provider or dietitian to adjust your eating plan to your individual calorie  needs. ?This information is not intended to replace advice given to you by your health care provider. Make sure you discuss any questions you have with your health care provider. ?Document Revised: 08/14/2019 Docum

## 2021-12-05 NOTE — Progress Notes (Signed)
? ?  Subjective:  ? ? Patient ID: Stacy Jensen, female    DOB: 03-22-1956, 66 y.o.   MRN: [id number] ? ?HPI ?Pt has had elevated blood pressure readings. Pt had wellness visit with Karle Starch last week. No headaches, blurred vision or other symptoms; did have an episode a few days ago that lasted a few hours. Pt currently on Lisinopril 5 mg daily.  ? ?Pt unsure if she is suppose to be on Fenofibrate and Praluent.  ? ?Reclast only available in 4 mg.  ? ?Incontinence seen urology they have her doing PT to help ? ? ?Review of Systems ? ?   ?Objective:  ? Physical Exam ?General-in no acute distress ?Eyes-no discharge ?Lungs-respiratory rate normal, CTA ?CV-no murmurs,RRR ?Extremities skin warm dry no edema ?Neuro grossly normal ?Behavior normal, alert ? ? ?Blood pressure still elevated ?Bump up dose of lisinopril ?Check metabolic 7 in 2 weeks ?I would expect creatinine to go up slightly ? ?   ?Assessment & Plan:  ?Check metabolic 7 in 2 weeks time ?Follow-up blood pressure within 6 weeks time ?Healthy diet regular activity ?Up dose of lisinopril as discussed above ? ? ?Stop fenofibrate doing very well on Praluent.  Patient has history of myalgias cannot tolerate statins ? ?

## 2021-12-07 ENCOUNTER — Telehealth: Payer: Self-pay | Admitting: Family Medicine

## 2021-12-07 NOTE — Telephone Encounter (Signed)
Praluent has been approved from 11/22/21-09/23/22.  ?

## 2021-12-08 NOTE — Telephone Encounter (Signed)
Good thank you

## 2021-12-11 ENCOUNTER — Ambulatory Visit (INDEPENDENT_AMBULATORY_CARE_PROVIDER_SITE_OTHER): Payer: Medicare Other | Admitting: Pharmacist

## 2021-12-11 DIAGNOSIS — T466X5A Adverse effect of antihyperlipidemic and antiarteriosclerotic drugs, initial encounter: Secondary | ICD-10-CM

## 2021-12-11 DIAGNOSIS — M791 Myalgia, unspecified site: Secondary | ICD-10-CM

## 2021-12-11 DIAGNOSIS — M8580 Other specified disorders of bone density and structure, unspecified site: Secondary | ICD-10-CM

## 2021-12-11 DIAGNOSIS — I10 Essential (primary) hypertension: Secondary | ICD-10-CM

## 2021-12-11 DIAGNOSIS — E7849 Other hyperlipidemia: Secondary | ICD-10-CM

## 2021-12-11 NOTE — Patient Instructions (Signed)
Stacy Jensen, ? ?It was great to talk to you today! ? ?Please call me with any questions or concerns. ? ?Visit Information ? ?Following are the goals we discussed today:  ? Goals Addressed   ? ?  ?  ?  ?  ? This Visit's Progress  ?  Medication Management     ?  Patient Goals/Self-Care Activities ?Patient will:  ?Take medications as prescribed ?Collaborate with provider on medication access solutions ? ? ? ?  ? ?  ?  ? ?Follow-up plan: Next PCP appointment scheduled for: 01/03/22 ? ?Patient verbalizes understanding of instructions and care plan provided today and agrees to view in [place]. Active MyChart status confirmed with patient.   ? ?Please call the care guide team at [phone] if you need to cancel or reschedule your appointment.  ? ?Kennon Holter, PharmD, BCACP, CPP ?Clinical Pharmacist Practitioner ?[facility] ?[phone]  ?

## 2021-12-11 NOTE — Chronic Care Management (AMB) (Signed)
? ? ?Chronic Care Management ?Pharmacy Note ? ?12/11/2021 ?Name:  Stacy Jensen MRN:  [id number] DOB:  [birth year] ? ?Summary: ?Hypertension ?Blood pressure under suboptimal control. Blood pressure is above goal of <140/90 mmHg per 2022 AAFP guidelines. ?Recent changes: lisinopril increased to 10 mg by mouth daily by primary care provider last week due to elevated home and office blood pressure ?Current home blood pressure: 150-180s/80s ?Continue lisinopril 10 mg by mouth once daily ?Follow-up with primary care provider in 3 weeks to recheck blood pressure. If still elevated, can continue titration of lisinopril or add additional pharmacologic agent such as calcium channel blocker or thiazide ? ?Hyperlipidemia ?Significantly improved from baseline. Recent LDL improved from 194>77 after several doses of PCSK9i. LDL near goal of <70 due to very high risk given 10-year risk >20% per 2020 AACE/ACE guidelines. Triglycerides remain at goal of <150 per 2020 AACE/ACE guidelines. ?Current medications: alirocumab (Praluent) 75 mg subcutaneously every 2 weeks and fish oil 1 capsule by mouth daily ?Recent changes: primary care provider discontinued fenofibrate last week ?Intolerances:  statins (body aches); patient unable to remember names of statins but per chart review looks like she even tried rosuvastatin twice weekly and was unable to tolerate; ezetimibe tried as well and was discontinued due to ineffectiveness per patient ?Continue alirocumab (Praluent) 75 mg subcutaneously every 2 weeks and fish oil 1 capsule by mouth daily ?Re-check fasting lipid panel every 3-6 months. ?If triglycerides increase, may need to consider restarting fibrate.   ? ?Osteopenia ?Patient overdue for Reclast infusion (due October 2022) ?Last DEXA (03/31/20): R femur neck T -2.0 and L forearm radius T -2.0 ?Patient and primary care provider looking into pricing for Reclast infusion. ?Recommend 1,000 mg of elemental calcium per day from diet or  supplementation. Supplemental calcium not necessary if appropriate amount obtained through diet. ?Recommend 800 - 2,000 IU of vitamin D supplementation per day to maintain adequate vitamin D levels ?Recommend walking, low impact aerobic exercise, or strength training ? ?Subjective: ?Stacy Jensen is an 66 y.o. year old female who is a primary patient of Luking, Elayne Snare, MD.  The CCM team was consulted for assistance with disease management and care coordination needs.   ? ?Engaged with patient by telephone for follow up visit in response to provider referral for pharmacy case management and/or care coordination services.  ? ?Consent to Services:  ?The patient was given information about Chronic Care Management services, agreed to services, and gave verbal consent prior to initiation of services.  Please see initial visit note for detailed documentation.  ? ?Patient Care Team: ?Kathyrn Drown, MD as PCP - General (Family Medicine) ?Beryle Lathe, [facility] (Pharmacist) ? ?Objective: ? ?Lab Results  ?Component Value Date  ? CREATININE 1.31 (H) 11/28/2021  ? CREATININE 1.44 (H) 09/28/2021  ? CREATININE 1.25 (H) 03/28/2021  ? ? ?Lab Results  ?Component Value Date  ? HGBA1C 5.7 (H) 09/28/2021  ? ?Last diabetic Eye exam:  ?Lab Results  ?Component Value Date/Time  ? HMDIABEYEEXA No Retinopathy 09/25/2015 12:00 AM  ?  ?Last diabetic Foot exam: No results found for: HMDIABFOOTEX  ? ?   ?Component Value Date/Time  ? CHOL 151 11/22/2021 0955  ? TRIG 141 11/22/2021 0955  ? HDL 49 11/22/2021 0955  ? CHOLHDL 3.1 11/22/2021 0955  ? CHOLHDL 4.6 11/16/2014 1326  ? VLDL 36 11/16/2014 1326  ? [place] 77 11/22/2021 0955  ? ? ?Hepatic Function Latest Ref Rng & Units 09/28/2021 03/28/2021 09/26/2020  ?Total Protein 6.0 -  8.5 g/dL 7.1 7.0 7.2  ?Albumin 3.8 - 4.8 g/dL 4.5 4.6 4.3  ?AST 0 - 40 IU/L 28 29 17   ?ALT 0 - 32 IU/L 19 19 12   ?Alk Phosphatase 44 - 121 IU/L 48 45 77  ?Total Bilirubin 0.0 - 1.2 mg/dL 0.5 0.4 0.4  ?Bilirubin, Direct 0.00  - 0.40 mg/dL 0.18 0.14 0.11  ? ? ?Lab Results  ?Component Value Date/Time  ? TSH 2.040 09/28/2021 09:37 AM  ? TSH 3.100 03/28/2021 10:12 AM  ? FREET4 1.15 07/11/2017 10:37 AM  ? FREET4 1.62 01/09/2017 10:21 AM  ? ? ?CBC Latest Ref Rng & Units 07/08/2020 12/21/2010 12/20/2010  ?WBC 4.0 - 10.5 K/uL - 9.1 8.4  ?Hemoglobin 12.0 - 15.0 g/dL 16.7(H) 12.8 13.0  ?Hematocrit 36.0 - 46.0 % 49.0(H) 38.3 38.2  ?Platelets 150 - 400 K/uL - 159 164  ? ? ?Lab Results  ?Component Value Date/Time  ? VD25OH 42 06/07/2014 10:11 AM  ? VD25OH 56 08/27/2013 11:06 AM  ? ? ?Clinical ASCVD: No  ?The 10-year ASCVD risk score (Arnett DK, et al., 2019) is: 21.7% ?  Values used to calculate the score: ?    Age: 40 years ?    Sex: Female ?    Is Non-Hispanic African American: No ?    Diabetic: Yes ?    Tobacco smoker: No ?    Systolic Blood Pressure: 026 mmHg ?    Is BP treated: Yes ?    HDL Cholesterol: 49 mg/dL ?    Total Cholesterol: 151 mg/dL   ? ?Social History  ? ?Tobacco Use  ?Smoking Status Never  ?Smokeless Tobacco Never  ? ?BP Readings from Last 3 Encounters:  ?12/05/21 (!) 144/94  ?11/28/21 (!) 152/84  ?10/09/21 (!) 151/78  ? ?Pulse Readings from Last 3 Encounters:  ?12/05/21 65  ?11/28/21 97  ?10/09/21 (!) 102  ? ?Wt Readings from Last 3 Encounters:  ?12/05/21 158 lb (71.7 kg)  ?11/28/21 159 lb 9.6 oz (72.4 kg)  ?10/09/21 159 lb 3.2 oz (72.2 kg)  ? ? ?Assessment: Review of patient past medical history, allergies, medications, health status, including review of consultants reports, laboratory and other test data, was performed as part of comprehensive evaluation and provision of chronic care management services.  ? ?SDOH:  (Social Determinants of Health) assessments and interventions performed:  ? ? ?CCM Care Plan ? ?Allergies  ?Allergen Reactions  ? Codeine Nausea And Vomiting  ? Statins   ?  Body aches  ? ? ?Medications Reviewed Today   ? ? Reviewed by Beryle Lathe, [facility] (Pharmacist) on 12/11/21 at [phone]  Med List Status:  <None>  ? ?Medication Order Taking? Sig Documenting Provider Last Dose Status Informant  ?albuterol (VENTOLIN HFA) 108 (90 Base) MCG/ACT inhaler [id number] Yes Inhale 2 puffs into the lungs every 6 (six) hours as needed for wheezing. Kathyrn Drown, MD Taking Active   ?Alirocumab (PRALUENT) 75 MG/ML SOAJ [id number] Yes Inject 75 mg into the skin every 14 (fourteen) days. Kathyrn Drown, MD Taking Active   ?Calcium Citrate (CITRACAL PO) [id number] Yes Take 1 tablet by mouth daily. [provider] Taking Active   ?         ?Med Note Beryle Lathe   Fri Oct 06, 2021 10:44 AM) Dewaine Conger 1 extra tablet by mouth on Sunday/Wednesday  ?co-enzyme Q-10 30 MG capsule [id number] Yes Take 30 mg by mouth daily. [provider] Taking Active   ?         ?  Med Note Celesta Aver, [facility] M   Tue Mar 22, 2020  8:54 AM) 200 mg daily  ?famotidine (PEPCID) 40 MG tablet [id number] Yes Take one tablet by mouth once a day  ?Patient taking differently: Take 20 mg by mouth daily.  ? Kathyrn Drown, MD Taking Active   ?hydrocortisone (CORTEF) 10 MG tablet [id number] Yes TAKE 2 TABLETS BY MOUTH ONCE DAILY WITH FOOD Kathyrn Drown, MD Taking Active   ?levothyroxine (SYNTHROID) 75 MCG tablet [id number] Yes TAKE 1 TABLET BY MOUTH ONCE DAILY TAKE  5  DAYS  A  WEEK Luking, Elayne Snare, MD Taking Active   ?         ?Med Note Waldo Laine, Gwenyth Allegra   Fri Oct 06, 2021 10:43 AM) Does not take on Sunday/Wednesday  ?lisinopril (ZESTRIL) 10 MG tablet [id number] Yes TAKE 1 TABLET BY MOUTH DAILY Wolfgang Phoenix, Elayne Snare, MD Taking Active   ?Multiple Vitamins-Minerals (MULTIVITAMIN WITH MINERALS) tablet [id number] Yes Take 1 tablet by mouth daily. [provider] Taking Active   ?         ?Med Note Beryle Lathe   Fri Oct 06, 2021 10:47 AM) [place]ana Pai Womens 50+  ?Omega-3 Fatty Acids (OMEGA-3 FISH OIL) 1200 MG CAPS [id number] Yes Take 1 capsule by mouth daily. [provider] Taking Active   ?OVER THE COUNTER MEDICATION  [id number] Yes magnessium 26m daily ? ?b12 5,000 mcg daily ? ?Colon clenz one daly as needed ? ?Apple cider vinegar. One oz shots 2 -3 times daily [provider] Taking Active   ?polyethylene glycol-electrolytes

## 2021-12-14 DIAGNOSIS — M6289 Other specified disorders of muscle: Secondary | ICD-10-CM | POA: Diagnosis not present

## 2021-12-14 DIAGNOSIS — N3946 Mixed incontinence: Secondary | ICD-10-CM | POA: Diagnosis not present

## 2021-12-14 DIAGNOSIS — M62838 Other muscle spasm: Secondary | ICD-10-CM | POA: Diagnosis not present

## 2021-12-14 DIAGNOSIS — R350 Frequency of micturition: Secondary | ICD-10-CM | POA: Diagnosis not present

## 2021-12-14 DIAGNOSIS — M6281 Muscle weakness (generalized): Secondary | ICD-10-CM | POA: Diagnosis not present

## 2021-12-19 DIAGNOSIS — N289 Disorder of kidney and ureter, unspecified: Secondary | ICD-10-CM | POA: Diagnosis not present

## 2021-12-19 DIAGNOSIS — I10 Essential (primary) hypertension: Secondary | ICD-10-CM | POA: Diagnosis not present

## 2021-12-20 LAB — BASIC METABOLIC PANEL
BUN/Creatinine Ratio: 11 — ABNORMAL LOW (ref 12–28)
BUN: 12 mg/dL (ref 8–27)
CO2: 22 mmol/L (ref 20–29)
Calcium: 9.7 mg/dL (ref 8.7–10.3)
Chloride: 105 mmol/L (ref 96–106)
Creatinine, Ser: 1.05 mg/dL — ABNORMAL HIGH (ref 0.57–1.00)
Glucose: 100 mg/dL — ABNORMAL HIGH (ref 70–99)
Potassium: 4.2 mmol/L (ref 3.5–5.2)
Sodium: 143 mmol/L (ref 134–144)
eGFR: 58 mL/min/{1.73_m2} — ABNORMAL LOW (ref >59)

## 2021-12-22 DIAGNOSIS — I10 Essential (primary) hypertension: Secondary | ICD-10-CM

## 2021-12-22 DIAGNOSIS — E7849 Other hyperlipidemia: Secondary | ICD-10-CM

## 2021-12-28 DIAGNOSIS — R350 Frequency of micturition: Secondary | ICD-10-CM | POA: Diagnosis not present

## 2021-12-28 DIAGNOSIS — M6289 Other specified disorders of muscle: Secondary | ICD-10-CM | POA: Diagnosis not present

## 2021-12-28 DIAGNOSIS — M6281 Muscle weakness (generalized): Secondary | ICD-10-CM | POA: Diagnosis not present

## 2021-12-28 DIAGNOSIS — M62838 Other muscle spasm: Secondary | ICD-10-CM | POA: Diagnosis not present

## 2021-12-28 DIAGNOSIS — N3946 Mixed incontinence: Secondary | ICD-10-CM | POA: Diagnosis not present

## 2022-01-03 ENCOUNTER — Ambulatory Visit (INDEPENDENT_AMBULATORY_CARE_PROVIDER_SITE_OTHER): Payer: Medicare HMO | Admitting: Family Medicine

## 2022-01-03 VITALS — BP 136/80 | HR 88 | Ht 60.0 in | Wt 158.8 lb

## 2022-01-03 DIAGNOSIS — E249 Cushing's syndrome, unspecified: Secondary | ICD-10-CM | POA: Diagnosis not present

## 2022-01-03 DIAGNOSIS — R7303 Prediabetes: Secondary | ICD-10-CM

## 2022-01-03 DIAGNOSIS — G5622 Lesion of ulnar nerve, left upper limb: Secondary | ICD-10-CM | POA: Diagnosis not present

## 2022-01-03 DIAGNOSIS — E7849 Other hyperlipidemia: Secondary | ICD-10-CM

## 2022-01-03 DIAGNOSIS — E039 Hypothyroidism, unspecified: Secondary | ICD-10-CM | POA: Diagnosis not present

## 2022-01-03 DIAGNOSIS — N289 Disorder of kidney and ureter, unspecified: Secondary | ICD-10-CM | POA: Diagnosis not present

## 2022-01-03 DIAGNOSIS — R5383 Other fatigue: Secondary | ICD-10-CM | POA: Diagnosis not present

## 2022-01-03 DIAGNOSIS — I10 Essential (primary) hypertension: Secondary | ICD-10-CM | POA: Diagnosis not present

## 2022-01-03 MED ORDER — AMLODIPINE BESYLATE 2.5 MG PO TABS: 2.5000 mg | ORAL_TABLET | Freq: Every day | ORAL | 6 refills | Status: DC

## 2022-01-03 NOTE — Progress Notes (Signed)
? ?  Subjective:  ? ? Patient ID: Stacy Jensen, female    DOB: May 26, 1956, 66 y.o.   MRN: [id number] ? ?HPI ? ?Patient arrives for a 4 week follow up on blood pressure- Patient states blood pressure is still running high at home using her wrist cuff. ?I reviewed over blood pressure readings several of them are high but other ones look pretty good.  She denies any major setbacks. ?Essential hypertension, benign ? ?Entrapment of left ulnar nerve ? ?Other fatigue - Plan: CBC with Differential/Platelet ? ?Hypothyroidism, unspecified type - Plan: TSH ? ?Renal insufficiency - Plan: Basic metabolic panel ? ?Other hyperlipidemia - Plan: Lipid panel ? ?Prediabetes - Plan: Hemoglobin A1c ? ?Cushing syndrome ([place]) - Plan: Cortisol ?She does complain of intermittent numbness into the ring finger and small finger on the left hand she also complains of intermittent discomfort in the deltoid region. ? ?She also relates fatigue tiredness feeling rundown. ? ?She does try to watch how she eats and try to eat healthy ?She is tolerating the cholesterol medicine on a regular basis ?Review of Systems ? ?   ?Objective:  ? Physical Exam ? ?General-in no acute distress ?Eyes-no discharge ?Lungs-respiratory rate normal, CTA ?CV-no murmurs,RRR ?Extremities skin warm dry no edema ?Neuro grossly normal ?Behavior normal, alert ? ? ? ?   ?Assessment & Plan:  ?1. Essential hypertension, benign ?Blood pressure good control continue current measures.  Encourage patient to consider getting a blood pressure cuff that goes around the bicep rather than the wrist.  Add amlodipine 2.5 mg synergistically to go along with her lisinopril ? ?2. Entrapment of left ulnar nerve ?It is hard to know if this entrapment of the ulnar nerve or a pinched nerve in her neck I told her if her symptoms get worse and when she is ready we can run further testing including nerve conduction studies as well as cervical x-rays.  Patient not having any weakness currently will watch  for now. ? ?3. Other fatigue ?Fatigue tiredness check a CBC on next blood work.  Healthy diet recommended. ?- CBC with Differential/Platelet ? ?4. Hypothyroidism, unspecified type ?Continue levothyroxine check thyroid function before next visit ?- TSH ? ?5. Renal insufficiency ?Continue current medications for blood pressure.  But add 2.5 mg amlodipine to try to help with blood pressure. ?- Basic metabolic panel ? ?6. Other hyperlipidemia ?Continue Praluent ?- Lipid panel ? ?7. Prediabetes ?Healthy diet continue staying active ?- Hemoglobin A1c ? ?8. Cushing syndrome ([place]) ?Continue hydrocortisone.  Check cortisol level before next visit ?- Cortisol ?Follow-up in 4 months ? ?

## 2022-01-18 ENCOUNTER — Ambulatory Visit (HOSPITAL_BASED_OUTPATIENT_CLINIC_OR_DEPARTMENT_OTHER): Payer: Medicare HMO | Admitting: Anesthesiology

## 2022-01-18 ENCOUNTER — Encounter (INDEPENDENT_AMBULATORY_CARE_PROVIDER_SITE_OTHER): Payer: Self-pay | Admitting: *Deleted

## 2022-01-18 ENCOUNTER — Other Ambulatory Visit: Payer: Self-pay

## 2022-01-18 ENCOUNTER — Encounter (HOSPITAL_COMMUNITY): Payer: Self-pay | Admitting: Internal Medicine

## 2022-01-18 ENCOUNTER — Ambulatory Visit (HOSPITAL_COMMUNITY): Payer: Medicare HMO | Admitting: Anesthesiology

## 2022-01-18 ENCOUNTER — Ambulatory Visit (HOSPITAL_COMMUNITY)
Admission: RE | Admit: 2022-01-18 | Discharge: 2022-01-18 | Disposition: A | Discharge status: Home / Self Care | Payer: Medicare HMO | Source: Ambulatory Visit | Attending: Internal Medicine | Admitting: Internal Medicine

## 2022-01-18 ENCOUNTER — Encounter (HOSPITAL_COMMUNITY)
Admission: RE | Disposition: A | Discharge status: Home / Self Care | Payer: Self-pay | Source: Ambulatory Visit | Attending: Internal Medicine

## 2022-01-18 DIAGNOSIS — K635 Polyp of colon: Secondary | ICD-10-CM | POA: Diagnosis not present

## 2022-01-18 DIAGNOSIS — K219 Gastro-esophageal reflux disease without esophagitis: Secondary | ICD-10-CM | POA: Diagnosis not present

## 2022-01-18 DIAGNOSIS — E240 Pituitary-dependent Cushing's disease: Secondary | ICD-10-CM | POA: Diagnosis not present

## 2022-01-18 DIAGNOSIS — I10 Essential (primary) hypertension: Secondary | ICD-10-CM | POA: Diagnosis not present

## 2022-01-18 DIAGNOSIS — K6389 Other specified diseases of intestine: Secondary | ICD-10-CM | POA: Diagnosis not present

## 2022-01-18 DIAGNOSIS — K644 Residual hemorrhoidal skin tags: Secondary | ICD-10-CM | POA: Diagnosis not present

## 2022-01-18 DIAGNOSIS — K5730 Diverticulosis of large intestine without perforation or abscess without bleeding: Secondary | ICD-10-CM

## 2022-01-18 DIAGNOSIS — Z1211 Encounter for screening for malignant neoplasm of colon: Secondary | ICD-10-CM | POA: Insufficient documentation

## 2022-01-18 DIAGNOSIS — G4730 Sleep apnea, unspecified: Secondary | ICD-10-CM | POA: Diagnosis not present

## 2022-01-18 DIAGNOSIS — E039 Hypothyroidism, unspecified: Secondary | ICD-10-CM | POA: Insufficient documentation

## 2022-01-18 DIAGNOSIS — D123 Benign neoplasm of transverse colon: Secondary | ICD-10-CM | POA: Insufficient documentation

## 2022-01-18 HISTORY — PX: COLONOSCOPY WITH PROPOFOL: SHX5780

## 2022-01-18 HISTORY — PX: POLYPECTOMY: SHX5525

## 2022-01-18 LAB — HM COLONOSCOPY

## 2022-01-18 SURGERY — COLONOSCOPY WITH PROPOFOL
Anesthesia: General

## 2022-01-18 MED ORDER — LACTATED RINGERS IV SOLN: INTRAVENOUS | Status: DC

## 2022-01-18 MED ORDER — LIDOCAINE HCL (CARDIAC) PF 100 MG/5ML IV SOSY
PREFILLED_SYRINGE | INTRAVENOUS | Status: DC | PRN
  Administered 2022-01-18: 50 mg via INTRAVENOUS

## 2022-01-18 MED ORDER — PROPOFOL 10 MG/ML IV BOLUS
INTRAVENOUS | Status: DC | PRN
  Administered 2022-01-18: 20 mg via INTRAVENOUS
  Administered 2022-01-18: 100 mg via INTRAVENOUS
  Administered 2022-01-18: 30 mg via INTRAVENOUS

## 2022-01-18 MED ORDER — PROPOFOL 500 MG/50ML IV EMUL
INTRAVENOUS | Status: DC | PRN
Start: 2022-01-18 — End: 2022-01-18
  Administered 2022-01-18: 150 ug/kg/min via INTRAVENOUS

## 2022-01-18 NOTE — Transfer of Care (Signed)
Immediate Anesthesia Transfer of Care Note ? ?Patient: Stacy Jensen ? ?Procedure(s) Performed: COLONOSCOPY WITH PROPOFOL ?POLYPECTOMY ? ?Patient Location: Endoscopy Unit ? ?Anesthesia Type:General ? ?Level of Consciousness: drowsy ? ?Airway & Oxygen Therapy: Patient Spontanous Breathing ? ?Post-op Assessment: Report given to RN and Post -op Vital signs reviewed and stable ? ?Post vital signs: Reviewed and stable ? ?Last Vitals:  ?Vitals Value Taken Time  ?BP    ?Temp    ?Pulse 76   ?Resp 15   ?SpO2 97%   ? ? ?Last Pain:  ?Vitals:  ? 01/18/22 0810  ?TempSrc:   ?PainSc: 0-No pain  ?   ? ?Patients Stated Pain Goal: 7 (01/18/22 0717) ? ?Complications: No notable events documented. ?

## 2022-01-18 NOTE — Discharge Instructions (Addendum)
No aspirin or NSAIDs for 24 hours ?Resume usual medications as before. ?High-fiber diet ?No driving for 24 hours ?Physician will call with biopsy results and further recommendations. ?Take your steroid medication as soon as you get home ?

## 2022-01-18 NOTE — Anesthesia Preprocedure Evaluation (Addendum)
Anesthesia Evaluation  ?Patient identified by MRN, date of birth, ID band ?Patient awake ? ? ? ?Reviewed: ?Allergy & Precautions, NPO status , Patient's Chart, lab work & pertinent test results ? ?Airway ?Mallampati: II ? ?TM Distance: >3 FB ?Neck ROM: Full ? ? ? Dental ? ?(+) Dental Advisory Given, Missing, Chipped ?  ?Pulmonary ?sleep apnea and Continuous Positive Airway Pressure Ventilation ,  ?  ?Pulmonary exam normal ?breath sounds clear to auscultation ? ? ? ? ? ? Cardiovascular ?hypertension, Pt. on medications ?Normal cardiovascular exam ?Rhythm:Regular Rate:Normal ? ? ?  ?Neuro/Psych ?negative neurological ROS ? negative psych ROS  ? GI/Hepatic ?Neg liver ROS, GERD  Medicated,  ?Endo/Other  ?Hypothyroidism Adrenal insufficiency (Addison's disease) ([place]) ?Pituitary Cushing's syndrome ([place]) ? ? ? Renal/GU ?Renal disease  ?negative genitourinary ?  ?Musculoskeletal ? ?(+) Arthritis , Osteoarthritis,   ? Abdominal ?  ?Peds ?negative pediatric ROS ?(+)  Hematology ? ?(+) Blood dyscrasia, anemia ,   ?Anesthesia Other Findings ? ? Reproductive/Obstetrics ?negative OB ROS ? ?  ? ? ? ? ? ? ? ? ? ? ? ? ? ?  ?  ? ? ? ? ? ? ? ?Anesthesia Physical ?Anesthesia Plan ? ?ASA: 3 ? ?Anesthesia Plan: General  ? ?Post-op Pain Management: Minimal or no pain anticipated  ? ?Induction: Intravenous ? ?PONV Risk Score and Plan: Propofol infusion ? ?Airway Management Planned: Nasal Cannula and Natural Airway ? ?Additional Equipment:  ? ?Intra-op Plan:  ? ?Post-operative Plan:  ? ?Informed Consent: I have reviewed the patients History and Physical, chart, labs and discussed the procedure including the risks, benefits and alternatives for the proposed anesthesia with the patient or authorized representative who has indicated his/her understanding and acceptance.  ? ? ? ?Dental advisory given ? ?Plan Discussed with: CRNA and Surgeon ? ?Anesthesia Plan Comments:   ? ? ? ? ? ? ?Anesthesia Quick  Evaluation ? ?

## 2022-01-18 NOTE — Anesthesia Postprocedure Evaluation (Signed)
Anesthesia Post Note ? ?Patient: Stacy Jensen ? ?Procedure(s) Performed: COLONOSCOPY WITH PROPOFOL ?POLYPECTOMY ? ?Patient location during evaluation: Endoscopy ?Anesthesia Type: General ?Level of consciousness: awake and alert ?Pain management: pain level controlled ?Vital Signs Assessment: post-procedure vital signs reviewed and stable ?Respiratory status: spontaneous breathing, nonlabored ventilation and respiratory function stable ?Cardiovascular status: blood pressure returned to baseline and stable ?Postop Assessment: no apparent nausea or vomiting ?Anesthetic complications: no ? ? ?No notable events documented. ? ? ?Last Vitals:  ?Vitals:  ? 01/18/22 0734 01/18/22 0839  ?BP: 136/68 (!) 113/54  ?Pulse:  76  ?Resp:  15  ?Temp:    ?SpO2:  97%  ?  ?Last Pain:  ?Vitals:  ? 01/18/22 0847  ?TempSrc:   ?PainSc: 0-No pain  ? ? ?  ?  ?  ?  ?  ?  ? ?Teleshia Lemere C Richardine Peppers ? ? ? ? ?

## 2022-01-18 NOTE — Op Note (Signed)
[facility] ?Patient Name: Stacy Jensen ?Procedure Date: 01/18/2022 7:50 AM ?MRN: [id number] ?Date of Birth: [birth year] ?Attending MD: Hildred Laser , MD ?CSN: [id number] ?Age: 66 ?Admit Type: Outpatient ?Procedure:                Colonoscopy ?Indications:              Screening for colorectal malignant neoplasm ?Providers:                Hildred Laser, MD, Charlsie Quest Theda Sers RN, RN, Kenney Houseman  ?                          [place] ?Referring MD:             Sallee Lange, MD ?Medicines:                Propofol per Anesthesia ?Complications:            No immediate complications. ?Estimated Blood Loss:     Estimated blood loss was minimal. ?Procedure:                Pre-Anesthesia Assessment: ?                          - Prior to the procedure, a History and Physical  ?                          was performed, and patient medications and  ?                          allergies were reviewed. The patient's tolerance of  ?                          previous anesthesia was also reviewed. The risks  ?                          and benefits of the procedure and the sedation  ?                          options and risks were discussed with the patient.  ?                          All questions were answered, and informed consent  ?                          was obtained. Prior Anticoagulants: The patient has  ?                          taken no previous anticoagulant or antiplatelet  ?                          agents except for aspirin. ASA Grade Assessment:  ?                          III - A patient with severe systemic disease. After  ?  reviewing the risks and benefits, the patient was  ?                          deemed in satisfactory condition to undergo the  ?                          procedure. ?                          After obtaining informed consent, the colonoscope  ?                          was passed under direct vision. Throughout the  ?                          procedure, the patient's blood  pressure, pulse, and  ?                          oxygen saturations were monitored continuously. The  ?                          PCF-HQ190L ([id number]) scope was introduced through  ?                          the anus and advanced to the the cecum, identified  ?                          by appendiceal orifice and ileocecal valve. The  ?                          colonoscopy was performed without difficulty. The  ?                          patient tolerated the procedure well. The quality  ?                          of the bowel preparation was good. The ileocecal  ?                          valve, appendiceal orifice, and rectum were  ?                          photographed. ?Scope In: 8:12:48 AM ?Scope Out: 8:36:44 AM ?Scope Withdrawal Time: 0 hours 17 minutes 33 seconds  ?Total Procedure Duration: 0 hours 23 minutes 56 seconds  ?Findings: ?     The perianal and digital rectal examinations were normal. ?     A diffuse area of moderate melanosis was found in the entire colon. ?     Two flat polyps were found in the transverse colon and hepatic flexure.  ?     The polyps were 4 to 7 mm in size. These polyps were removed with a cold  ?     snare. Resection and retrieval were complete. The pathology specimen was  ?     placed into Bottle Number 1. ?  Four polyps were found in the sigmoid colon, splenic flexure and  ?     transverse colon. The polyps were small in size. These were biopsied  ?     with a cold forceps for histology. The pathology specimen was placed  ?     into Bottle Number 1. ?     A few small-mouthed diverticula were found in the sigmoid colon. ?     External hemorrhoids were found during retroflexion. The hemorrhoids  ?     were medium-sized. ?Impression:               - Melanosis in the colon. ?                          - Two 4 to 7 mm polyps in the transverse colon and  ?                          at the hepatic flexure, removed with a cold snare.  ?                          Resected and retrieved. ?                           - Four small polyps in the sigmoid colon, at the  ?                          splenic flexure and in the transverse colon.  ?                          Biopsied. ?                          - Diverticulosis in the sigmoid colon. ?                          - External hemorrhoids. ?Moderate Sedation: ?     Per Anesthesia Care ?Recommendation:           - Patient has a contact number available for  ?                          emergencies. The signs and symptoms of potential  ?                          delayed complications were discussed with the  ?                          patient. Return to normal activities tomorrow.  ?                          Written discharge instructions were provided to the  ?                          patient. ?                          - High fiber diet today. ?                          -  Continue present medications. ?                          - No aspirin, ibuprofen, naproxen, or other  ?                          non-steroidal anti-inflammatory drugs for 1 day. ?                          - Await pathology results. ?                          - Repeat colonoscopy is recommended. The  ?                          colonoscopy date will be determined after pathology  ?                          results from today's exam become available for  ?                          review. ?Procedure Code(s):        --- Professional --- ?                          [id number], Colonoscopy, flexible; with removal of  ?                          tumor(s), polyp(s), or other lesion(s) by snare  ?                          technique ?                          45380, 59, Colonoscopy, flexible; with biopsy,  ?                          single or multiple ?Diagnosis Code(s):        --- Professional --- ?                          Z12.11, Encounter for screening for malignant  ?                          neoplasm of colon ?                          K63.89, Other specified diseases of intestine ?                           K63.5, Polyp of colon ?                          K64.4, Residual hemorrhoidal skin tags ?                          K57.30, Diverticulosis of large intestine without  ?  perforation or abscess without bleeding ?CPT copyright 2019 American Medical Association. All rights reserved. ?The codes documented in this report are preliminary and upon coder review may  ?be revised to meet current compliance requirements. ?Hildred Laser, MD ?Hildred Laser, MD ?01/18/2022 8:45:54 AM ?This report has been signed electronically. ?Number of Addenda: 0 ?

## 2022-01-18 NOTE — H&P (Signed)
Stacy Jensen is an 66 y.o. female.   ?Chief Complaint: Patient is here for colonoscopy ?HPI: Patient is 66 year old Caucasian female who is here for screening colonoscopy.  This is her first exam.  She denies abdominal pain change in bowel habits or rectal bleeding. ?He does not take daily aspirin or anticoagulants.  He uses Goody powder on as-needed basis for headache. ?Family history is negative for colon cancer. ? ? ?Past Medical History:  ?Diagnosis Date  ? Anemia   ? Arthritis   ? Cushing's syndrome (Stacy Jensen)   ? GERD (gastroesophageal reflux disease) 04/11/2020  ? Hypertension   ? Hypertriglyceridemia   ? Hypopituitarism (Stacy Jensen) 09/29/2021  ? Had cushings syndrome with pituitary growth, then had pituitary excision   ? Osteoporosis   ? Pituitary Cushing syndrome (Stacy Jensen)   ? tumor removed  ? Sleep apnea 2007  ? ? ?Past Surgical History:  ?Procedure Laterality Date  ? PITUITARY EXCISION    ? TUBAL LIGATION    ? ? ?Family History  ?Problem Relation Age of Onset  ? Hypertension Mother   ? Hypertension Father   ? Heart attack Father   ? Colon cancer Neg Hx   ? ?Social History:  reports that she has never smoked. She has never used smokeless tobacco. She reports that she does not drink alcohol and does not use drugs. ? ?Allergies:  ?Allergies  ?Allergen Reactions  ? Codeine Nausea And Vomiting  ? Statins Other (See Comments)  ?  Body aches  ? ? ?Medications Prior to Admission  ?Medication Sig Dispense Refill  ? acetaminophen (TYLENOL) 650 MG CR tablet Take 650 mg by mouth every 8 (eight) hours as needed for pain.    ? Alirocumab (PRALUENT) 75 MG/ML SOAJ Inject 75 mg into the skin every 14 (fourteen) days. 2 mL 11  ? amLODipine (NORVASC) 2.5 MG tablet Take 1 tablet (2.5 mg total) by mouth daily. 30 tablet 6  ? APPLE CIDER VINEGAR PO Take 30 mLs by mouth in the morning and at bedtime.    ? Aspirin-Acetaminophen-Caffeine (GOODYS EXTRA STRENGTH) 500-325-65 MG PACK Take 1 packet by mouth daily as needed (pain.).    ? B  Complex-C (B-COMPLEX WITH VITAMIN C) tablet Take 1 tablet by mouth every evening.    ? Calcium-Phosphorus-Vitamin D (CITRACAL +D3 PO) Take 1 tablet by mouth See admin instructions. Take 1 tablet by mouth twice daily on Sundays & Wednesdays. ?Take 1 tablet by mouth once daily on Mondays, Tuesdays, Thursdays, Fridays & Saturdays.    ? cetirizine (ZYRTEC) 10 MG tablet Take 10 mg by mouth at bedtime.    ? Coenzyme Q10 200 MG capsule Take 200 mg by mouth in the morning.    ? Cyanocobalamin (VITAMIN B-12 PO) Take 1 tablet by mouth in the morning.    ? famotidine (PEPCID) 20 MG tablet Take 20 mg by mouth in the morning.    ? hydrocortisone (CORTEF) 10 MG tablet TAKE 2 TABLETS BY MOUTH ONCE DAILY WITH FOOD (Patient taking differently: Take 10 mg by mouth in the morning and at bedtime.) 180 tablet 1  ? levothyroxine (SYNTHROID) 75 MCG tablet TAKE 1 TABLET BY MOUTH ONCE DAILY TAKE  5  DAYS  A  WEEK (Patient taking differently: Take 75 mcg by mouth See admin instructions. Take 1 tablet (75 mcg) by mouth on Mondays, Tuesdays, Thursdays, Fridays & Saturdays.) 90 tablet 1  ? lisinopril (ZESTRIL) 10 MG tablet TAKE 1 TABLET BY MOUTH DAILY 90 tablet 1  ? Magnesium  200 MG TABS Take 200 mg by mouth at bedtime.    ? Misc Natural Products (COLON CLEANSE) CAPS Take 1 capsule by mouth every evening.    ? Multiple Vitamins-Minerals (MULTIVITAMIN WITH MINERALS) tablet Take 1 tablet by mouth in the morning. Centrum Silver for Women 50+    ? Omega-3 Fatty Acids (FISH OIL TRIPLE STRENGTH) 1400 MG CAPS Take 1,400 mg by mouth in the morning.    ? polyethylene glycol-electrolytes (NULYTELY) 420 g solution Take 4,000 mLs by mouth once.    ? albuterol (VENTOLIN HFA) 108 (90 Base) MCG/ACT inhaler Inhale 2 puffs into the lungs every 6 (six) hours as needed for wheezing. (Patient not taking: Reported on 01/12/2022) 1 each 2  ? ? ?No results found for this or any previous visit (from the past 48 hour(s)). ?No results found. ? ?Review of Systems ? ?Blood  pressure 136/68, pulse 92, temperature 98 ?F (36.7 ?C), temperature source Oral, resp. rate 14, height 5' (1.524 m), weight 72.6 kg, SpO2 96 %. ?Physical Exam ?HENT:  ?   Mouth/Throat:  ?   Mouth: Mucous membranes are moist.  ?   Pharynx: Oropharynx is clear.  ?Eyes:  ?   General: No scleral icterus. ?   Conjunctiva/sclera: Conjunctivae normal.  ?Cardiovascular:  ?   Rate and Rhythm: Normal rate and regular rhythm.  ?   Heart sounds: Normal heart sounds. No murmur heard. ?Pulmonary:  ?   Effort: Pulmonary effort is normal.  ?   Breath sounds: Normal breath sounds.  ?Abdominal:  ?   General: There is no distension.  ?   Palpations: Abdomen is soft. There is no mass.  ?   Tenderness: There is no abdominal tenderness.  ?Musculoskeletal:     ?   General: No swelling.  ?   Cervical back: Neck supple.  ?Lymphadenopathy:  ?   Cervical: No cervical adenopathy.  ?Skin: ?   General: Skin is warm and dry.  ?Neurological:  ?   Mental Status: She is alert.  ?  ? ?Assessment/Plan ? ?Average risk screening colonoscopy. ?This is patient's first exam. ? ?Stacy Laser, MD ?01/18/2022, 8:02 AM ? ? ? ?

## 2022-01-19 LAB — SURGICAL PATHOLOGY

## 2022-01-23 ENCOUNTER — Encounter (HOSPITAL_COMMUNITY): Payer: Self-pay | Admitting: Internal Medicine

## 2022-02-05 ENCOUNTER — Encounter: Payer: Self-pay | Admitting: Urology

## 2022-02-05 ENCOUNTER — Ambulatory Visit: Payer: Medicare HMO | Admitting: Urology

## 2022-02-05 VITALS — BP 142/80 | HR 105

## 2022-02-05 DIAGNOSIS — N281 Cyst of kidney, acquired: Secondary | ICD-10-CM | POA: Diagnosis not present

## 2022-02-05 DIAGNOSIS — N189 Chronic kidney disease, unspecified: Secondary | ICD-10-CM | POA: Diagnosis not present

## 2022-02-05 DIAGNOSIS — N3281 Overactive bladder: Secondary | ICD-10-CM | POA: Diagnosis not present

## 2022-02-05 LAB — URINALYSIS, ROUTINE W REFLEX MICROSCOPIC
Bilirubin, UA: NEGATIVE
Glucose, UA: NEGATIVE
Ketones, UA: NEGATIVE
Nitrite, UA: NEGATIVE
Protein,UA: NEGATIVE
RBC, UA: NEGATIVE
Specific Gravity, UA: 1.015 (ref 1.005–1.030)
Urobilinogen, Ur: 0.2 mg/dL (ref 0.2–1.0)
pH, UA: 7 (ref 5.0–7.5)

## 2022-02-05 LAB — MICROSCOPIC EXAMINATION
RBC, Urine: NONE SEEN /hpf (ref 0–2)
Renal Epithel, UA: NONE SEEN /hpf

## 2022-02-05 NOTE — Progress Notes (Signed)
? ?02/05/2022 ?10:00 AM  ? ?Stacy Jensen ?11-09-1955 ?[id number] ? ?Referring provider: Kathyrn Drown, MD ?[address] ?[address] ?[place],  [place] [address] ? ?No chief complaint on file. ? ? ?HPI: ? ?F/u -  ? ?1) lower urinary tract symptoms-patient with a history of frequency and urgency.  She has also had incontinence when she stands or gets home with sudden urgency x many years. Wears a pad. Worse after pituitary surgery in 2009, but no other NG risk. She tried oxybutynin but had bothersome dry mouth. She tried Myrbetriq also which didn't help and expensive. No GU surgery or pelvic surgery. No constipation. She takes probiotics. No gross hematuria.  ?  ?She gets "UTI" with symptoms of urine odor. She drinks a lot of fountain coke and pepsi - two 44 oz per day.  ?  ?2) chronic kidney disease-her last creatinine was 1.44 with a GFR of 40-47 January 2023.  She does have a history of type 2 diabetes weeks. Feb 2023 renal US benign - 1.9 cm right renal cyst.   ?  ?Today, she is seen for the above. She worked with [employer] at [place] with PT. Drinking less caffeine/soda. PVR was 14 ml. Urgency improved. Her renal US images reviewed and benign from Feb 2023.  ?  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  ? ? ?PMH: ?Past Medical History:  ?Diagnosis Date  ? Anemia   ? Arthritis   ? Cushing's syndrome ([place])   ? GERD (gastroesophageal reflux disease) 04/11/2020  ? Hypertension   ? Hypertriglyceridemia   ? Hypopituitarism ([place]) 09/29/2021  ? Had cushings syndrome with pituitary growth, then had pituitary excision   ? Osteoporosis   ? Pituitary Cushing syndrome ([place])   ? tumor removed  ? Sleep apnea 2007  ? ? ?Surgical History: ?Past Surgical History:  ?Procedure Laterality Date  ? COLONOSCOPY WITH PROPOFOL N/A 01/18/2022  ? Procedure: COLONOSCOPY WITH PROPOFOL;  Surgeon: Rogene Houston, MD;  [facility]: [facility];  Service: Endoscopy;  Laterality: N/A;  820  ? PITUITARY EXCISION    ?  POLYPECTOMY  01/18/2022  ? Procedure: POLYPECTOMY;  Surgeon: Rogene Houston, MD;  Location: [facility] SUITE;  Service: Endoscopy;;  ? TUBAL LIGATION    ? ? ?Home Medications:  ?Allergies as of 02/05/2022   ? ?   Reactions  ? Codeine Nausea And Vomiting  ? Statins Other (See Comments)  ? Body aches  ? ?  ? ?  ?Medication List  ?  ? ?  ? Accurate as of Feb 05, 2022 10:00 AM. If you have any questions, ask your nurse or doctor.  ?  ?  ? ?  ? ?acetaminophen 650 MG CR tablet ?Commonly known as: TYLENOL ?Take 650 mg by mouth every 8 (eight) hours as needed for pain. ?  ?albuterol 108 (90 Base) MCG/ACT inhaler ?Commonly known as: VENTOLIN HFA ?Inhale 2 puffs into the lungs every 6 (six) hours as needed for wheezing. ?  ?amLODipine 2.5 MG tablet ?Commonly known as: NORVASC ?Take 1 tablet (2.5 mg total) by mouth daily. ?  ?APPLE CIDER VINEGAR PO ?Take 30 mLs by mouth in the morning and at bedtime. ?  ?B-complex with vitamin C tablet ?Take 1 tablet by mouth every evening. ?  ?cetirizine 10 MG tablet ?Commonly known as: ZYRTEC ?Take 10 mg by mouth at bedtime. ?  ?CITRACAL +D3 PO ?Take 1 tablet by mouth See admin  instructions. Take 1 tablet by mouth twice daily on Sundays & Wednesdays. ?Take 1 tablet by mouth once daily on Mondays, Tuesdays, Thursdays, Fridays & Saturdays. ?  ?Coenzyme Q10 200 MG capsule ?Take 200 mg by mouth in the morning. ?  ?Colon Cleanse Caps ?Take 1 capsule by mouth every evening. ?  ?famotidine 20 MG tablet ?Commonly known as: PEPCID ?Take 20 mg by mouth in the morning. ?  ?Fish Oil Triple Strength 1400 MG Caps ?Take 1,400 mg by mouth in the morning. ?  ?Goodys Extra Strength [id number] MG Pack ?Generic drug: Aspirin-Acetaminophen-Caffeine ?Take 1 packet by mouth daily as needed (pain.). ?  ?hydrocortisone 10 MG tablet ?Commonly known as: CORTEF ?TAKE 2 TABLETS BY MOUTH ONCE DAILY WITH FOOD ?What changed:  ?how much to take ?how to take this ?when to take this ?additional instructions ?  ?levothyroxine 75  MCG tablet ?Commonly known as: SYNTHROID ?TAKE 1 TABLET BY MOUTH ONCE DAILY TAKE  5  DAYS  A  WEEK ?What changed:  ?how much to take ?how to take this ?when to take this ?additional instructions ?  ?lisinopril 10 MG tablet ?Commonly known as: ZESTRIL ?TAKE 1 TABLET BY MOUTH DAILY ?  ?Magnesium 200 MG Tabs ?Take 200 mg by mouth at bedtime. ?  ?multivitamin with minerals tablet ?Take 1 tablet by mouth in the morning. Centrum Silver for Women 50+ ?  ?Praluent 75 MG/ML Soaj ?Generic drug: Alirocumab ?Inject 75 mg into the skin every 14 (fourteen) days. ?  ?VITAMIN B-12 PO ?Take 1 tablet by mouth in the morning. ?  ? ?  ? ? ?Allergies:  ?Allergies  ?Allergen Reactions  ? Codeine Nausea And Vomiting  ? Statins Other (See Comments)  ?  Body aches  ? ? ?Family History: ?Family History  ?Problem Relation Age of Onset  ? Hypertension Mother   ? Hypertension Father   ? Heart attack Father   ? Colon cancer Neg Hx   ? ? ?Social History:  reports that she has never smoked. She has never used smokeless tobacco. She reports that she does not drink alcohol and does not use drugs. ? ? ?Physical Exam: ?BP (!) 142/80   Pulse (!) 105   ?Constitutional:  Alert and oriented, No acute distress. ?HEENT: [place] AT, moist mucus membranes.  Trachea midline, no masses. ?Cardiovascular: No clubbing, cyanosis, or edema. ?Respiratory: Normal respiratory effort, no increased work of breathing. ?GI: Abdomen is soft, nontender, nondistended, no abdominal masses ?GU: No CVA tenderness ?Skin: No rashes, bruises or suspicious lesions. ?Neurologic: Grossly intact, no focal deficits, moving all 4 extremities. ?Psychiatric: Normal mood and affect. ? ?Laboratory Data: ?Lab Results  ?Component Value Date  ? WBC 9.1 12/21/2010  ? HGB 16.7 (H) 07/08/2020  ? HCT 49.0 (H) 07/08/2020  ? MCV 89.5 12/21/2010  ? PLT 159 12/21/2010  ? ? ?Lab Results  ?Component Value Date  ? CREATININE 1.05 (H) 12/19/2021  ? ? ?No results found for: PSA ? ?No results found for:  TESTOSTERONE ? ?Lab Results  ?Component Value Date  ? HGBA1C 5.7 (H) 09/28/2021  ? ? ?Urinalysis ?   ?Component Value Date/Time  ? [place] YELLOW 02/26/2008 1429  ? APPEARANCEUR Clear 10/09/2021 1102  ? LABSPEC 1.015 02/26/2008 1640  ? PHURINE 5.5 02/26/2008 1640  ? GLUCOSEU Negative 10/09/2021 1102  ? HGBUR NEGATIVE 02/26/2008 1640  ? BILIRUBINUR Negative 10/09/2021 1102  ? KETONESUR NEGATIVE 02/26/2008 1640  ? PROTEINUR Negative 10/09/2021 1102  ? PROTEINUR NEGATIVE 02/26/2008 1640  ? UROBILINOGEN  0.2 02/26/2008 1640  ? NITRITE Negative 10/09/2021 1102  ? NITRITE NEGATIVE 02/26/2008 1640  ? LEUKOCYTESUR Trace (A) 10/09/2021 1102  ? ? ?Lab Results  ?Component Value Date  ? LABMICR 10.2 11/28/2021  ? WBCUA 0-5 10/09/2021  ? LABEPIT 0-10 10/09/2021  ? BACTERIA Few 10/09/2021  ? ? ?Pertinent Imaging: ?Reviewed -- ? ?Results for orders placed during the hospital encounter of 10/26/21 ? ?US RENAL ? ?Narrative ?CLINICAL DATA:  Renal insufficiency. ? ?EXAM: ?RENAL / URINARY TRACT ULTRASOUND COMPLETE ? ?COMPARISON:  [facility], 12/19/2010. ? ?FINDINGS: ?Right Kidney: ? ?Renal measurements: 8.8 x 4.0 x 4.3 cm = volume: 80 mL. Echogenicity ?within normal limits. No hydronephrosis ? ?Well-circumscribed anechoic lesions within the RIGHT kidney, each ?measuring up to 1.9 cm and consistent with a renal cysts. ? ?Left Kidney: ? ?Renal measurements: 9.9 x 5.6 x 4.0 cm = volume: 116 mL. ?Echogenicity within normal limits. No mass or hydronephrosis ?visualized. ? ?Bladder: ? ?Appears normal for degree of bladder distention. ? ?Other: ? ?Postvoid volume residual of 14 mL ? ?IMPRESSION: ?1. No hydronephrosis. ?2. Incidental two, 1.9 cm RIGHT renal cysts. ?3. Postvoid residual of 14 mL ? ? ?Electronically Signed ?By: Michaelle Birks M.D. ?On: 10/27/2021 15:47 ? ?No results found for this or any previous visit. ? ?No results found for this or any previous visit. ? ?No results found for this or any previous visit. ? ? ?Assessment & Plan:    ? ?1. Overactive bladder ?Symptoms improved. Continue surveillance PT/BT.  ?- Urinalysis, Routine w reflex microscopic ? ?2. CKD, rena;l cyst - check [place] in 1 year  ? ?No follow-ups on file. ? ?Festus Aloe,

## 2022-02-28 ENCOUNTER — Ambulatory Visit (INDEPENDENT_AMBULATORY_CARE_PROVIDER_SITE_OTHER): Payer: Medicare HMO | Admitting: Physician Assistant

## 2022-02-28 VITALS — BP 168/85 | HR 139 | Ht 60.5 in | Wt 160.0 lb

## 2022-02-28 DIAGNOSIS — R351 Nocturia: Secondary | ICD-10-CM | POA: Diagnosis not present

## 2022-02-28 DIAGNOSIS — N390 Urinary tract infection, site not specified: Secondary | ICD-10-CM | POA: Diagnosis not present

## 2022-02-28 LAB — URINALYSIS, ROUTINE W REFLEX MICROSCOPIC
Bilirubin, UA: NEGATIVE
Glucose, UA: NEGATIVE
Nitrite, UA: POSITIVE — AB
Specific Gravity, UA: 1.015 (ref 1.005–1.030)
Urobilinogen, Ur: 0.2 mg/dL (ref 0.2–1.0)
pH, UA: 8.5 — ABNORMAL HIGH (ref 5.0–7.5)

## 2022-02-28 LAB — MICROSCOPIC EXAMINATION
Renal Epithel, UA: NONE SEEN /hpf
WBC, UA: >30 /hpf — AB (ref 0–5)

## 2022-02-28 MED ORDER — NITROFURANTOIN MONOHYD MACRO 100 MG PO CAPS: 100.0000 mg | ORAL_CAPSULE | Freq: Two times a day (BID) | ORAL | 0 refills | Status: AC

## 2022-02-28 NOTE — Progress Notes (Signed)
Assessment: 1. Urinary tract infection without hematuria, site unspecified - Urinalysis, Routine w reflex microscopic - Urine Culture  2. Nocturia    Plan: Macrobid prescribed and culture ordered.  We will change treatment if indicated pending results.  She will continue PT/BT and if she develops symptoms or nocturia persist, she will follow-up.  Otherwise she will keep her yearly follow-up visit with Dr. Junious Silk.  Chief Complaint: No chief complaint on file.   HPI: Stacy Jensen is a 66 y.o. female who presents for evaluation of possible UTI as she had increased nocturia 2 nights ago with a positive dipstick test at home yesterday.  Patient's history is significant for ongoing incontinence, OAB, UTIs without symptoms.  She continues to wear pads at all times.   No pain, dysuria, hematuria, burning. UA = nitrite positive with 3+ leukoesterase.  Microscopic exam pending  02/05/22 1) lower urinary tract symptoms-patient with a history of frequency and urgency.  She has also had incontinence when she stands or gets home with sudden urgency x many years. Wears a pad. Worse after pituitary surgery in 2009, but no other NG risk. She tried oxybutynin but had bothersome dry mouth. She tried Myrbetriq also which didn't help and expensive. No GU surgery or pelvic surgery. No constipation. She takes probiotics. No gross hematuria.    She gets "UTI" with symptoms of urine odor. She drinks a lot of fountain coke and pepsi - two 44 oz per day.    2) chronic kidney disease-her last creatinine was 1.44 with a GFR of 40-47 January 2023.  She does have a history of type 2 diabetes weeks. Feb 2023 renal US benign - 1.9 cm right renal cyst.     Today, she is seen for the above. She worked with [employer] at [place] with PT. Drinking less caffeine/soda. PVR was 14 ml. Urgency improved. Her renal US images reviewed and benign from Feb 2023.    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    Portions of the above documentation were copied from a prior visit for review purposes only.  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   Had cushings syndrome with pituitary growth, then had pituitary excision    Osteoporosis    Pituitary Cushing syndrome ([place])    tumor removed   Sleep apnea 2007    PSH: Past Surgical History:  Procedure Laterality Date   COLONOSCOPY WITH PROPOFOL N/A 01/18/2022   Procedure: COLONOSCOPY WITH PROPOFOL;  Surgeon: Rogene Houston, MD;  [facility];  Service: Endoscopy;  Laterality: N/A;  820   PITUITARY EXCISION     POLYPECTOMY  01/18/2022   Procedure: POLYPECTOMY;  Surgeon: Rogene Houston, MD;  Location: [facility];  Service: Endoscopy;;   TUBAL LIGATION      SH: Social History   Tobacco Use   Smoking status: Never   Smokeless tobacco: Never  Vaping Use   Vaping Use: Never used  Substance Use Topics   Alcohol use: Never   Drug use: Never    ROS: See HPI  PE: BP (!) 168/85  Pulse (!) 139   Ht 5' 0.5" (1.537 m)   Wt 160 lb (72.6 kg)   BMI 30.73 kg/m  GENERAL APPEARANCE:  Well appearing, well developed, well nourished, NAD HEENT:  Atraumatic, normocephalic NECK:  Supple. Trachea midline ABDOMEN:  Soft, non-tender, no masses EXTREMITIES:  Moves all extremities well, without clubbing, cyanosis, or edema NEUROLOGIC:  Alert and oriented x 3, normal gait, CN II-XII grossly intact MENTAL STATUS:  appropriate BACK:  Non-tender to palpation, No CVAT SKIN:  Warm, dry, and intact   Results: Laboratory Data: Lab Results  Component Value Date   WBC 9.1 12/21/2010   HGB 16.7 (H) 07/08/2020    HCT 49.0 (H) 07/08/2020   MCV 89.5 12/21/2010   PLT 159 12/21/2010    Lab Results  Component Value Date   CREATININE 1.05 (H) 12/19/2021    Lab Results  Component Value Date   HGBA1C 5.7 (H) 09/28/2021    Urinalysis    Component Value Date/Time   COLORURINE YELLOW 02/26/2008 1429   APPEARANCEUR Clear 02/05/2022 1031   LABSPEC 1.015 02/26/2008 1640   PHURINE 5.5 02/26/2008 1640   GLUCOSEU Negative 02/05/2022 1031   HGBUR NEGATIVE 02/26/2008 1640   BILIRUBINUR Negative 02/05/2022 1031   KETONESUR NEGATIVE 02/26/2008 1640   PROTEINUR Negative 02/05/2022 1031   PROTEINUR NEGATIVE 02/26/2008 1640   UROBILINOGEN 0.2 02/26/2008 1640   NITRITE Negative 02/05/2022 1031   NITRITE NEGATIVE 02/26/2008 1640   LEUKOCYTESUR 1+ (A) 02/05/2022 1031    Lab Results  Component Value Date   LABMICR See below: 02/05/2022   WBCUA 11-30 (A) 02/05/2022   LABEPIT 0-10 02/05/2022   BACTERIA Few (A) 02/05/2022    Pertinent Imaging:  No results found for this or any previous visit.  No results found for this or any previous visit.  No results found for this or any previous visit.  No results found for this or any previous visit.  Results for orders placed during the hospital encounter of 10/26/21  US RENAL  Narrative CLINICAL DATA:  Renal insufficiency.  EXAM: RENAL / URINARY TRACT ULTRASOUND COMPLETE  COMPARISON:  CT [facility], 12/19/2010.  FINDINGS: Right Kidney:  Renal measurements: 8.8 x 4.0 x 4.3 cm = volume: 80 mL. Echogenicity within normal limits. No hydronephrosis  Well-circumscribed anechoic lesions within the RIGHT kidney, each measuring up to 1.9 cm and consistent with a renal cysts.  Left Kidney:  Renal measurements: 9.9 x 5.6 x 4.0 cm = volume: 116 mL. Echogenicity within normal limits. No mass or hydronephrosis visualized.  Bladder:  Appears normal for degree of bladder distention.  Other:  Postvoid volume residual of 14 mL  IMPRESSION: 1. No  hydronephrosis. 2. Incidental two, 1.9 cm RIGHT renal cysts. 3. Postvoid residual of 14 mL   Electronically Signed By: Michaelle Birks M.D. On: 10/27/2021 15:47  No results found for this or any previous visit.  No results found for this or any previous visit.  No results found for this or any previous visit.  No results found for this or any previous visit (from the past 24 hour(s)).

## 2022-03-02 LAB — URINE CULTURE

## 2022-05-07 ENCOUNTER — Ambulatory Visit: Payer: Self-pay | Admitting: Family Medicine

## 2022-05-15 DIAGNOSIS — E039 Hypothyroidism, unspecified: Secondary | ICD-10-CM | POA: Diagnosis not present

## 2022-05-15 DIAGNOSIS — R7303 Prediabetes: Secondary | ICD-10-CM | POA: Diagnosis not present

## 2022-05-15 DIAGNOSIS — N289 Disorder of kidney and ureter, unspecified: Secondary | ICD-10-CM | POA: Diagnosis not present

## 2022-05-15 DIAGNOSIS — E249 Cushing's syndrome, unspecified: Secondary | ICD-10-CM | POA: Diagnosis not present

## 2022-05-15 DIAGNOSIS — R5383 Other fatigue: Secondary | ICD-10-CM | POA: Diagnosis not present

## 2022-05-15 DIAGNOSIS — E7849 Other hyperlipidemia: Secondary | ICD-10-CM | POA: Diagnosis not present

## 2022-05-16 ENCOUNTER — Ambulatory Visit (INDEPENDENT_AMBULATORY_CARE_PROVIDER_SITE_OTHER): Payer: Medicare HMO | Admitting: Family Medicine

## 2022-05-16 VITALS — BP 122/63 | HR 99 | Temp 97.9°F | Ht 60.5 in | Wt 158.0 lb

## 2022-05-16 DIAGNOSIS — D582 Other hemoglobinopathies: Secondary | ICD-10-CM

## 2022-05-16 DIAGNOSIS — E039 Hypothyroidism, unspecified: Secondary | ICD-10-CM | POA: Diagnosis not present

## 2022-05-16 DIAGNOSIS — N289 Disorder of kidney and ureter, unspecified: Secondary | ICD-10-CM

## 2022-05-16 DIAGNOSIS — E7849 Other hyperlipidemia: Secondary | ICD-10-CM

## 2022-05-16 DIAGNOSIS — I10 Essential (primary) hypertension: Secondary | ICD-10-CM | POA: Diagnosis not present

## 2022-05-16 LAB — CBC WITH DIFFERENTIAL/PLATELET
Basophils Absolute: 0.1 10*3/uL (ref 0.0–0.2)
Basos: 1 %
EOS (ABSOLUTE): 0.1 10*3/uL (ref 0.0–0.4)
Eos: 1 %
Hematocrit: 52.2 % — ABNORMAL HIGH (ref 34.0–46.6)
Hemoglobin: 17.2 g/dL — ABNORMAL HIGH (ref 11.1–15.9)
Immature Grans (Abs): 0 10*3/uL (ref 0.0–0.1)
Immature Granulocytes: 0 %
Lymphocytes Absolute: 3.1 10*3/uL (ref 0.7–3.1)
Lymphs: 34 %
MCH: 31.1 pg (ref 26.6–33.0)
MCHC: 33 g/dL (ref 31.5–35.7)
MCV: 94 fL (ref 79–97)
Monocytes Absolute: 0.5 10*3/uL (ref 0.1–0.9)
Monocytes: 6 %
Neutrophils Absolute: 5.3 10*3/uL (ref 1.4–7.0)
Neutrophils: 58 %
Platelets: 255 10*3/uL (ref 150–450)
RBC: 5.53 x10E6/uL — ABNORMAL HIGH (ref 3.77–5.28)
RDW: 13.7 % (ref 11.7–15.4)
WBC: 9.1 10*3/uL (ref 3.4–10.8)

## 2022-05-16 LAB — BASIC METABOLIC PANEL
BUN/Creatinine Ratio: 9 — ABNORMAL LOW (ref 12–28)
BUN: 12 mg/dL (ref 8–27)
CO2: 22 mmol/L (ref 20–29)
Calcium: 9.9 mg/dL (ref 8.7–10.3)
Chloride: 104 mmol/L (ref 96–106)
Creatinine, Ser: 1.41 mg/dL — ABNORMAL HIGH (ref 0.57–1.00)
Glucose: 107 mg/dL — ABNORMAL HIGH (ref 70–99)
Potassium: 4.1 mmol/L (ref 3.5–5.2)
Sodium: 146 mmol/L — ABNORMAL HIGH (ref 134–144)
eGFR: 41 mL/min/{1.73_m2} — ABNORMAL LOW (ref >59)

## 2022-05-16 LAB — HEMOGLOBIN A1C
Est. average glucose Bld gHb Est-mCnc: 114 mg/dL
Hgb A1c MFr Bld: 5.6 % (ref 4.8–5.6)

## 2022-05-16 LAB — LIPID PANEL
Chol/HDL Ratio: 4.3 ratio (ref 0.0–4.4)
Cholesterol, Total: 235 mg/dL — ABNORMAL HIGH (ref 100–199)
HDL: 55 mg/dL (ref >39)
LDL Chol Calc (NIH): 131 mg/dL — ABNORMAL HIGH (ref 0–99)
Triglycerides: 277 mg/dL — ABNORMAL HIGH (ref 0–149)
VLDL Cholesterol Cal: 49 mg/dL — ABNORMAL HIGH (ref 5–40)

## 2022-05-16 LAB — CORTISOL: Cortisol: 17 ug/dL (ref 6.2–19.4)

## 2022-05-16 LAB — TSH: TSH: 1.29 u[IU]/mL (ref 0.450–4.500)

## 2022-05-16 MED ORDER — AMLODIPINE BESYLATE 5 MG PO TABS: 5.0000 mg | ORAL_TABLET | Freq: Every day | ORAL | 5 refills | Status: DC

## 2022-05-16 NOTE — Progress Notes (Addendum)
   Subjective:    Patient ID: Stacy Jensen, female    DOB: 12/07/1955, 66 y.o.   MRN: [id number]  Hypertension This is a chronic problem. Treatments tried: amlodipine, lisinopril.   Has home readings  Blood pressure readings some of them are quite elevated compared to where they should be other top ones look better  Essential hypertension, benign - Plan: Microalbumin/Creatinine Ratio, Urine, Basic metabolic panel, CBC with Differential/Platelet  Hypothyroidism, unspecified type - Plan: Microalbumin/Creatinine Ratio, Urine, Basic metabolic panel, CBC with Differential/Platelet  Other hyperlipidemia - Plan: Microalbumin/Creatinine Ratio, Urine, Basic metabolic panel, CBC with Differential/Platelet  Elevated hemoglobin (HCC) - Plan: Microalbumin/Creatinine Ratio, Urine, Basic metabolic panel, CBC with Differential/Platelet  Renal insufficiency - Plan: Microalbumin/Creatinine Ratio, Urine, Basic metabolic panel, CBC with Differential/Platelet Patient relates that she had vertigo with nausea and not eating or drinking well the past several days and had a blood work done yesterday denies unilateral numbness weakness Review of Systems     Objective:   Physical Exam General-in no acute distress Eyes-no discharge Lungs-respiratory rate normal, CTA CV-no murmurs,RRR Extremities skin warm dry no edema Neuro grossly normal Behavior normal, alert A1c reasonable at 5.6 CBC shows hemoglobin as elevated Patient's creatinine elevated over baseline Course resolved thyroid looks good LDL mildly elevated    Assessment & Plan:  1. Essential hypertension, benign Blood pressure to go with 5 mg daily to keep blood pressure under good control, several readings were higher at home than what we would like to see - Microalbumin/Creatinine Ratio, Urine - Basic metabolic panel - CBC with Differential/Platelet  2. Hypothyroidism, unspecified type Continue thyroid medication - Microalbumin/Creatinine  Ratio, Urine - Basic metabolic panel - CBC with Differential/Platelet  3. Other hyperlipidemia Healthy diet minimize fats in the diet stay active - Microalbumin/Creatinine Ratio, Urine - Basic metabolic panel - CBC with Differential/Platelet  4. Elevated hemoglobin (HCC) Recheck hemoglobin as possible patient was mildly dehydrated at the time of this - Microalbumin/Creatinine Ratio, Urine - Basic metabolic panel - CBC with Differential/Platelet  5. Renal insufficiency Kidney function possible patient was dehydrated during her last lab work - Microalbumin/Creatinine Ratio, Urine - Basic metabolic panel - CBC with Differential/Platelet  Vertigo resolving Patient currently on Praluent continue this for cholesterol

## 2022-05-19 ENCOUNTER — Other Ambulatory Visit: Payer: Self-pay | Admitting: Family Medicine

## 2022-05-24 DIAGNOSIS — Z1231 Encounter for screening mammogram for malignant neoplasm of breast: Secondary | ICD-10-CM | POA: Diagnosis not present

## 2022-05-24 LAB — HM MAMMOGRAPHY: HM Mammogram: NORMAL (ref 0–4)

## 2022-05-30 DIAGNOSIS — I10 Essential (primary) hypertension: Secondary | ICD-10-CM | POA: Diagnosis not present

## 2022-05-30 DIAGNOSIS — E7849 Other hyperlipidemia: Secondary | ICD-10-CM | POA: Diagnosis not present

## 2022-05-30 DIAGNOSIS — E039 Hypothyroidism, unspecified: Secondary | ICD-10-CM | POA: Diagnosis not present

## 2022-05-30 DIAGNOSIS — D582 Other hemoglobinopathies: Secondary | ICD-10-CM | POA: Diagnosis not present

## 2022-05-30 DIAGNOSIS — N289 Disorder of kidney and ureter, unspecified: Secondary | ICD-10-CM | POA: Diagnosis not present

## 2022-05-31 LAB — MICROALBUMIN / CREATININE URINE RATIO
Creatinine, Urine: 122.3 mg/dL
Microalb/Creat Ratio: 11 mg/g creat (ref 0–29)
Microalbumin, Urine: 13.7 ug/mL

## 2022-05-31 LAB — CBC WITH DIFFERENTIAL/PLATELET
Basophils Absolute: 0.1 10*3/uL (ref 0.0–0.2)
Basos: 1 %
EOS (ABSOLUTE): 0.1 10*3/uL (ref 0.0–0.4)
Eos: 2 %
Hematocrit: 48 % — ABNORMAL HIGH (ref 34.0–46.6)
Hemoglobin: 16 g/dL — ABNORMAL HIGH (ref 11.1–15.9)
Immature Grans (Abs): 0.1 10*3/uL (ref 0.0–0.1)
Immature Granulocytes: 1 %
Lymphocytes Absolute: 2.5 10*3/uL (ref 0.7–3.1)
Lymphs: 28 %
MCH: 31.7 pg (ref 26.6–33.0)
MCHC: 33.3 g/dL (ref 31.5–35.7)
MCV: 95 fL (ref 79–97)
Monocytes Absolute: 0.5 10*3/uL (ref 0.1–0.9)
Monocytes: 6 %
Neutrophils Absolute: 5.5 10*3/uL (ref 1.4–7.0)
Neutrophils: 62 %
Platelets: 227 10*3/uL (ref 150–450)
RBC: 5.04 x10E6/uL (ref 3.77–5.28)
RDW: 13.6 % (ref 11.7–15.4)
WBC: 8.7 10*3/uL (ref 3.4–10.8)

## 2022-05-31 LAB — BASIC METABOLIC PANEL
BUN/Creatinine Ratio: 11 — ABNORMAL LOW (ref 12–28)
BUN: 11 mg/dL (ref 8–27)
CO2: 17 mmol/L — ABNORMAL LOW (ref 20–29)
Calcium: 9.9 mg/dL (ref 8.7–10.3)
Chloride: 105 mmol/L (ref 96–106)
Creatinine, Ser: 1.02 mg/dL — ABNORMAL HIGH (ref 0.57–1.00)
Glucose: 135 mg/dL — ABNORMAL HIGH (ref 70–99)
Potassium: 4.5 mmol/L (ref 3.5–5.2)
Sodium: 144 mmol/L (ref 134–144)
eGFR: 60 mL/min/{1.73_m2} (ref >59)

## 2022-06-01 ENCOUNTER — Encounter: Payer: Self-pay | Admitting: Family Medicine

## 2022-06-12 ENCOUNTER — Ambulatory Visit (INDEPENDENT_AMBULATORY_CARE_PROVIDER_SITE_OTHER): Payer: Medicare HMO | Admitting: Family Medicine

## 2022-06-12 VITALS — BP 138/88 | HR 88 | Temp 97.3°F | Ht 60.5 in | Wt 160.0 lb

## 2022-06-12 DIAGNOSIS — I10 Essential (primary) hypertension: Secondary | ICD-10-CM

## 2022-06-12 DIAGNOSIS — Z23 Encounter for immunization: Secondary | ICD-10-CM | POA: Diagnosis not present

## 2022-06-12 NOTE — Patient Instructions (Signed)

## 2022-06-12 NOTE — Progress Notes (Signed)
   Subjective:    Patient ID: Elroy Channel, female    DOB: 1956-07-10, 66 y.o.   MRN: [id number]  HPI 4 week follow up for hpertension, patient is not currently taking amlodipine or lisinopril- swelling in her ankles has subsided  Patient relates that she felt bad rundown.  Was concerned that something was going on with her medicines.  Felt fatigued a lot.  She stopped taking the blood pressure medicine.  She states she feels fine and her swelling in her ankles went down  Review of Systems     Objective:   Physical Exam General-in no acute distress Eyes-no discharge Lungs-respiratory rate normal, CTA CV-no murmurs,RRR Extremities skin warm dry no edema Neuro grossly normal Behavior normal, alert  Right arm blood pressure checked several times best reading 138/88      Assessment & Plan:  HTN-subpar control but within reason Healthy diet minimize eating out minimize salt may stay off medicines currently check blood pressure on a regular basis send [place] some readings within 6 weeks Follow-up within 4 months May have to be back on blood pressure medicine at some point

## 2022-07-01 IMAGING — US US RENAL
1 series · 14 of 25 positions shown · non-contrast
Comparison: CT [facility], 12/19/2010.

CLINICAL DATA: Renal insufficiency.

EXAM:
RENAL / URINARY TRACT ULTRASOUND COMPLETE

[Series 1: us renal · 0.19mm/px · 14 of 44 slices shown]
[im 1/44]
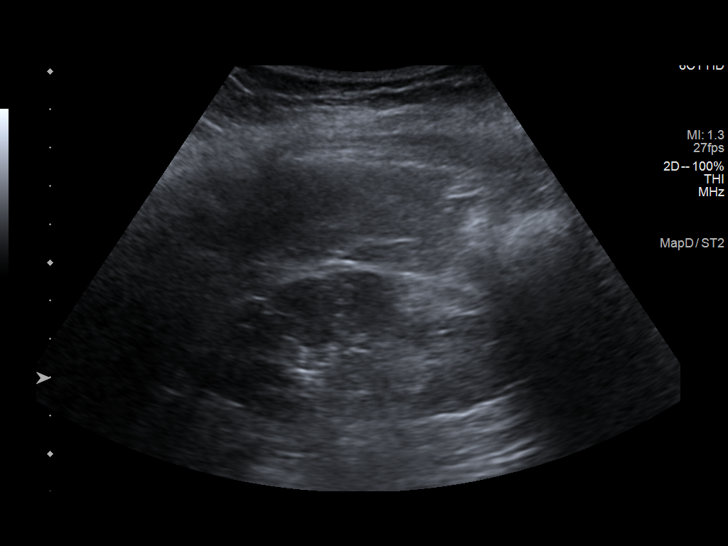
[im 4/44]
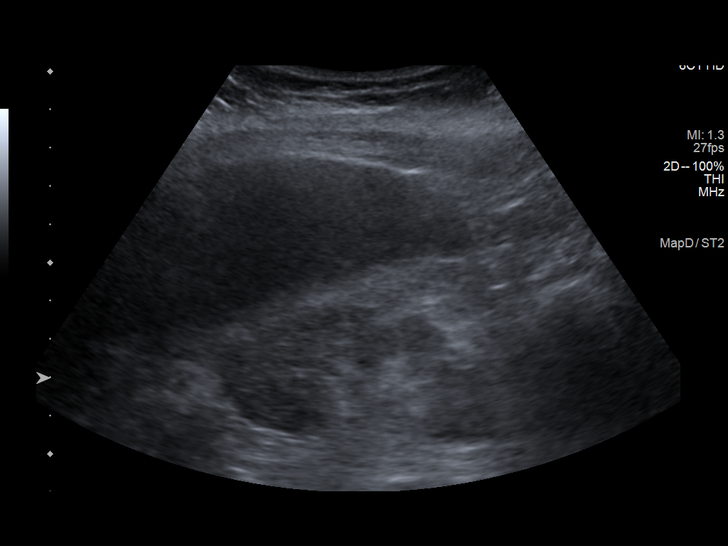
[im 8/44]
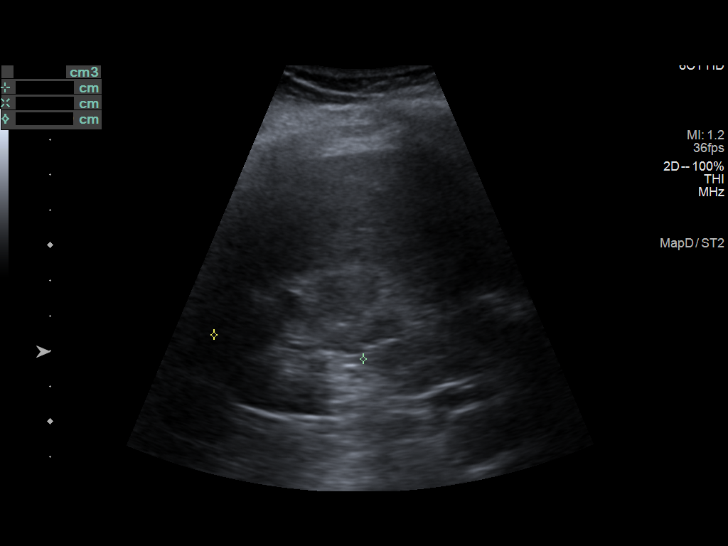
[im 11/44]
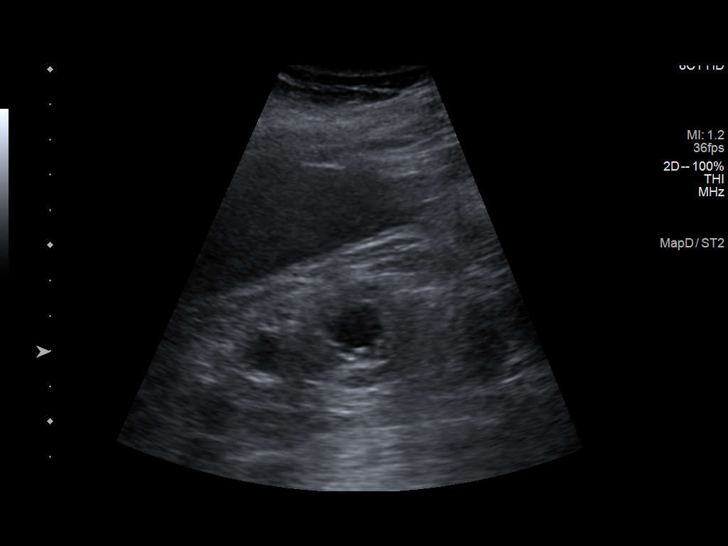
[im 15/44]
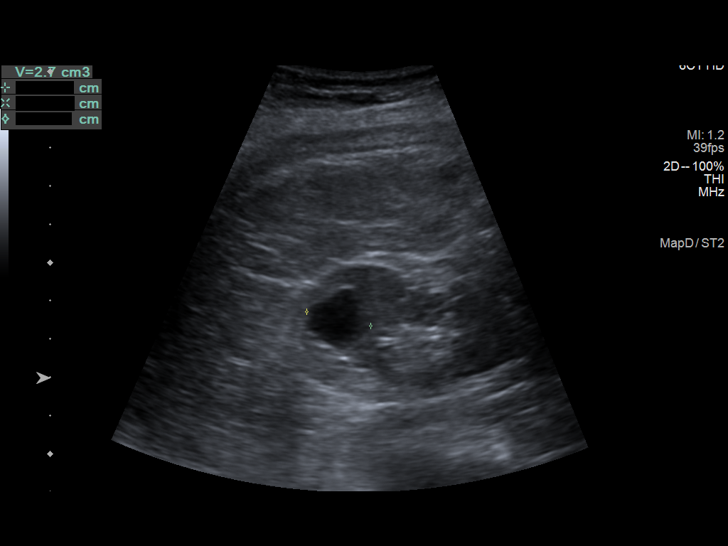
[im 17/44]
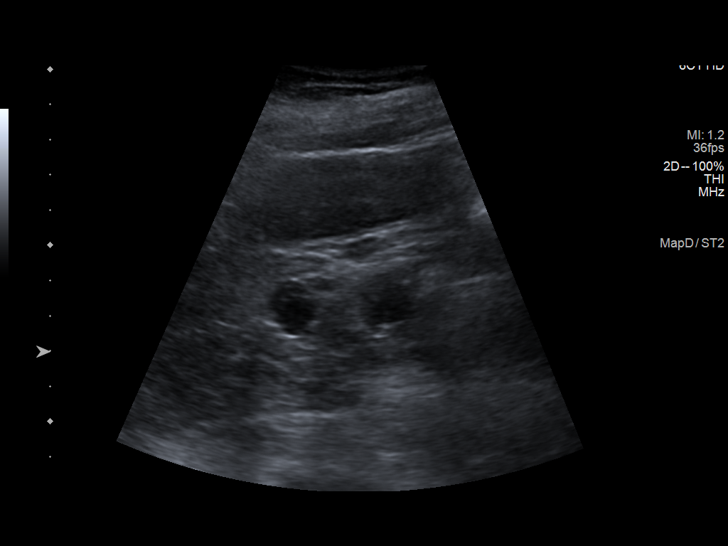
[im 20/44]
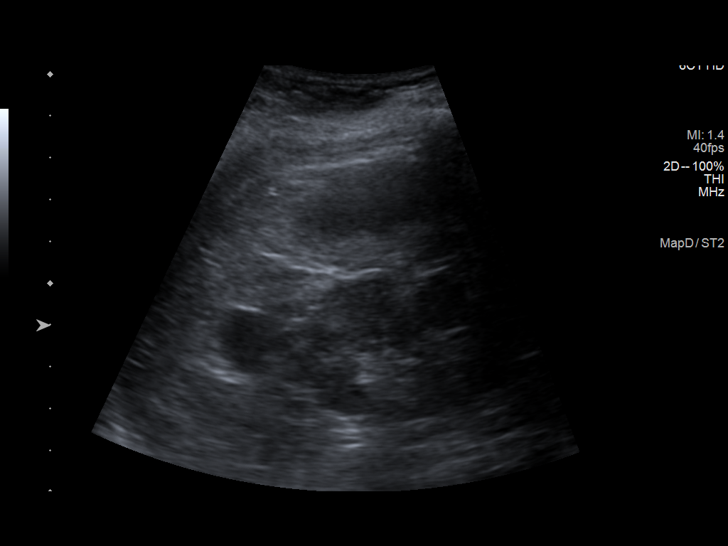
[im 24/44]
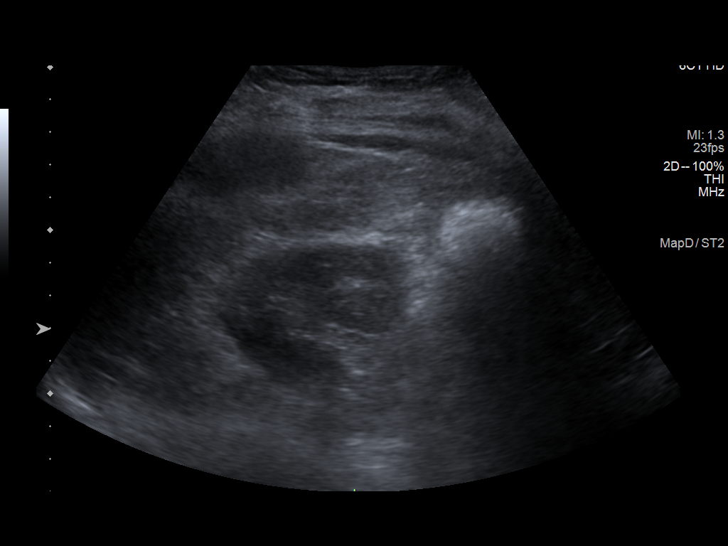
[im 27/44]
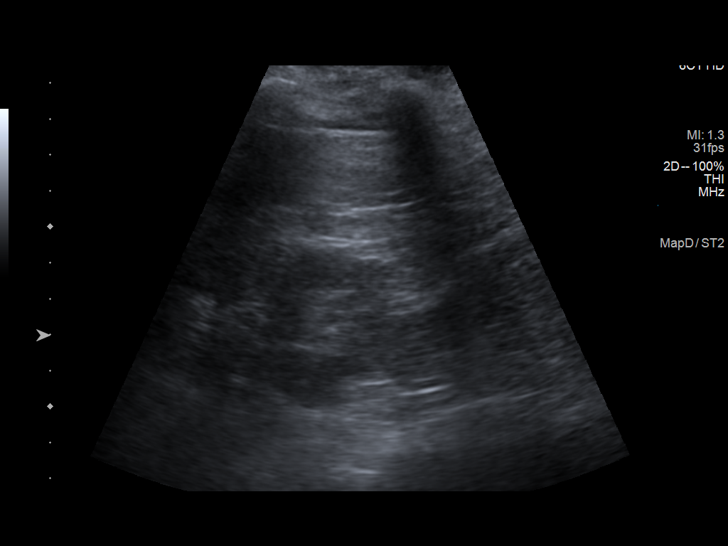
[im 29/44]
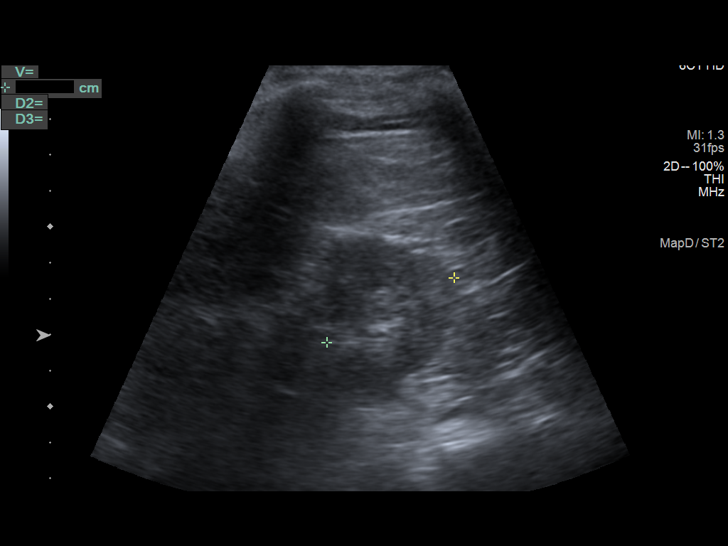
[im 33/44]
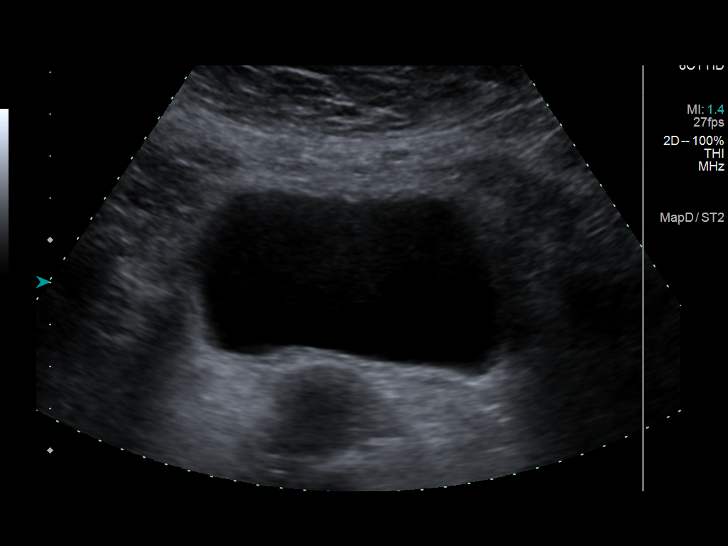
[im 36/44]
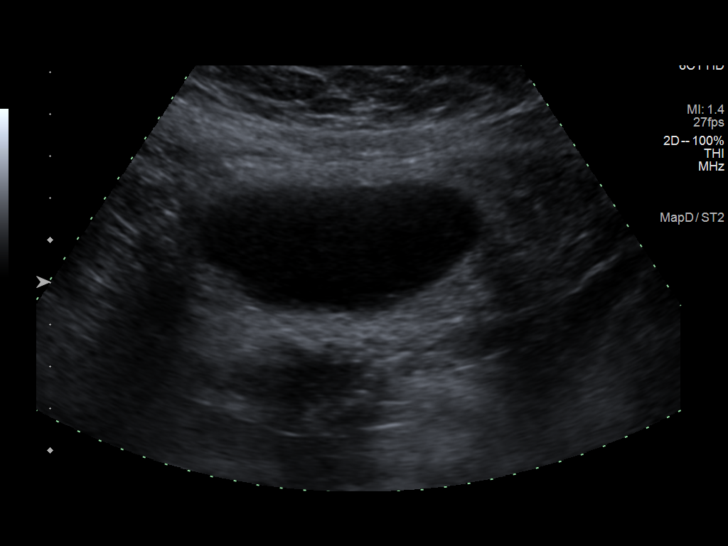
[im 40/44]
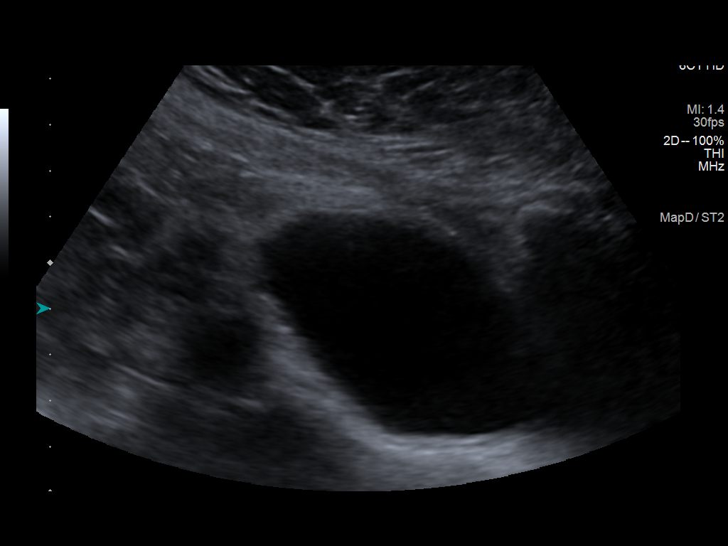
[im 44/44]
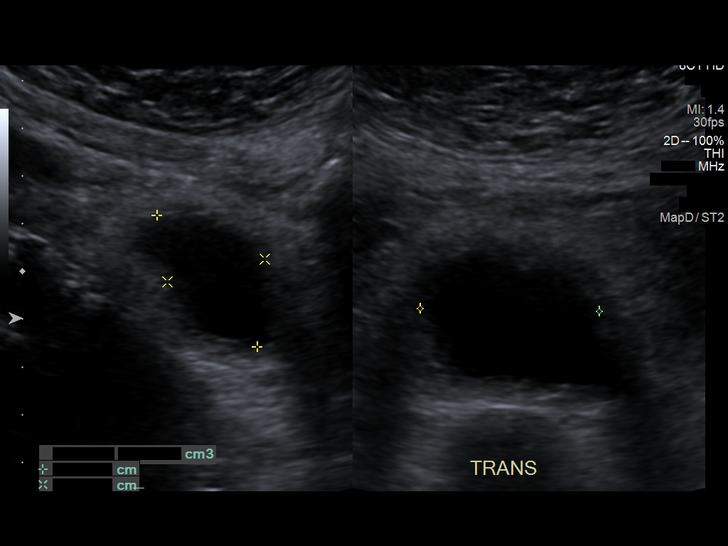

[14 of 25 positions shown; findings below may reference images not displayed]

FINDINGS: Right Kidney:

Renal measurements: 8.8 x 4.0 x 4.3 cm = volume: 80 mL. Echogenicity
within normal limits. No hydronephrosis

Well-circumscribed anechoic lesions within the RIGHT kidney, each
measuring up to 1.9 cm and consistent with a renal cysts.

Left Kidney:

Renal measurements: 9.9 x 5.6 x 4.0 cm = volume: 116 mL.
Echogenicity within normal limits. No mass or hydronephrosis
visualized.

Bladder:

Appears normal for degree of bladder distention.

Other:

Postvoid volume residual of 14 mL
IMPRESSION: 1. No hydronephrosis.
2. Incidental two, 1.9 cm RIGHT renal cysts.
3. Postvoid residual of 14 mL

## 2022-07-06 ENCOUNTER — Other Ambulatory Visit: Payer: Self-pay | Admitting: Family Medicine

## 2022-07-30 ENCOUNTER — Ambulatory Visit (INDEPENDENT_AMBULATORY_CARE_PROVIDER_SITE_OTHER): Payer: Medicare HMO | Admitting: Physician Assistant

## 2022-07-30 VITALS — BP 184/114 | HR 97 | Wt 158.0 lb

## 2022-07-30 DIAGNOSIS — M545 Low back pain, unspecified: Secondary | ICD-10-CM | POA: Diagnosis not present

## 2022-07-30 DIAGNOSIS — I10 Essential (primary) hypertension: Secondary | ICD-10-CM

## 2022-07-30 DIAGNOSIS — Z87440 Personal history of urinary (tract) infections: Secondary | ICD-10-CM

## 2022-07-30 DIAGNOSIS — N390 Urinary tract infection, site not specified: Secondary | ICD-10-CM | POA: Insufficient documentation

## 2022-07-30 LAB — POCT URINALYSIS DIPSTICK
Bilirubin, UA: NEGATIVE
Glucose, UA: NEGATIVE
Ketones, UA: NEGATIVE
Nitrite, UA: NEGATIVE
Protein, UA: POSITIVE — AB
Spec Grav, UA: 1.01 (ref 1.010–1.025)
Urobilinogen, UA: 0.2 E.U./dL
pH, UA: 5.5 (ref 5.0–8.0)

## 2022-07-30 LAB — BLADDER SCAN AMB NON-IMAGING: Scan Result: 26

## 2022-07-30 MED ORDER — NITROFURANTOIN MONOHYD MACRO 100 MG PO CAPS: 100.0000 mg | ORAL_CAPSULE | Freq: Two times a day (BID) | ORAL | 0 refills | Status: AC

## 2022-07-30 NOTE — Progress Notes (Signed)
Assessment: 1. Low back pain without sciatica, unspecified back pain laterality, unspecified chronicity - POCT urinalysis dipstick - BLADDER SCAN AMB NON-IMAGING - Urine Culture  2. History of UTI    Plan: Urine sent for culture.  Discussed antibiotic stewardship and likelihood that her symptoms are more musculoskeletal or from riding in the car for an extended period of time.  She is advised to use over-the-counter analgesics as discussed.  Patient is adamant that her UTIs present in this fashion and requests antibiotics.  Macrobid sent to her pharmacy and request that she await urine culture before beginning.  She will continue increasing fluid intake and keep previously scheduled follow-up.  Meds ordered this encounter  Medications   nitrofurantoin, macrocrystal-monohydrate, (MACROBID) 100 MG capsule    Sig: Take 1 capsule (100 mg total) by mouth 2 (two) times daily for 7 days.    Dispense:  14 capsule    Refill:  0     Chief Complaint: No chief complaint on file.   HPI: Stacy Jensen is a 66 y.o. female who presents for evaluation of 2 day h/o lower abdominal and LBP since a trip in the car lasting an hour one way. Pt denies change in frequency, urgency, or OAB sxs, and denies gross hematuria, fever chills, NV. She took a home test and says it showed infection.  Patient denies UTIs since her last visit here in June. Most recent urine culture in June 2023 was positive for 100 K colonies of E. coli, pansensitive.  Patient was treated with Macrobid. UA=POC dip 1+leukoesterace, nitrite neg PVR=20m  02/28/22 Stacy TUMAis a 66y.o. female who presents for evaluation of possible UTI as she had increased nocturia 2 nights ago with a positive dipstick test at home yesterday.  Patient's history is significant for ongoing incontinence, OAB, UTIs without symptoms.  She continues to wear pads at all times.   No pain, dysuria, hematuria, burning. UA = nitrite positive with 3+  leukoesterase.  Microscopic exam pending   02/05/22 1) lower urinary tract symptoms-patient with a history of frequency and urgency.  She has also had incontinence when she stands or gets home with sudden urgency x many years. Wears a pad. Worse after pituitary surgery in 2009, but no other NG risk. She tried oxybutynin but had bothersome dry mouth. She tried Myrbetriq also which didn't help and expensive. No GU surgery or pelvic surgery. No constipation. She takes probiotics. No gross hematuria.    She gets "UTI" with symptoms of urine odor. She drinks a lot of fountain coke and pepsi - two 44 oz per day.    2) chronic kidney disease-her last creatinine was 1.44 with a GFR of 40-47 January 2023.  She does have a history of type 2 diabetes weeks. Feb 2023 renal [place]ign - 1.9 cm right renal cyst.     Today, she is seen for the above. [place] Drinking less caffeine/soda. PVR was 14 ml. Urgency improved. Her renal [place]ages reviewed and benign from Feb 2023.    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   Portions of the above documentation were copied from a prior visit for review purposes only.  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/29/2021   Had cushings syndrome with pituitary growth, then had pituitary excision    Osteoporosis    Pituitary Cushing syndrome ([place])    tumor removed   Sleep apnea 2007    PSH: Past Surgical History:  Procedure Laterality Date   COLONOSCOPY WITH PROPOFOL N/A 01/18/2022   Procedure: COLONOSCOPY WITH PROPOFOL;  Surgeon: Rogene Houston, MD;  [facility];  Service: Endoscopy;   Laterality: N/A;  820   PITUITARY EXCISION     POLYPECTOMY  01/18/2022   Procedure: POLYPECTOMY;  Surgeon: Rogene Houston, MD;  [facility];  Service: Endoscopy;;   TUBAL LIGATION      SH: Social History   Tobacco Use   Smoking status: Never   Smokeless tobacco: Never  Vaping Use   Vaping Use: Never used  Substance Use Topics   Alcohol use: Never   Drug use: Never    ROS: All other review of systems were reviewed and are negative except what is noted above in HPI  PE: BP (!) 184/114   Pulse 97   Wt 158 lb (71.7 kg)   BMI 30.35 kg/m  GENERAL APPEARANCE:  Well appearing, well developed, well nourished, NAD HEENT:  Atraumatic, normocephalic NECK:  Supple. Trachea midline ABDOMEN:  Soft, non-tender, no masses EXTREMITIES:  Moves all extremities well, without clubbing, cyanosis, or edema NEUROLOGIC:  Alert and oriented x 3, normal gait, CN II-XII grossly intact MENTAL STATUS:  appropriate BACK:  Non-tender to palpation, No CVAT SKIN:  Warm, dry, and intact   Results: Laboratory Data: Lab Results  Component Value Date   WBC 8.7 05/30/2022   HGB 16.0 (H) 05/30/2022   HCT 48.0 (H) 05/30/2022   MCV 95 05/30/2022   PLT 227 05/30/2022    Lab Results  Component Value Date   CREATININE 1.02 (H) 05/30/2022    No results found for: "PSA"  No results found for: "TESTOSTERONE"  Lab Results  Component Value Date   HGBA1C 5.6 05/15/2022    Urinalysis    Component Value Date/Time   COLORURINE YELLOW 02/26/2008 1429   APPEARANCEUR Cloudy (A) 02/28/2022 1343   LABSPEC 1.015 02/26/2008 1640   PHURINE 5.5 02/26/2008 1640   GLUCOSEU Negative 02/28/2022 1343   HGBUR NEGATIVE 02/26/2008 1640   BILIRUBINUR Negative 02/28/2022 1343   KETONESUR NEGATIVE 02/26/2008 1640   PROTEINUR 1+ (A) 02/28/2022 1343   PROTEINUR NEGATIVE 02/26/2008 1640   UROBILINOGEN 0.2 02/26/2008 1640   NITRITE Positive (A) 02/28/2022 1343   NITRITE NEGATIVE 02/26/2008 1640    LEUKOCYTESUR 3+ (A) 02/28/2022 1343    Lab Results  Component Value Date   LABMICR 13.7 05/30/2022   WBCUA >30 (A) 02/28/2022   LABEPIT 0-10 02/28/2022   MUCUS Present 02/28/2022   BACTERIA Many (A) 02/28/2022    Pertinent Imaging: No results found for this or any previous visit.  No results found for this or any previous visit.  No results found for this or any previous visit.  No results found for this or any previous visit.  Results for orders placed during the hospital encounter of 10/26/21  US RENAL  Narrative CLINICAL DATA:  Renal insufficiency.  EXAM: RENAL / URINARY TRACT ULTRASOUND COMPLETE  COMPARISON:  [facility], 12/19/2010.  FINDINGS: Right Kidney:  Renal measurements: 8.8 x 4.0 x 4.3 cm = volume:  80 mL. Echogenicity within normal limits. No hydronephrosis  Well-circumscribed anechoic lesions within the RIGHT kidney, each measuring up to 1.9 cm and consistent with a renal cysts.  Left Kidney:  Renal measurements: 9.9 x 5.6 x 4.0 cm = volume: 116 mL. Echogenicity within normal limits. No mass or hydronephrosis visualized.  Bladder:  Appears normal for degree of bladder distention.  Other:  Postvoid volume residual of 14 mL  IMPRESSION: 1. No hydronephrosis. 2. Incidental two, 1.9 cm RIGHT renal cysts. 3. Postvoid residual of 14 mL   Electronically Signed By: Michaelle Birks M.D. On: 10/27/2021 15:47  No valid procedures specified. No results found for this or any previous visit.  No results found for this or any previous visit.  No results found for this or any previous visit (from the past 24 hour(s)).

## 2022-07-30 NOTE — Progress Notes (Signed)
1:43 PM  post void residual =26

## 2022-08-02 LAB — URINE CULTURE

## 2022-08-06 ENCOUNTER — Telehealth: Payer: Self-pay

## 2022-08-06 NOTE — Telephone Encounter (Signed)
Tried calling patient with no answer

## 2022-08-06 NOTE — Telephone Encounter (Signed)
-----   Message from [facility], [place] sent at 08/06/2022 11:01 AM EST ----- Please let pt know her urine cx is positive as she suspected. If she is still having sxs on Macrobid, she needs new Rx. She grew a form of Strep bacteria that is usually a contaminant, but will need new antibx that better covers Strep if she is still having any concern of UTI. ----- Message ----- From: Sherrilyn Rist, CMA Sent: 07/30/2022   2:08 PM EST To: Berneice Heinrich [place], PA-C

## 2022-08-07 NOTE — Telephone Encounter (Signed)
-----   Message from [facility], [place] sent at 08/06/2022 11:01 AM EST ----- Please let pt know her urine cx is positive as she suspected. If she is still having sxs on Macrobid, she needs new Rx. She grew a form of Strep bacteria that is usually a contaminant, but will need new antibx that better covers Strep if she is still having any concern of UTI. ----- Message ----- From: Sherrilyn Rist, CMA Sent: 07/30/2022   2:08 PM EST To: Berneice Heinrich [place], PA-C

## 2022-08-07 NOTE — Telephone Encounter (Signed)
Tried calling patient several time with no answer. Letter mail out.

## 2022-10-03 ENCOUNTER — Other Ambulatory Visit: Payer: Self-pay | Admitting: Family Medicine

## 2022-10-03 DIAGNOSIS — E039 Hypothyroidism, unspecified: Secondary | ICD-10-CM

## 2022-10-04 ENCOUNTER — Telehealth: Payer: Self-pay

## 2022-10-04 ENCOUNTER — Ambulatory Visit (INDEPENDENT_AMBULATORY_CARE_PROVIDER_SITE_OTHER): Payer: Medicare HMO | Admitting: Urology

## 2022-10-04 DIAGNOSIS — R399 Unspecified symptoms and signs involving the genitourinary system: Secondary | ICD-10-CM

## 2022-10-04 DIAGNOSIS — Z87440 Personal history of urinary (tract) infections: Secondary | ICD-10-CM | POA: Diagnosis not present

## 2022-10-04 LAB — URINALYSIS, ROUTINE W REFLEX MICROSCOPIC
Bilirubin, UA: NEGATIVE
Glucose, UA: NEGATIVE
Ketones, UA: NEGATIVE
Nitrite, UA: NEGATIVE
RBC, UA: NEGATIVE
Specific Gravity, UA: 1.015 (ref 1.005–1.030)
Urobilinogen, Ur: 0.2 mg/dL (ref 0.2–1.0)
pH, UA: 7 (ref 5.0–7.5)

## 2022-10-04 LAB — MICROSCOPIC EXAMINATION

## 2022-10-04 MED ORDER — AMOXICILLIN-POT CLAVULANATE 500-125 MG PO TABS: 1.0000 | ORAL_TABLET | Freq: Three times a day (TID) | ORAL | 0 refills | Status: DC

## 2022-10-04 NOTE — Progress Notes (Signed)
Patient came in with complaints of UTI symptoms.  Urine sent for ua and culture.  Patient aware we will reach out with results.  Verbal from Dr. Jeffie Pollock to send in Augmentin 500 TID qty 15.  Rx sent to pharmacy and patient aware.    Stacy Jensen, [place]

## 2022-10-04 NOTE — Telephone Encounter (Signed)
Stacy Jensen has appt with Dr Nicki Reaper 01/22 @ 11:00 and she is needing blood work ordered.  Pt wants a call back once ordered. Pt call back number is [phone]

## 2022-10-04 NOTE — Telephone Encounter (Signed)
Patient called advising she has a hx of UTI's. She advised her urine has a strong odor and she has been experiencing frequent urination. Patient would like to drop off a urine sample.

## 2022-10-04 NOTE — Telephone Encounter (Signed)
Lipid, liver, metabolic 7, CBC, cortisol, TSH, A1c Addison's, hyperlipidemia, HTN, polycythemia, prediabetes, hypothyroidism

## 2022-10-04 NOTE — Telephone Encounter (Signed)
Last labs completed 05/30/22 CBC, BMET, Urine Micro. Please advise. Thank you

## 2022-10-05 ENCOUNTER — Other Ambulatory Visit: Payer: Self-pay

## 2022-10-05 DIAGNOSIS — E039 Hypothyroidism, unspecified: Secondary | ICD-10-CM

## 2022-10-05 DIAGNOSIS — E249 Cushing's syndrome, unspecified: Secondary | ICD-10-CM

## 2022-10-05 DIAGNOSIS — E781 Pure hyperglyceridemia: Secondary | ICD-10-CM

## 2022-10-05 DIAGNOSIS — I10 Essential (primary) hypertension: Secondary | ICD-10-CM

## 2022-10-05 DIAGNOSIS — R7303 Prediabetes: Secondary | ICD-10-CM

## 2022-10-05 DIAGNOSIS — E271 Primary adrenocortical insufficiency: Secondary | ICD-10-CM

## 2022-10-05 NOTE — Telephone Encounter (Signed)
Patient returned our call. I have told her that the labs have been ordered and she will go and have them drawn.

## 2022-10-05 NOTE — Telephone Encounter (Signed)
Tried calling patient to inform of her lab orders, my chart message sent.

## 2022-10-07 LAB — URINE CULTURE

## 2022-10-09 ENCOUNTER — Telehealth: Payer: Self-pay

## 2022-10-09 DIAGNOSIS — I10 Essential (primary) hypertension: Secondary | ICD-10-CM | POA: Diagnosis not present

## 2022-10-09 DIAGNOSIS — E249 Cushing's syndrome, unspecified: Secondary | ICD-10-CM | POA: Diagnosis not present

## 2022-10-09 DIAGNOSIS — E781 Pure hyperglyceridemia: Secondary | ICD-10-CM | POA: Diagnosis not present

## 2022-10-09 DIAGNOSIS — E271 Primary adrenocortical insufficiency: Secondary | ICD-10-CM | POA: Diagnosis not present

## 2022-10-09 DIAGNOSIS — E039 Hypothyroidism, unspecified: Secondary | ICD-10-CM | POA: Diagnosis not present

## 2022-10-09 DIAGNOSIS — R7303 Prediabetes: Secondary | ICD-10-CM | POA: Diagnosis not present

## 2022-10-09 NOTE — Telephone Encounter (Signed)
Made patient aware to finished the Augmentin which will cover her infection. Patient voiced that she goes a see PCP on Monday and want Dr. Jeffie Pollock to review urine because when she has a UTI she do not have the normal sxs. Patient voiced understanding

## 2022-10-09 NOTE — Telephone Encounter (Signed)
-----  Message from Irine Seal, MD sent at 10/08/2022  4:47 PM EST ----- Finish augmentin. ----- Message ----- From: Sherrilyn Rist, CMA Sent: 10/08/2022  11:50 AM EST To: Irine Seal, MD  Please review

## 2022-10-11 LAB — LIPID PANEL
Chol/HDL Ratio: 4.7 ratio — ABNORMAL HIGH (ref 0.0–4.4)
Cholesterol, Total: 227 mg/dL — ABNORMAL HIGH (ref 100–199)
HDL: 48 mg/dL (ref >39)
LDL Chol Calc (NIH): 134 mg/dL — ABNORMAL HIGH (ref 0–99)
Triglycerides: 250 mg/dL — ABNORMAL HIGH (ref 0–149)
VLDL Cholesterol Cal: 45 mg/dL — ABNORMAL HIGH (ref 5–40)

## 2022-10-11 LAB — HEMOGLOBIN A1C
Est. average glucose Bld gHb Est-mCnc: 123 mg/dL
Hgb A1c MFr Bld: 5.9 % — ABNORMAL HIGH (ref 4.8–5.6)

## 2022-10-11 LAB — CBC WITH DIFFERENTIAL/PLATELET
Basophils Absolute: 0 10*3/uL (ref 0.0–0.2)
Basos: 1 %
EOS (ABSOLUTE): 0.5 10*3/uL — ABNORMAL HIGH (ref 0.0–0.4)
Eos: 5 %
Hematocrit: 51.6 % — ABNORMAL HIGH (ref 34.0–46.6)
Hemoglobin: 17.2 g/dL — ABNORMAL HIGH (ref 11.1–15.9)
Immature Grans (Abs): 0 10*3/uL (ref 0.0–0.1)
Immature Granulocytes: 0 %
Lymphocytes Absolute: 2.8 10*3/uL (ref 0.7–3.1)
Lymphs: 33 %
MCH: 31 pg (ref 26.6–33.0)
MCHC: 33.3 g/dL (ref 31.5–35.7)
MCV: 93 fL (ref 79–97)
Monocytes Absolute: 0.6 10*3/uL (ref 0.1–0.9)
Monocytes: 7 %
Neutrophils Absolute: 4.7 10*3/uL (ref 1.4–7.0)
Neutrophils: 54 %
Platelets: 202 10*3/uL (ref 150–450)
RBC: 5.55 x10E6/uL — ABNORMAL HIGH (ref 3.77–5.28)
RDW: 13.4 % (ref 11.7–15.4)
WBC: 8.6 10*3/uL (ref 3.4–10.8)

## 2022-10-11 LAB — HEPATIC FUNCTION PANEL
ALT: 15 IU/L (ref 0–32)
AST: 25 IU/L (ref 0–40)
Albumin: 4.5 g/dL (ref 3.9–4.9)
Alkaline Phosphatase: 73 IU/L (ref 44–121)
Bilirubin Total: 0.7 mg/dL (ref 0.0–1.2)
Bilirubin, Direct: 0.17 mg/dL (ref 0.00–0.40)
Total Protein: 7.2 g/dL (ref 6.0–8.5)

## 2022-10-11 LAB — BASIC METABOLIC PANEL
BUN/Creatinine Ratio: 8 — ABNORMAL LOW (ref 12–28)
BUN: 9 mg/dL (ref 8–27)
CO2: 22 mmol/L (ref 20–29)
Calcium: 10.1 mg/dL (ref 8.7–10.3)
Chloride: 103 mmol/L (ref 96–106)
Creatinine, Ser: 1.06 mg/dL — ABNORMAL HIGH (ref 0.57–1.00)
Glucose: 97 mg/dL (ref 70–99)
Potassium: 4 mmol/L (ref 3.5–5.2)
eGFR: 57 mL/min/{1.73_m2} — ABNORMAL LOW (ref >59)

## 2022-10-11 LAB — CORTISOL: Cortisol: 10.6 ug/dL (ref 6.2–19.4)

## 2022-10-11 LAB — TSH: TSH: 2.4 u[IU]/mL (ref 0.450–4.500)

## 2022-10-12 ENCOUNTER — Ambulatory Visit: Payer: Medicare HMO | Admitting: Family Medicine

## 2022-10-15 ENCOUNTER — Ambulatory Visit (INDEPENDENT_AMBULATORY_CARE_PROVIDER_SITE_OTHER): Payer: Medicare HMO | Admitting: Family Medicine

## 2022-10-15 VITALS — BP 153/86 | HR 103 | Temp 97.2°F | Ht 60.5 in | Wt 157.0 lb

## 2022-10-15 DIAGNOSIS — R7303 Prediabetes: Secondary | ICD-10-CM

## 2022-10-15 DIAGNOSIS — M791 Myalgia, unspecified site: Secondary | ICD-10-CM | POA: Diagnosis not present

## 2022-10-15 DIAGNOSIS — N289 Disorder of kidney and ureter, unspecified: Secondary | ICD-10-CM | POA: Diagnosis not present

## 2022-10-15 DIAGNOSIS — M810 Age-related osteoporosis without current pathological fracture: Secondary | ICD-10-CM

## 2022-10-15 DIAGNOSIS — E271 Primary adrenocortical insufficiency: Secondary | ICD-10-CM

## 2022-10-15 DIAGNOSIS — I10 Essential (primary) hypertension: Secondary | ICD-10-CM | POA: Diagnosis not present

## 2022-10-15 DIAGNOSIS — T466X5A Adverse effect of antihyperlipidemic and antiarteriosclerotic drugs, initial encounter: Secondary | ICD-10-CM | POA: Diagnosis not present

## 2022-10-15 DIAGNOSIS — E249 Cushing's syndrome, unspecified: Secondary | ICD-10-CM

## 2022-10-15 DIAGNOSIS — E7849 Other hyperlipidemia: Secondary | ICD-10-CM | POA: Diagnosis not present

## 2022-10-15 DIAGNOSIS — D582 Other hemoglobinopathies: Secondary | ICD-10-CM

## 2022-10-15 DIAGNOSIS — E039 Hypothyroidism, unspecified: Secondary | ICD-10-CM | POA: Diagnosis not present

## 2022-10-15 MED ORDER — VALSARTAN 80 MG PO TABS: 80.0000 mg | ORAL_TABLET | Freq: Every day | ORAL | 1 refills | Status: DC

## 2022-10-15 MED ORDER — REPATHA 140 MG/ML ~~LOC~~ SOSY: PREFILLED_SYRINGE | SUBCUTANEOUS | 5 refills | Status: DC

## 2022-10-15 NOTE — Patient Instructions (Signed)
Hi Stacy Jensen  It was good to see you today We will be in the process of setting you up an appointment with hematology for consultation regarding elevated hemoglobin  We will also help set you up with Dr.Ballen for follow-up on Cushing's  Please start valsartan 80 mg 1 daily for your blood pressure Follow-up here within 3 to 4 weeks to recheck blood pressure  Also please do metabolic 7 blood test in approximately 10 to 14 days to make sure that you are getting along with the valsartan  Our staff will also be setting you up for a bone density  Please let us know if you are having any additional issues otherwise we will see you in approximately 4 weeks  Take care-Dr. Nicki Reaper

## 2022-10-15 NOTE — Progress Notes (Signed)
   Subjective:    Patient ID: Stacy Jensen, female    DOB: March 08, 1956, 67 y.o.   MRN: [id number]  HPI Cushing syndrome ([place]) - Plan: Ambulatory referral to Endocrinology  Adrenal insufficiency (Addison's disease) ([place])  Renal insufficiency  Elevated hemoglobin ([place]) - Plan: Ambulatory referral to Hematology / Oncology  Prediabetes  Hypothyroidism, unspecified type  Essential hypertension, benign - Plan: Basic Metabolic Panel  Other hyperlipidemia  Osteoporosis, unspecified osteoporosis type, unspecified pathological fracture presence - Plan: DG Bone Density  Patient has multiple health issues.  Recently she has noted that she has been gaining weight in the hard to control her weight.  We have talked at length about the importance of healthy diet and activity She also has elevated blood pressure she was on lisinopril but no longer taking this.  She denies any chest tightness pressure pain shortness of breath She also has a history of Cushing syndrome and was being followed by Dr.Ballen but lost to follow-up  Recently she had lab work which showed elevation of hemoglobin and mild renal insufficiency. She also has osteoporosis issues.   Review of Systems     Objective:   Physical Exam General-in no acute distress Eyes-no discharge Lungs-respiratory rate normal, CTA CV-no murmurs,RRR Extremities skin warm dry no edema Neuro grossly normal Behavior normal, alert        Assessment & Plan:   1. Cushing syndrome [facility]) Referral to endocrinology for consultation.  Patient was under the care of Dr. Bubba Camp but felt away from this.  We will refer her back that direction - Ambulatory referral to Endocrinology  2. Adrenal insufficiency (Addison's disease) ([place]) On hydrocortisone  3. Renal insufficiency Slight elevation in creatinine  4. Elevated hemoglobin ([facility]) Elevation hemoglobin needs further evaluation by hematology - Ambulatory referral to Hematology /  Oncology  5. Prediabetes Prediabetes healthy diet recommended  6. Hypothyroidism, unspecified type Recent TSH looks good continue thyroid medicine  7. Essential hypertension, benign Blood pressure start valsartan 80 mg, check metabolic 7 and 2 weeks, follow-up office 4 weeks continue current measures - Basic Metabolic Panel  8. Other hyperlipidemia Will try to get patient on Repatha she may need assistance with this previous clinical pharmacist was able to get her Praluent covered  9. Osteoporosis, unspecified osteoporosis type, unspecified pathological fracture presence Bone density may well need to be on reclast - DG Bone Density  Will have Dr.Ballen consult regarding her hydrocortisone as well as osteoporosis.  Patient has previously been on oral biphosphonate's who more recently on IV Reclast will be due an up-to-date bone density likely result

## 2022-10-19 ENCOUNTER — Telehealth: Payer: Self-pay | Admitting: *Deleted

## 2022-10-19 NOTE — Telephone Encounter (Signed)
PA for Repatha approved by insurance . Approval good 09/24/22-09/24/23  Patient notified

## 2022-10-22 NOTE — Progress Notes (Signed)
Good afternoon Monica. Would you be able to help with this? Please let me know what I need to do, if anything. Thank you!

## 2022-10-22 NOTE — Progress Notes (Signed)
10/22/22-message sent to [facility], CPhT.

## 2022-10-22 NOTE — Progress Notes (Signed)
Pt contacted. Pt has appt set up with Dr.Ballen on 10/25/22. Pt will call back with dates to set up bone density.

## 2022-10-25 DIAGNOSIS — E039 Hypothyroidism, unspecified: Secondary | ICD-10-CM | POA: Diagnosis not present

## 2022-10-25 DIAGNOSIS — D443 Neoplasm of uncertain behavior of pituitary gland: Secondary | ICD-10-CM | POA: Diagnosis not present

## 2022-10-25 DIAGNOSIS — E240 Pituitary-dependent Cushing's disease: Secondary | ICD-10-CM | POA: Diagnosis not present

## 2022-10-25 DIAGNOSIS — M8580 Other specified disorders of bone density and structure, unspecified site: Secondary | ICD-10-CM | POA: Diagnosis not present

## 2022-10-25 DIAGNOSIS — E2749 Other adrenocortical insufficiency: Secondary | ICD-10-CM | POA: Diagnosis not present

## 2022-10-25 DIAGNOSIS — E1165 Type 2 diabetes mellitus with hyperglycemia: Secondary | ICD-10-CM | POA: Diagnosis not present

## 2022-10-25 DIAGNOSIS — I10 Essential (primary) hypertension: Secondary | ICD-10-CM | POA: Diagnosis not present

## 2022-10-26 ENCOUNTER — Other Ambulatory Visit: Payer: Self-pay | Admitting: Family Medicine

## 2022-10-26 DIAGNOSIS — E7849 Other hyperlipidemia: Secondary | ICD-10-CM

## 2022-10-29 ENCOUNTER — Telehealth: Payer: Self-pay

## 2022-10-29 DIAGNOSIS — I10 Essential (primary) hypertension: Secondary | ICD-10-CM | POA: Diagnosis not present

## 2022-10-29 NOTE — Progress Notes (Signed)
   Care Guide Note  10/29/2022 Name: [name] MRN: [id number] DOB: 02/17/1956  Referred by: Kathyrn Drown, MD Reason for referral : Care Coordination (Outreach to schedule with Pharm d )   ALAISA MOFFITT is a 67 y.o. year old female who is a primary care patient of Luking, Elayne Snare, MD. Elroy Channel was referred to the pharmacist for assistance related to DM.    Successful contact was made with the patient to discuss pharmacy services including being ready for the pharmacist to call at least 5 minutes before the scheduled appointment time, to have medication bottles and any blood sugar or blood pressure readings ready for review. The patient agreed to meet with the pharmacist via with the pharmacist via telephone visit on (date/time).  10/31/2022  Noreene Larsson, [place], [place] [address] Direct Dial: [phone] [email or web address]

## 2022-10-30 LAB — BASIC METABOLIC PANEL
BUN/Creatinine Ratio: 8 — ABNORMAL LOW (ref 12–28)
BUN: 9 mg/dL (ref 8–27)
CO2: 24 mmol/L (ref 20–29)
Calcium: 9.3 mg/dL (ref 8.7–10.3)
Chloride: 106 mmol/L (ref 96–106)
Creatinine, Ser: 1.12 mg/dL — ABNORMAL HIGH (ref 0.57–1.00)
Glucose: 88 mg/dL (ref 70–99)
Potassium: 4.1 mmol/L (ref 3.5–5.2)
Sodium: 145 mmol/L — ABNORMAL HIGH (ref 134–144)
eGFR: 53 mL/min/{1.73_m2} — ABNORMAL LOW (ref >59)

## 2022-10-31 ENCOUNTER — Other Ambulatory Visit: Payer: Medicare HMO | Admitting: Pharmacist

## 2022-10-31 ENCOUNTER — Ambulatory Visit (HOSPITAL_COMMUNITY)
Admission: RE | Admit: 2022-10-31 | Discharge: 2022-10-31 | Disposition: A | Discharge status: Home / Self Care | Payer: Medicare HMO | Source: Ambulatory Visit | Attending: Family Medicine | Admitting: Family Medicine

## 2022-10-31 DIAGNOSIS — M810 Age-related osteoporosis without current pathological fracture: Secondary | ICD-10-CM | POA: Diagnosis not present

## 2022-10-31 DIAGNOSIS — Z780 Asymptomatic menopausal state: Secondary | ICD-10-CM | POA: Diagnosis not present

## 2022-10-31 DIAGNOSIS — M8589 Other specified disorders of bone density and structure, multiple sites: Secondary | ICD-10-CM | POA: Diagnosis not present

## 2022-11-04 ENCOUNTER — Encounter: Payer: Self-pay | Admitting: Family Medicine

## 2022-11-04 NOTE — Progress Notes (Signed)
Please mail this copy of the letter as well as bone density to [facility] endocrinology [facility]

## 2022-11-12 ENCOUNTER — Ambulatory Visit (INDEPENDENT_AMBULATORY_CARE_PROVIDER_SITE_OTHER): Payer: Medicare HMO | Admitting: Family Medicine

## 2022-11-12 VITALS — BP 138/88 | Wt 157.2 lb

## 2022-11-12 DIAGNOSIS — I10 Essential (primary) hypertension: Secondary | ICD-10-CM | POA: Diagnosis not present

## 2022-11-12 NOTE — Patient Instructions (Signed)
Please have Dr.Balan send [place] a copy of your most recent lab work  We will see you back in 4 months thanks-Dr. Sallee Lange

## 2022-11-12 NOTE — Progress Notes (Signed)
   Subjective:    Patient ID: Stacy Jensen, female    DOB: Feb 17, 1956, 67 y.o.   MRN: [id number]  HPI  Patient arrives today to follow up on HTN medication Valsartan.  Here today for follow-up on blood pressure Trying to watch diet Trying to stay active Bone density does show increased risk of fracture She has been on Boniva in the past Patient being followed by Dr.Balan    Review of Systems     Objective:   Physical Exam General-in no acute distress Eyes-no discharge Lungs-respiratory rate normal, CTA CV-no murmurs,RRR Extremities skin warm dry no edema Neuro grossly normal Behavior normal, alert        Assessment & Plan:  Blood pressure fair control other times it is doing better so therefore we will can continue the medicine at the current dosing In addition to this monitor closely Watch diet closely Endocrinology will handle her bone issues Follow-up here for annual wellness visit in several weeks if blood pressure still elevated may need adjustment of the medicine Otherwise follow-up with me in 4 to 5 months

## 2022-11-15 DIAGNOSIS — D443 Neoplasm of uncertain behavior of pituitary gland: Secondary | ICD-10-CM | POA: Diagnosis not present

## 2022-11-26 ENCOUNTER — Other Ambulatory Visit: Payer: Self-pay | Admitting: Family Medicine

## 2022-12-03 ENCOUNTER — Other Ambulatory Visit: Payer: Self-pay

## 2022-12-03 MED ORDER — HYDROCORTISONE 10 MG PO TABS: ORAL_TABLET | ORAL | 1 refills | Status: DC

## 2022-12-05 NOTE — Progress Notes (Addendum)
10/30/2022 Name: Stacy Jensen MRN: [id number] DOB: Jul 24, 1956  Chief Complaint  Patient presents with   Hyperlipidemia    Stacy Jensen is a 67 y.o. year old female who presented for a telephone visit.   They were referred to the pharmacist by their PCP for assistance in managing hyperlipidemia and medication access.   Subjective:  Care Team: Primary Care Provider: Kathyrn Drown, MD ; Next Scheduled Visit: 11/12/22  Medication Access/Adherence  Current Pharmacy:  [facility] [address] [place] [address] Phone: [phone] Fax: [phone]   Patient reports affordability concerns with their medications: Yes  Patient reports access/transportation concerns to their pharmacy: No  Patient reports adherence concerns with their medications:  No     Hyperlipidemia/ASCVD Risk Reduction  Current lipid lowering medications:  Medications tried in the past:   Antiplatelet regimen:   ASCVD History:  Family History:  Risk Factors:   Current physical activity: encouraged, n/a  Current medication access support: previously enrolled in [employer] grant for praluent   Objective:  Lab Results  Component Value Date   HGBA1C 5.9 (H) 10/09/2022    Lab Results  Component Value Date   CREATININE 1.12 (H) 10/29/2022   BUN 9 10/29/2022   NA 145 (H) 10/29/2022   K 4.1 10/29/2022   CL 106 10/29/2022   CO2 24 10/29/2022    Lab Results  Component Value Date   CHOL 227 (H) 10/09/2022   HDL 48 10/09/2022   LDLCALC 134 (H) 10/09/2022   TRIG 250 (H) 10/09/2022   CHOLHDL 4.7 (H) 10/09/2022    Medications Reviewed Today     Reviewed by Orvan Seen, CMA (Certified Medical Assistant) on 11/12/22 at 1021  Med List Status: <None>   Medication Order Taking? Sig Documenting Provider Last Dose Status Informant  acetaminophen (TYLENOL) 650 MG CR tablet [id number] Yes Take 650 mg by mouth every 8 (eight) hours as needed for  pain. [provider] Taking Active Self  albuterol (VENTOLIN HFA) 108 (90 Base) MCG/ACT inhaler [id number] Yes Inhale 2 puffs into the lungs every 6 (six) hours as needed for wheezing. Kathyrn Drown, MD Taking Active Self  APPLE CIDER VINEGAR PO [id number] Yes Take 30 mLs by mouth in the morning and at bedtime. [provider] Taking Active Self  Aspirin-Acetaminophen-Caffeine Erick Alley [place]) [phone] MG PACK [id number] Yes Take 1 packet by mouth daily as needed (pain.). [provider] Taking Active Self  B Complex-C (B-COMPLEX WITH VITAMIN C) tablet [id number] Yes Take 1 tablet by mouth every evening. [provider] Taking Active Self  Calcium-Phosphorus-Vitamin D (CITRACAL +D3 PO) [id number] Yes Take 1 tablet by mouth See admin instructions. Take 1 tablet by mouth twice daily on Sundays & Wednesdays. Take 1 tablet by mouth once daily on Mondays, Tuesdays, Thursdays, Fridays & Saturdays. [provider] Taking Active Self  cetirizine (ZYRTEC) 10 MG tablet [id number] Yes Take 10 mg by mouth at bedtime. [provider] Taking Active Self  Coenzyme Q10 200 MG capsule [id number] Yes Take 200 mg by mouth in the morning. [provider] Taking Active Self           Med Note Kenton Kingfisher, Earley Favor   Fri Jan 12, 2022  3:22 PM)    Cyanocobalamin (VITAMIN B-12 PO) [id number] Yes Take 1 tablet by mouth in the morning. [provider] Taking Active Self  Evolocumab (REPATHA) 140 MG/ML SOSY [id number] Yes 1 mL  subcutaneous every 2 weeks Kathyrn Drown, MD Taking Active   famotidine (PEPCID) 20 MG tablet [id number] Yes Take 20 mg by mouth in the morning. [provider] Taking Active Self  hydrocortisone (CORTEF) 10 MG tablet [id number] Yes TAKE 2 TABLETS BY MOUTH ONCE DAILY WITH FOOD  Patient taking differently: TAKE 1/1/2 15 mg TABLETS BY MOUTH ONCE DAILY WITH FOOD   Kathyrn Drown, MD Taking Active   levothyroxine (SYNTHROID)  75 MCG tablet [id number] Yes TAKE 1 TABLET BY MOUTH ONCE DAILY 5 DAYS PER WEEK Luking, Scott A, MD Taking Active   Magnesium 200 MG TABS [id number] Yes Take 200 mg by mouth at bedtime. [provider] Taking Active Self  Misc Natural Products (COLON CLEANSE) CAPS [id number] Yes Take 1 capsule by mouth every evening. [provider] Taking Active Self  Multiple Vitamins-Minerals (MULTIVITAMIN WITH MINERALS) tablet [id number] Yes Take 1 tablet by mouth in the morning. Centrum Silver for Women 50+ [provider] Taking Active Self           Med Note Kenton Kingfisher, Earley Favor   Fri Jan 12, 2022  3:18 PM)    Omega-3 Fatty Acids (FISH OIL TRIPLE STRENGTH) 1400 MG CAPS [id number] Yes Take 1,400 mg by mouth in the morning. [provider] Taking Active Self  valsartan (DIOVAN) 80 MG tablet [id number] Yes Take 1 tablet (80 mg total) by mouth daily. Kathyrn Drown, MD Taking Active               Assessment/Plan:   Hyperlipidemia Significantly improved from baseline, but elevated at 134 this past January.  Will continue to follow. Current medications: alirocumab (Praluent) 75 mg subcutaneously every 2 weeks and fish oil 1 capsule by mouth daily Intolerances:  statins (body aches); patient unable to remember names of statins but per chart review looks like she even tried rosuvastatin twice weekly and was unable to tolerate; ezetimibe tried as well and was discontinued due to ineffectiveness per patient Change from alirocumab (Praluent) 75 mg to evolocumab (Repatha) '140mg'$  every 2 weeks subcutaneously every 2 weeks and fish oil 1 capsule by mouth daily Re-check fasting lipid panel every 3-6 months. If triglycerides increase, may need to consider restarting fibrate.   Enrolled in [employer] for evolocumab Copay card was mailed to the patient She continues to fill on time per dispense records    Follow Up Plan: 3 months  Regina Eck, PharmD,  [place] Pharmacist, [place]

## 2022-12-07 ENCOUNTER — Ambulatory Visit (INDEPENDENT_AMBULATORY_CARE_PROVIDER_SITE_OTHER): Payer: Medicare HMO

## 2022-12-07 VITALS — BP 136/88 | Ht 60.5 in | Wt 159.0 lb

## 2022-12-07 DIAGNOSIS — Z0000 Encounter for general adult medical examination without abnormal findings: Secondary | ICD-10-CM | POA: Diagnosis not present

## 2022-12-07 NOTE — Progress Notes (Signed)
Subjective:   Stacy Jensen is a 67 y.o. female who presents for Medicare Annual (Subsequent) preventive examination.  Review of Systems     Cardiac Risk Factors include: advanced age (>52men, >59 women);hypertension;obesity (BMI >30kg/m2);dyslipidemia;sedentary lifestyle     Objective:    Today's Vitals   12/07/22 0846  BP: 136/88  Weight: 159 lb (72.1 kg)  Height: 5' 0.5" (1.537 m)   Body mass index is 30.54 kg/m.     12/07/2022   12:09 PM 12/07/2022    9:08 AM 01/18/2022    7:04 AM 11/28/2021    8:38 AM  Advanced Directives  Does Patient Have a Medical Advance Directive? No No No No  Would patient like information on creating a medical advance directive? No - Patient declined No - Patient declined No - Patient declined No - Patient declined    Current Medications (verified) Outpatient Encounter Medications as of 12/07/2022  Medication Sig   acetaminophen (TYLENOL) 650 MG CR tablet Take 650 mg by mouth every 8 (eight) hours as needed for pain.   albuterol (VENTOLIN HFA) 108 (90 Base) MCG/ACT inhaler Inhale 2 puffs into the lungs every 6 (six) hours as needed for wheezing.   APPLE CIDER VINEGAR PO Take 30 mLs by mouth in the morning and at bedtime.   Aspirin-Acetaminophen-Caffeine (GOODYS EXTRA STRENGTH) 500-325-65 MG PACK Take 1 packet by mouth daily as needed (pain.).   B Complex-C (B-COMPLEX WITH VITAMIN C) tablet Take 1 tablet by mouth every evening.   Calcium-Phosphorus-Vitamin D (CITRACAL +D3 PO) Take 1 tablet by mouth See admin instructions. Take 1 tablet by mouth twice daily on Sundays & Wednesdays. Take 1 tablet by mouth once daily on Mondays, Tuesdays, Thursdays, Fridays & Saturdays.   cetirizine (ZYRTEC) 10 MG tablet Take 10 mg by mouth at bedtime.   Coenzyme Q10 200 MG capsule Take 200 mg by mouth in the morning.   Cyanocobalamin (VITAMIN B-12 PO) Take 1 tablet by mouth in the morning.   Evolocumab (REPATHA) 140 MG/ML SOSY 1 mL subcutaneous every 2 weeks    famotidine (PEPCID) 20 MG tablet Take 20 mg by mouth in the morning.   hydrocortisone (CORTEF) 10 MG tablet TAKE 2 TABLETS BY MOUTH ONCE DAILY WITH FOOD   levothyroxine (SYNTHROID) 75 MCG tablet TAKE 1 TABLET BY MOUTH ONCE DAILY 5 DAYS PER WEEK   Magnesium 200 MG TABS Take 200 mg by mouth at bedtime.   Misc Natural Products (COLON CLEANSE) CAPS Take 1 capsule by mouth every evening.   Multiple Vitamins-Minerals (MULTIVITAMIN WITH MINERALS) tablet Take 1 tablet by mouth in the morning. Centrum Silver for Women 50+   Omega-3 Fatty Acids (FISH OIL TRIPLE STRENGTH) 1400 MG CAPS Take 1,400 mg by mouth in the morning.   valsartan (DIOVAN) 80 MG tablet Take 1 tablet (80 mg total) by mouth daily.   No facility-administered encounter medications on file as of 12/07/2022.    Allergies (verified) Codeine and Statins   History: Past Medical History:  Diagnosis Date   Anemia    Arthritis    Cushing's syndrome (HCC)    GERD (gastroesophageal reflux disease) 04/11/2020   Hypertension    Hypertriglyceridemia    Hypopituitarism ([place]) 09/29/2021   Had cushings syndrome with pituitary growth, then had pituitary excision    Osteoporosis    Pituitary Cushing syndrome ([place])    tumor removed   Sleep apnea 2007   Past Surgical History:  Procedure Laterality Date   COLONOSCOPY WITH PROPOFOL N/A 01/18/2022  Procedure: COLONOSCOPY WITH PROPOFOL;  Surgeon: Rogene Houston, MD;  [facility]: [facility];  Service: Endoscopy;  Laterality: N/A;  820   PITUITARY EXCISION     POLYPECTOMY  01/18/2022   Procedure: POLYPECTOMY;  Surgeon: Rogene Houston, MD;  Location: [facility];  Service: Endoscopy;;   TUBAL LIGATION     Family History  Problem Relation Age of Onset   Hypertension Mother    Hypertension Father    Heart attack Father    Colon cancer Neg Hx    Social History   Socioeconomic History   Marital status: Married    Spouse name: Dominica Severin   Number of children: 4   Years of education: Not on  file   Highest education level: Not on file  Occupational History   Not on file  Tobacco Use   Smoking status: Never   Smokeless tobacco: Never  Vaping Use   Vaping Use: Never used  Substance and Sexual Activity   Alcohol use: Never   Drug use: Never   Sexual activity: Yes  Other Topics Concern   Not on file  Social History Narrative   Married x 45 years 6/23.   17 grandchildren   92 great grandchildren   Social Determinants of Health   Financial Resource Strain: Low Risk  (12/07/2022)   Overall Financial Resource Strain (CARDIA)    Difficulty of Paying Living Expenses: Not hard at all  Food Insecurity: No Food Insecurity (12/07/2022)   Hunger Vital Sign    Worried About Running Out of Food in the Last Year: Never true    Ran Out of Food in the Last Year: Never true  Transportation Needs: No Transportation Needs (12/07/2022)   PRAPARE - Hydrologist (Medical): No    Lack of Transportation (Non-Medical): No  Physical Activity: Inactive (12/07/2022)   Exercise Vital Sign    Days of Exercise per Week: 0 days    Minutes of Exercise per Session: 0 min  Stress: No Stress Concern Present (12/07/2022)   [place]    Feeling of Stress : Only a little  Social Connections: Socially Integrated (12/07/2022)   Social Connection and Isolation Panel [NHANES]    Frequency of Communication with Friends and Family: More than three times a week    Frequency of Social Gatherings with Friends and Family: More than three times a week    Attends Religious Services: More than 4 times per year    Active Member of [employer] or Organizations: Yes    Attends Music therapist: More than 4 times per year    Marital Status: Married    Tobacco Counseling Counseling given: Not Answered   Clinical Intake:  Pre-visit preparation completed: Yes  Pain : No/denies pain     Diabetes: No  How  often do you need to have someone help you when you read instructions, pamphlets, or other written materials from your doctor or pharmacy?: 1 - Never  Diabetic?No   Interpreter Needed?: No  Information entered by :: Denman George LPN   Activities of Daily Living    12/07/2022    9:08 AM  In your present state of health, do you have any difficulty performing the following activities:  Hearing? 0  Vision? 0  Difficulty concentrating or making decisions? 0  Walking or climbing stairs? 0  Dressing or bathing? 0  Doing errands, shopping? 0  Preparing Food and eating ?  N  Using the Toilet? N  In the past six months, have you accidently leaked urine? N  Do you have problems with loss of bowel control? N  Managing your Medications? N  Managing your Finances? N  Housekeeping or managing your Housekeeping? N    Patient Care Team: Kathyrn Drown, MD as PCP - General (Family Medicine) Lavera Guise, [facility] as Pharmacist (Family Medicine) Jacelyn Pi, MD as Referring Physician (Endocrinology) Festus Aloe, MD as Consulting Physician (Urology)  Indicate any recent Medical Services you may have received from other than [facility] providers in the past year (date may be approximate).     Assessment:   This is a routine wellness examination for Dakyra.  Hearing/Vision screen Hearing Screening - Comments:: Denies hearing difficulties   Vision Screening - Comments:: No vision problems; will schedule routine eye exam soon    Dietary issues and exercise activities discussed: Current Exercise Habits: The patient does not participate in regular exercise at present   Goals Addressed   None    Depression Screen    12/07/2022   12:08 PM 10/15/2022   11:13 AM 01/03/2022   11:01 AM 11/28/2021    8:34 AM 03/29/2021    9:55 AM 09/28/2020   10:27 AM 09/09/2020    4:01 PM  PHQ 2/9 Scores  PHQ - 2 Score 0 0 0 0 1 0 0  PHQ- 9 Score  1         Fall Risk    12/07/2022    9:07 AM 01/03/2022    11:01 AM 11/28/2021    8:39 AM 11/27/2021    4:59 PM 03/29/2021    9:54 AM  Fall Risk   Falls in the past year? 1 0 1 1 1   Number falls in past yr: 0  0 0 0  Injury with Fall? 0  0 1 1  Risk for fall due to : No Fall Risks No Fall Risks Impaired balance/gait;Impaired mobility  Impaired balance/gait  Follow up Falls prevention discussed;Education provided;Falls evaluation completed Falls evaluation completed Falls prevention discussed  Falls evaluation completed;Education provided    FALL RISK PREVENTION PERTAINING TO THE HOME:  Any stairs in or around the home? No  If so, are there any without handrails? No  Home free of loose throw rugs in walkways, pet beds, electrical cords, etc? Yes  Adequate lighting in your home to reduce risk of falls? Yes   ASSISTIVE DEVICES UTILIZED TO PREVENT FALLS:  Life alert? No  Use of a cane, walker or w/c? No  Grab bars in the bathroom? Yes  Shower chair or bench in shower? No  Elevated toilet seat or a handicapped toilet? Yes   TIMED UP AND GO:  Was the test performed? Yes .  Length of time to ambulate 10 feet: 6 sec.   Gait steady and fast without use of assistive device  Cognitive Function:        12/07/2022    9:10 AM 11/28/2021    8:43 AM  6CIT Screen  What Year? 0 points 0 points  What month? 0 points 0 points  What time? 0 points 0 points  Count back from 20 0 points 0 points  Months in reverse 0 points 0 points  Repeat phrase 0 points 0 points  Total Score 0 points 0 points    Immunizations Immunization History  Administered Date(s) Administered   Fluad Quad(high Dose 65+) 07/13/2019, 06/12/2022   Influenza, Quadrivalent, Recombinant, Inj, Pf 07/07/2018  Influenza-Unspecified 09/24/2006, 06/14/2016, 06/14/2017, 07/07/2018   PNEUMOCOCCAL CONJUGATE-20 12/13/2021   Pneumococcal Polysaccharide-23 09/04/2013   Td 07/16/2017, 12/13/2021    TDAP status: Up to date  Flu Vaccine status: Up to date  Pneumococcal vaccine  status: Up to date  Covid-19 vaccine status: Information provided on how to obtain vaccines.   Qualifies for Shingles Vaccine? Yes   Zostavax completed No   Shingrix Completed?: No.    Education has been provided regarding the importance of this vaccine. Patient has been advised to call insurance company to determine out of pocket expense if they have not yet received this vaccine. Advised may also receive vaccine at local pharmacy or Health Dept. Verbalized acceptance and understanding.  Screening Tests Health Maintenance  Topic Date Due   Zoster Vaccines- Shingrix (1 of 2) 01/14/2023 (Originally 10/29/1974)   MAMMOGRAM  05/25/2023   Medicare Annual Wellness (AWV)  12/07/2023   COLONOSCOPY (Pts 45-74yrs Insurance coverage will need to be confirmed)  01/19/2027   DTaP/Tdap/Td (3 - Tdap) 12/14/2031   Pneumonia Vaccine 23+ Years old  Completed   INFLUENZA VACCINE  Completed   DEXA SCAN  Completed   Hepatitis C Screening  Completed   HPV VACCINES  Aged Out   COVID-19 Vaccine  Discontinued    Health Maintenance  There are no preventive care reminders to display for this patient.   Colorectal cancer screening: Type of screening: Colonoscopy. Completed 01/18/22. Repeat every 5 years  Mammogram status: Completed 05/24/22. Repeat every year  Bone Density status: Completed 10/31/22. Results reflect: Bone density results: OSTEOPENIA. Repeat every 2 years.  Lung Cancer Screening: (Low Dose CT Chest recommended if Age 37-80 years, 30 pack-year currently smoking OR have quit w/in 15years.) does not qualify.   Lung Cancer Screening Referral: n/a  Additional Screening:  Hepatitis C Screening: does qualify; Completed 08/01/21  Vision Screening: Recommended annual ophthalmology exams for early detection of glaucoma and other disorders of the eye. Is the patient up to date with their annual eye exam?  Yes  Who is the provider or what is the name of the office in which the patient attends annual  eye exams? [facility]  If pt is not established with a provider, would they like to be referred to a provider to establish care? No .   Dental Screening: Recommended annual dental exams for proper oral hygiene  Community Resource Referral / Chronic Care Management: CRR required this visit?  No   CCM required this visit?  No      Plan:     I have personally reviewed and noted the following in the patient's chart:   Medical and social history Use of alcohol, tobacco or illicit drugs  Current medications and supplements including opioid prescriptions. Patient is not currently taking opioid prescriptions. Functional ability and status Nutritional status Physical activity Advanced directives List of other physicians Hospitalizations, surgeries, and ER visits in previous 12 months Vitals Screenings to include cognitive, depression, and falls Referrals and appointments  In addition, I have reviewed and discussed with patient certain preventive protocols, quality metrics, and best practice recommendations. A written personalized care plan for preventive services as well as general preventive health recommendations were provided to patient.     Denman George [place], [place]   [id number]   Nurse Notes: No concerns

## 2022-12-07 NOTE — Patient Instructions (Addendum)
Stacy Jensen , Thank you for taking time to come for your Medicare Wellness Visit. I appreciate your ongoing commitment to your health goals. Please review the following plan we discussed and let me know if I can assist you in the future.   These are the goals we discussed:  Goals      Exercise 3x per week (30 min per time)     Try chair exercises.      Medication Management     Patient Goals/Self-Care Activities Patient will:  Take medications as prescribed Collaborate with provider on medication access solutions           This is a list of the screening recommended for you and due dates:  Health Maintenance  Topic Date Due   Medicare Annual Wellness Visit  11/29/2022   Zoster (Shingles) Vaccine (1 of 2) 01/14/2023*   Mammogram  05/25/2023   Colon Cancer Screening  01/19/2027   DTaP/Tdap/Td vaccine (3 - Tdap) 12/14/2031   Pneumonia Vaccine  Completed   Flu Shot  Completed   DEXA scan (bone density measurement)  Completed   Hepatitis C Screening: USPSTF Recommendation to screen - Ages 60-79 yo.  Completed   HPV Vaccine  Aged Out   COVID-19 Vaccine  Discontinued  *Topic was postponed. The date shown is not the original due date.    Advanced directives: Please bring a copy of your health care power of attorney and living will to the office to be added to your chart at your convenience.   Conditions/risks identified: Aim for 30 minutes of exercise or brisk walking, 6-8 glasses of water, and 5 servings of fruits and vegetables each day.   Next appointment: Follow up in one year for your annual wellness visit    Preventive Care 65 Years and Older, Female Preventive care refers to lifestyle choices and visits with your health care provider that can promote health and wellness. What does preventive care include? A yearly physical exam. This is also called an annual well check. Dental exams once or twice a year. Routine eye exams. Ask your health care provider how often you  should have your eyes checked. Personal lifestyle choices, including: Daily care of your teeth and gums. Regular physical activity. Eating a healthy diet. Avoiding tobacco and drug use. Limiting alcohol use. Practicing safe sex. Taking low-dose aspirin every day. Taking vitamin and mineral supplements as recommended by your health care provider. What happens during an annual well check? The services and screenings done by your health care provider during your annual well check will depend on your age, overall health, lifestyle risk factors, and family history of disease. Counseling  Your health care provider may ask you questions about your: Alcohol use. Tobacco use. Drug use. Emotional well-being. Home and relationship well-being. Sexual activity. Eating habits. History of falls. Memory and ability to understand (cognition). Work and work Statistician. Reproductive health. Screening  You may have the following tests or measurements: Height, weight, and BMI. Blood pressure. Lipid and cholesterol levels. These may be checked every 5 years, or more frequently if you are over 80 years old. Skin check. Lung cancer screening. You may have this screening every year starting at age 49 if you have a 30-pack-year history of smoking and currently smoke or have quit within the past 15 years. Fecal occult blood test (FOBT) of the stool. You may have this test every year starting at age 49. Flexible sigmoidoscopy or colonoscopy. You may have a sigmoidoscopy every 5 years  or a colonoscopy every 10 years starting at age 57. Hepatitis C blood test. Hepatitis B blood test. Sexually transmitted disease (STD) testing. Diabetes screening. This is done by checking your blood sugar (glucose) after you have not eaten for a while (fasting). You may have this done every 1-3 years. Bone density scan. This is done to screen for osteoporosis. You may have this done starting at age 8. Mammogram. This may  be done every 1-2 years. Talk to your health care provider about how often you should have regular mammograms. Talk with your health care provider about your test results, treatment options, and if necessary, the need for more tests. Vaccines  Your health care provider may recommend certain vaccines, such as: Influenza vaccine. This is recommended every year. Tetanus, diphtheria, and acellular pertussis (Tdap, Td) vaccine. You may need a Td booster every 10 years. Zoster vaccine. You may need this after age 70. Pneumococcal 13-valent conjugate (PCV13) vaccine. One dose is recommended after age 66. Pneumococcal polysaccharide (PPSV23) vaccine. One dose is recommended after age 73. Talk to your health care provider about which screenings and vaccines you need and how often you need them. This information is not intended to replace advice given to you by your health care provider. Make sure you discuss any questions you have with your health care provider. Document Released: 10/07/2015 Document Revised: 05/30/2016 Document Reviewed: 07/12/2015 Elsevier Interactive Patient Education  2017 [place] Prevention in the Home Falls can cause injuries. They can happen to people of all ages. There are many things you can do to make your home safe and to help prevent falls. What can I do on the outside of my home? Regularly fix the edges of walkways and driveways and fix any cracks. Remove anything that might make you trip as you walk through a door, such as a raised step or threshold. Trim any bushes or trees on the path to your home. Use bright outdoor lighting. Clear any walking paths of anything that might make someone trip, such as rocks or tools. Regularly check to see if handrails are loose or broken. Make sure that both sides of any steps have handrails. Any raised decks and porches should have guardrails on the edges. Have any leaves, snow, or ice cleared regularly. Use sand or salt  on walking paths during winter. Clean up any spills in your garage right away. This includes oil or grease spills. What can I do in the bathroom? Use night lights. Install grab bars by the toilet and in the tub and shower. Do not use towel bars as grab bars. Use non-skid mats or decals in the tub or shower. If you need to sit down in the shower, use a plastic, non-slip stool. Keep the floor dry. Clean up any water that spills on the floor as soon as it happens. Remove soap buildup in the tub or shower regularly. Attach bath mats securely with double-sided non-slip rug tape. Do not have throw rugs and other things on the floor that can make you trip. What can I do in the bedroom? Use night lights. Make sure that you have a light by your bed that is easy to reach. Do not use any sheets or blankets that are too big for your bed. They should not hang down onto the floor. Have a firm chair that has side arms. You can use this for support while you get dressed. Do not have throw rugs and other things on the floor  that can make you trip. What can I do in the kitchen? Clean up any spills right away. Avoid walking on wet floors. Keep items that you use a lot in easy-to-reach places. If you need to reach something above you, use a strong step stool that has a grab bar. Keep electrical cords out of the way. Do not use floor polish or wax that makes floors slippery. If you must use wax, use non-skid floor wax. Do not have throw rugs and other things on the floor that can make you trip. What can I do with my stairs? Do not leave any items on the stairs. Make sure that there are handrails on both sides of the stairs and use them. Fix handrails that are broken or loose. Make sure that handrails are as long as the stairways. Check any carpeting to make sure that it is firmly attached to the stairs. Fix any carpet that is loose or worn. Avoid having throw rugs at the top or bottom of the stairs. If you  do have throw rugs, attach them to the floor with carpet tape. Make sure that you have a light switch at the top of the stairs and the bottom of the stairs. If you do not have them, ask someone to add them for you. What else can I do to help prevent falls? Wear shoes that: Do not have high heels. Have rubber bottoms. Are comfortable and fit you well. Are closed at the toe. Do not wear sandals. If you use a stepladder: Make sure that it is fully opened. Do not climb a closed stepladder. Make sure that both sides of the stepladder are locked into place. Ask someone to hold it for you, if possible. Clearly mark and make sure that you can see: Any grab bars or handrails. First and last steps. Where the edge of each step is. Use tools that help you move around (mobility aids) if they are needed. These include: Canes. Walkers. Scooters. Crutches. Turn on the lights when you go into a dark area. Replace any light bulbs as soon as they burn out. Set up your furniture so you have a clear path. Avoid moving your furniture around. If any of your floors are uneven, fix them. If there are any pets around you, be aware of where they are. Review your medicines with your doctor. Some medicines can make you feel dizzy. This can increase your chance of falling. Ask your doctor what other things that you can do to help prevent falls. This information is not intended to replace advice given to you by your health care provider. Make sure you discuss any questions you have with your health care provider. Document Released: 07/07/2009 Document Revised: 02/16/2016 Document Reviewed: 10/15/2014 Elsevier Interactive Patient Education  2017 [employer].

## 2023-02-04 ENCOUNTER — Ambulatory Visit (HOSPITAL_COMMUNITY)
Admission: RE | Admit: 2023-02-04 | Discharge: 2023-02-04 | Disposition: A | Discharge status: Home / Self Care | Payer: Medicare HMO | Source: Ambulatory Visit | Attending: Urology | Admitting: Urology

## 2023-02-04 DIAGNOSIS — N281 Cyst of kidney, acquired: Secondary | ICD-10-CM | POA: Diagnosis not present

## 2023-02-11 ENCOUNTER — Ambulatory Visit: Payer: Medicare HMO | Admitting: Urology

## 2023-02-11 ENCOUNTER — Encounter: Payer: Self-pay | Admitting: Urology

## 2023-02-11 VITALS — BP 186/88 | HR 108

## 2023-02-11 DIAGNOSIS — N3941 Urge incontinence: Secondary | ICD-10-CM | POA: Diagnosis not present

## 2023-02-11 DIAGNOSIS — R350 Frequency of micturition: Secondary | ICD-10-CM | POA: Diagnosis not present

## 2023-02-11 DIAGNOSIS — N281 Cyst of kidney, acquired: Secondary | ICD-10-CM | POA: Diagnosis not present

## 2023-02-11 DIAGNOSIS — R93421 Abnormal radiologic findings on diagnostic imaging of right kidney: Secondary | ICD-10-CM

## 2023-02-11 DIAGNOSIS — N200 Calculus of kidney: Secondary | ICD-10-CM

## 2023-02-11 DIAGNOSIS — R8271 Bacteriuria: Secondary | ICD-10-CM | POA: Diagnosis not present

## 2023-02-11 LAB — URINALYSIS, ROUTINE W REFLEX MICROSCOPIC
Bilirubin, UA: NEGATIVE
Glucose, UA: NEGATIVE
Ketones, UA: NEGATIVE
Nitrite, UA: NEGATIVE
Specific Gravity, UA: 1.01 (ref 1.005–1.030)
Urobilinogen, Ur: 0.2 mg/dL (ref 0.2–1.0)
pH, UA: 7 (ref 5.0–7.5)

## 2023-02-11 LAB — MICROSCOPIC EXAMINATION: Bacteria, UA: NONE SEEN

## 2023-02-11 LAB — BLADDER SCAN AMB NON-IMAGING: Scan Result: 37

## 2023-02-11 NOTE — Progress Notes (Signed)
02/11/2023 9:18 AM   Stacy Jensen 09-01-1956 [id number]  Referring provider: Babs Sciara, MD [address] [place],  [place] [address]  No chief complaint on file.   HPI:  F/u -    1) lower urinary tract symptoms-h/o frequency and urgency.  She has also had incontinence when she stands or gets home with sudden urgency x many years. Wears a pad. Worse after pituitary surgery in 2009, but no other NG risk. She tried oxybutynin but had bothersome dry mouth. She tried Myrbetriq also which didn't help and expensive. No GU surgery or pelvic surgery. No constipation. She takes probiotics. No gross hematuria.    She gets "UTI" with symptoms of urine odor. She drinks a lot of fountain coke and pepsi - two 44 oz per day.    She worked with [employer] at [employer] with PT. Drinking less caffeine/soda. PVR was 14 ml. Urgency improved. Her renal US images reviewed and benign from Feb 2023.   2) chronic kidney disease-her last creatinine was 1.44 with a GFR of 40-47 January 2023.  She does have a history of type 2 diabetes weeks. Feb 2023 renal US benign - 1.9 cm right renal cyst.     Today, she is seen for the above. She was seen three times throughout the year with no dysuria. Urgency and leakage constant. Not bothered. She doesn't recall why she goes. Urine cx grew e coli, strep and e coli. Abx help with urine. No dysuria.   Renal US May 2024 - two right renal cyst. Poss 7 mm right stone.    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   Had cushings syndrome with pituitary growth, then had pituitary excision    Osteoporosis    Pituitary Cushing syndrome (HCC)    tumor removed   Sleep apnea 2007    Surgical  History: Past Surgical History:  Procedure Laterality Date   COLONOSCOPY WITH PROPOFOL N/A 01/18/2022   Procedure: COLONOSCOPY WITH PROPOFOL;  Surgeon: Malissa Hippo, MD;  [facility];  Service: Endoscopy;  Laterality: N/A;  820   PITUITARY EXCISION     POLYPECTOMY  01/18/2022   Procedure: POLYPECTOMY;  Surgeon: Malissa Hippo, MD;  [facility]: [facility];  Service: Endoscopy;;   TUBAL LIGATION      Home Medications:  Allergies as of 02/11/2023       Reactions   Codeine Nausea And Vomiting   Statins Other (See Comments)   Body aches        Medication List        Accurate as of Feb 11, 2023  9:18 AM. If you have any questions, ask your nurse or doctor.          acetaminophen 650 MG CR tablet Commonly known as: TYLENOL Take 650 mg by mouth every 8 (eight) hours as needed for pain.   albuterol 108 (90 Base) MCG/ACT inhaler Commonly known as: VENTOLIN HFA Inhale 2 puffs into the lungs every 6 (six) hours as needed for wheezing.   APPLE CIDER VINEGAR PO Take 30 mLs by mouth in the morning and at bedtime.   B-complex with vitamin C tablet Take  1 tablet by mouth every evening.   cetirizine 10 MG tablet Commonly known as: ZYRTEC Take 10 mg by mouth at bedtime.   CITRACAL +D3 PO Take 1 tablet by mouth See admin instructions. Take 1 tablet by mouth twice daily on Sundays & Wednesdays. Take 1 tablet by mouth once daily on Mondays, Tuesdays, Thursdays, Fridays & Saturdays.   Coenzyme Q10 200 MG capsule Take 200 mg by mouth in the morning.   Colon Cleanse Caps Take 1 capsule by mouth every evening.   famotidine 20 MG tablet Commonly known as: PEPCID Take 20 mg by mouth in the morning.   Fish Oil Triple Strength 1400 MG Caps Take 1,400 mg by mouth in the morning.   Goodys Extra Strength [id number] MG Pack Generic drug: Aspirin-Acetaminophen-Caffeine Take 1 packet by mouth daily as needed (pain.).   hydrocortisone 10 MG tablet Commonly known as:  CORTEF TAKE 2 TABLETS BY MOUTH ONCE DAILY WITH FOOD   levothyroxine 75 MCG tablet Commonly known as: SYNTHROID TAKE 1 TABLET BY MOUTH ONCE DAILY 5 DAYS PER WEEK   Magnesium 200 MG Tabs Take 200 mg by mouth at bedtime.   multivitamin with minerals tablet Take 1 tablet by mouth in the morning. Centrum Silver for Women 50+   Repatha 140 MG/ML Sosy Generic drug: Evolocumab 1 mL subcutaneous every 2 weeks   valsartan 80 MG tablet Commonly known as: DIOVAN Take 1 tablet (80 mg total) by mouth daily.   VITAMIN B-12 PO Take 1 tablet by mouth in the morning.        Allergies:  Allergies  Allergen Reactions   Codeine Nausea And Vomiting   Statins Other (See Comments)    Body aches    Family History: Family History  Problem Relation Age of Onset   Hypertension Mother    Hypertension Father    Heart attack Father    Colon cancer Neg Hx     Social History:  reports that she has never smoked. She has never used smokeless tobacco. She reports that she does not drink alcohol and does not use drugs.   Physical Exam: There were no vitals taken for this visit.  Constitutional:  Alert and oriented, No acute distress. HEENT: [place] AT, moist mucus membranes.  Trachea midline, no masses. Cardiovascular: No clubbing, cyanosis, or edema. Respiratory: Normal respiratory effort, no increased work of breathing. GI: Abdomen is soft, nontender, nondistended, no abdominal masses GU: No CVA tenderness Skin: No rashes, bruises or suspicious lesions. Neurologic: Grossly intact, no focal deficits, moving all 4 extremities. Psychiatric: Normal mood and affect.  Laboratory Data: Lab Results  Component Value Date   WBC 8.6 10/09/2022   HGB 17.2 (H) 10/09/2022   HCT 51.6 (H) 10/09/2022   MCV 93 10/09/2022   PLT 202 10/09/2022    Lab Results  Component Value Date   CREATININE 1.12 (H) 10/29/2022    No results found for: "PSA"  No results found for: "TESTOSTERONE"  Lab Results   Component Value Date   HGBA1C 5.9 (H) 10/09/2022    Urinalysis    Component Value Date/Time   COLORURINE YELLOW 02/26/2008 1429   APPEARANCEUR Clear 10/04/2022 1411   LABSPEC 1.015 02/26/2008 1640   PHURINE 5.5 02/26/2008 1640   GLUCOSEU Negative 10/04/2022 1411   HGBUR NEGATIVE 02/26/2008 1640   BILIRUBINUR Negative 10/04/2022 1411   KETONESUR NEGATIVE 02/26/2008 1640   PROTEINUR Trace 10/04/2022 1411   PROTEINUR NEGATIVE 02/26/2008 1640   UROBILINOGEN 0.2 07/30/2022 1403   UROBILINOGEN  0.2 02/26/2008 1640   NITRITE Negative 10/04/2022 1411   NITRITE NEGATIVE 02/26/2008 1640   LEUKOCYTESUR 2+ (A) 10/04/2022 1411    Lab Results  Component Value Date   LABMICR See below: 10/04/2022   WBCUA 11-30 (A) 10/04/2022   LABEPIT 0-10 10/04/2022   MUCUS Present 02/28/2022   BACTERIA Many (A) 10/04/2022    Pertinent Imaging: N/a Results for orders placed during the hospital encounter of 02/04/23  US RENAL  Narrative CLINICAL DATA:  Renal cysts follow-up  EXAM: RENAL / URINARY TRACT ULTRASOUND COMPLETE  COMPARISON:  None Available.  FINDINGS: Right Kidney:  Renal measurements: 9.3 x 4.5 x 4.4 cm = volume: 97.5 mL. Contains 2 cysts with the largest measuring 1.5 cm. Contains 2 probable stones with the largest measuring 7 mm. No obstruction. Increased cortical echogenicity.  Left Kidney:  Renal measurements: 10.1 x 5.4 x 3.4 cm = volume: 95 mL. Increased cortical echogenicity  Bladder:  Appears normal for degree of bladder distention.  Other:  None.  IMPRESSION: 1. Increased cortical echogenicity in both kidneys consistent with medical renal disease. 2. 2 cysts in the right kidney. 3. 2 probable stones in the right kidney with the largest measuring 7 mm. No obstruction.   Electronically Signed By: Gerome Sam III M.D. On: 02/05/2023 16:57  No valid procedures specified. No results found for this or any previous visit.  No results found for  this or any previous visit.   Assessment & Plan:    1) bacteruria - again discussed urine odor a sign of bacteria but not ifection. Increase water and take d-mannose.   2) freq. Urge -mixed incontinence- discussed meds and procedures. She is not bothered.   3) possible right renal stone on [place] - check KUB . Plan on F/u in 1 year with renal US   4) renal cyst - disc benign nature   No follow-ups on file.  Jerilee Field, MD  [facility]  [address] [place], [place] [address] [phone]

## 2023-02-12 ENCOUNTER — Ambulatory Visit (HOSPITAL_COMMUNITY)
Admission: RE | Admit: 2023-02-12 | Discharge: 2023-02-12 | Disposition: A | Discharge status: Home / Self Care | Payer: Medicare HMO | Source: Ambulatory Visit | Attending: Urology | Admitting: Urology

## 2023-02-12 DIAGNOSIS — N200 Calculus of kidney: Secondary | ICD-10-CM | POA: Insufficient documentation

## 2023-02-13 ENCOUNTER — Telehealth: Payer: Self-pay

## 2023-02-13 NOTE — Telephone Encounter (Signed)
Patient called advising that she completed the renal US on 5/14 and completed her x-ray yesterday. She advised at her visit with radiology yesterday they stated she needed another renal US since there was one ordered on 5/20. She wanted to ensure that there was not a need for another [place] and that the other order could be deleted.     Thank you

## 2023-02-15 ENCOUNTER — Ambulatory Visit: Payer: Medicare HMO

## 2023-02-15 ENCOUNTER — Telehealth: Payer: Self-pay

## 2023-02-15 DIAGNOSIS — Z87440 Personal history of urinary (tract) infections: Secondary | ICD-10-CM

## 2023-02-15 DIAGNOSIS — N390 Urinary tract infection, site not specified: Secondary | ICD-10-CM | POA: Diagnosis not present

## 2023-02-15 LAB — MICROSCOPIC EXAMINATION: WBC, UA: >30 /hpf — AB (ref 0–5)

## 2023-02-15 LAB — URINALYSIS, ROUTINE W REFLEX MICROSCOPIC
Bilirubin, UA: NEGATIVE
Glucose, UA: NEGATIVE
Ketones, UA: NEGATIVE
Nitrite, UA: POSITIVE — AB
Specific Gravity, UA: 1.01 (ref 1.005–1.030)
Urobilinogen, Ur: 0.2 mg/dL (ref 0.2–1.0)
pH, UA: 7.5 (ref 5.0–7.5)

## 2023-02-15 MED ORDER — NITROFURANTOIN MONOHYD MACRO 100 MG PO CAPS
100.0000 mg | ORAL_CAPSULE | Freq: Two times a day (BID) | ORAL | 0 refills | Status: AC
Start: 2023-02-15 — End: ?

## 2023-02-15 NOTE — Telephone Encounter (Signed)
Tried calling patient with no answer to make her aware of her positive urinalysis results and an antibiotic  Macrobid 100 mg BID X 7 day was sent in to her pharmacy. Unable to leave voiced message.

## 2023-02-15 NOTE — Progress Notes (Signed)
Patient presents today with complaints of  recurring UTI.  UA and Culture done today.  Dr. Ronne Binning  reviewed  UA results and treatment started 02/15/23 Macrobid 100 mg BID X7 days.  Patient aware of MD recommendations and that we will reach out with culture results.      [id number], CMA

## 2023-02-15 NOTE — Telephone Encounter (Signed)
Patient leaving a urine specimen for amylases. Having fever and pain. Frequent urination.

## 2023-02-19 DIAGNOSIS — H5203 Hypermetropia, bilateral: Secondary | ICD-10-CM | POA: Diagnosis not present

## 2023-02-19 LAB — URINE CULTURE

## 2023-02-19 NOTE — Telephone Encounter (Signed)
I called patient to follow up on a triage call from 05/24.  Unable to reach patient, I left a deatiled message informing patient of rx at pharmacy.  This week's office hours provided along with call back number.

## 2023-02-25 ENCOUNTER — Telehealth: Payer: Self-pay

## 2023-02-25 NOTE — Telephone Encounter (Signed)
-----   Message from Jerilee Field, MD sent at 02/25/2023  4:29 PM EDT ----- Let her know that no kidney stones were seen on her plain xray. There were gallstones seen. Thanks.   ----- Message ----- From: Troy Sine, CMA Sent: 02/20/2023   4:19 PM EDT To: Jerilee Field, MD  Please review

## 2023-02-25 NOTE — Telephone Encounter (Signed)
Letter sent out making patient aware of Dr. Mena Goes recommendation.

## 2023-03-07 DIAGNOSIS — H524 Presbyopia: Secondary | ICD-10-CM | POA: Diagnosis not present

## 2023-03-07 DIAGNOSIS — H52223 Regular astigmatism, bilateral: Secondary | ICD-10-CM | POA: Diagnosis not present

## 2023-03-22 ENCOUNTER — Other Ambulatory Visit: Payer: Medicare HMO

## 2023-03-22 ENCOUNTER — Telehealth: Payer: Self-pay

## 2023-03-22 ENCOUNTER — Other Ambulatory Visit: Payer: Self-pay | Admitting: Urology

## 2023-03-22 DIAGNOSIS — R8290 Unspecified abnormal findings in urine: Secondary | ICD-10-CM

## 2023-03-22 DIAGNOSIS — Z87440 Personal history of urinary (tract) infections: Secondary | ICD-10-CM

## 2023-03-22 DIAGNOSIS — R399 Unspecified symptoms and signs involving the genitourinary system: Secondary | ICD-10-CM

## 2023-03-22 LAB — MICROSCOPIC EXAMINATION: WBC, UA: >30 /hpf — AB (ref 0–5)

## 2023-03-22 LAB — URINALYSIS, ROUTINE W REFLEX MICROSCOPIC
Bilirubin, UA: NEGATIVE
Glucose, UA: NEGATIVE
Ketones, UA: NEGATIVE
Nitrite, UA: POSITIVE — AB
Specific Gravity, UA: 1.01 (ref 1.005–1.030)
Urobilinogen, Ur: 0.2 mg/dL (ref 0.2–1.0)
pH, UA: 7 (ref 5.0–7.5)

## 2023-03-22 MED ORDER — AMOXICILLIN-POT CLAVULANATE 875-125 MG PO TABS
1.0000 | ORAL_TABLET | Freq: Two times a day (BID) | ORAL | 0 refills | Status: AC
Start: 2023-03-22 — End: ?

## 2023-03-22 NOTE — Progress Notes (Signed)
Patient came for lab visit today to provide urine specimen due to acute UTI symptoms. UA appears infected. Will check urine culture. Was treated with Macrobid on 02/15/2023 for positive urine culture (grew E. Coli). Will treat empirically this time with Augmentin while awaiting culture results and sensitivities.  Evette Georges, MSN, FNP-C, [facility] Urology Nurse Practitioner [facility] Urology [place]

## 2023-03-22 NOTE — Telephone Encounter (Signed)
-----   Message from [facility], LPN sent at 1/61/0960  2:00 PM EDT -----  ----- Message ----- From: Donnita Falls, FNP Sent: 03/22/2023   1:40 PM EDT To: Gustavus Messing, LPN  Please let pt know that UA appears infected. I have sent prescription for Augmentin for suspected UTI. Thanks.

## 2023-03-22 NOTE — Progress Notes (Addendum)
Patient presents today with complaints of  history of UTI.  UA and Culture done today.  Dr. Belia Heman, NP reviewed results and treatment started .  Patient aware of MD recommendations and that we will reach out with culture results.      [id number], CMA

## 2023-03-22 NOTE — Telephone Encounter (Signed)
I called patient with no response, left voicemail message, will send mychart message.

## 2023-03-22 NOTE — Telephone Encounter (Addendum)
Patient came in today with back pain and cloudy urine. Consulted with Maralyn Sago, NP to get patient on her scheduled to  be seen for  UTI sxs. Maralyn Sago, NP is made aware that patient saw Dr. Mena Goes back in May with a positive urine culture, Maralyn Sago was made aware she had a cancellation on her scheduled for a 10:30 am slot.  Maralyn Sago states patient do not need to be seen by a provider that the patient could go on lab scheduled as a urine drop off. Patient is made aware to do a urine drop off.

## 2023-03-25 ENCOUNTER — Telehealth: Payer: Self-pay

## 2023-03-25 NOTE — Telephone Encounter (Signed)
Patient made aware of UTI and Rx sent to pharmacy. Patient voiced understanding.

## 2023-03-26 ENCOUNTER — Telehealth: Payer: Self-pay

## 2023-03-26 NOTE — Telephone Encounter (Signed)
Returning call for results and treatment.  Please advise  Call: [phone]

## 2023-03-26 NOTE — Telephone Encounter (Signed)
Open in error

## 2023-03-28 LAB — URINE CULTURE

## 2023-04-25 ENCOUNTER — Other Ambulatory Visit: Payer: Self-pay | Admitting: Family Medicine

## 2023-05-13 ENCOUNTER — Encounter: Payer: Self-pay | Admitting: Urology

## 2023-05-13 ENCOUNTER — Ambulatory Visit: Payer: Medicare HMO | Admitting: Urology

## 2023-05-13 VITALS — BP 179/112 | HR 112 | Temp 97.5°F

## 2023-05-13 DIAGNOSIS — R399 Unspecified symptoms and signs involving the genitourinary system: Secondary | ICD-10-CM

## 2023-05-13 DIAGNOSIS — N281 Cyst of kidney, acquired: Secondary | ICD-10-CM

## 2023-05-13 LAB — URINALYSIS, ROUTINE W REFLEX MICROSCOPIC
Bilirubin, UA: NEGATIVE
Glucose, UA: NEGATIVE
Ketones, UA: NEGATIVE
Nitrite, UA: POSITIVE — AB
Specific Gravity, UA: 1.015 (ref 1.005–1.030)
Urobilinogen, Ur: 0.2 mg/dL (ref 0.2–1.0)
pH, UA: 7 (ref 5.0–7.5)

## 2023-05-13 LAB — BLADDER SCAN AMB NON-IMAGING: Scan Result: 6

## 2023-05-13 LAB — MICROSCOPIC EXAMINATION: WBC, UA: >30 /hpf — AB (ref 0–5)

## 2023-05-13 MED ORDER — NITROFURANTOIN MONOHYD MACRO 100 MG PO CAPS: 100.0000 mg | ORAL_CAPSULE | Freq: Two times a day (BID) | ORAL | 0 refills | Status: AC

## 2023-05-13 NOTE — Progress Notes (Unsigned)
05/13/2023 1:47 PM   Stacy Jensen August 10, 1956 [id number]  Referring provider: Babs Sciara, MD [address] [place],  [place] [address]  No chief complaint on file.   HPI:  F/u -    1) frequency and urgency - She has also had incontinence when she stands or gets home with sudden urgency x many years. Wears a pad. Worse after pituitary surgery in 2009, but no other NG risk. She tried oxybutynin but had bothersome dry mouth. She tried Myrbetriq also which didn't help and expensive. No GU surgery or pelvic surgery. No constipation. She takes probiotics.   2) UTI - She gets "UTI" with symptoms of urine odor. She drinks a lot of fountain coke and pepsi - two 44 oz per day. Urine cx grew e coli, strep and e coli. Abx help with urine but sometimes she has no dysuria.    She worked with [employer] at [facility] with PT. Drinking less caffeine/soda. PVR was 14 ml. Urgency improved. Her renal US images reviewed and benign from Feb 2023.    3) renal cyst, chronic kidney disease-her last creatinine was 1.44 with a GFR of 40-47 January 2023.  She does have a history of type 2 diabetes weeks. Feb 2023 renal US benign - 1.9 cm right renal cyst.    Renal US May 2024 - two right renal cyst. Poss 7 mm right stone.    Today, she was added on for dysuria. She felt hot and sweaty Sat night. She felt better Sunday. Now has urgency and SP pressure. She knows about the gall stone.   UA today with many bacteria.   May 2024 KUB - no renal stones. She had a large gall stone. 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   Had cushings syndrome with pituitary growth, then had pituitary excision    Osteoporosis    Pituitary Cushing  syndrome (HCC)    tumor removed   Sleep apnea 2007    Surgical History: Past Surgical History:  Procedure Laterality Date   COLONOSCOPY WITH PROPOFOL N/A 01/18/2022   Procedure: COLONOSCOPY WITH PROPOFOL;  Surgeon: Malissa Hippo, MD;  [facility];  Service: Endoscopy;  Laterality: N/A;  820   PITUITARY EXCISION     POLYPECTOMY  01/18/2022   Procedure: POLYPECTOMY;  Surgeon: Malissa Hippo, MD;  [facility];  Service: Endoscopy;;   TUBAL LIGATION      Home Medications:  Allergies as of 05/13/2023       Reactions   Codeine Nausea And Vomiting   Statins Other (See Comments)   Body aches        Medication List        Accurate as of May 13, 2023  1:47 PM. If you have any questions, ask your nurse or doctor.          acetaminophen 650 MG CR tablet Commonly known as: TYLENOL Take 650 mg by mouth every 8 (eight) hours as needed for pain.   albuterol 108 (90 Base) MCG/ACT inhaler Commonly known as: VENTOLIN HFA Inhale 2 puffs into the lungs every 6 (six) hours as needed for wheezing.  amoxicillin-clavulanate 875-125 MG tablet Commonly known as: AUGMENTIN Take 1 tablet by mouth every 12 (twelve) hours.   APPLE CIDER VINEGAR PO Take 30 mLs by mouth in the morning and at bedtime.   B-complex with vitamin C tablet Take 1 tablet by mouth every evening.   cetirizine 10 MG tablet Commonly known as: ZYRTEC Take 10 mg by mouth at bedtime.   CITRACAL +D3 PO Take 1 tablet by mouth See admin instructions. Take 1 tablet by mouth twice daily on Sundays & Wednesdays. Take 1 tablet by mouth once daily on Mondays, Tuesdays, Thursdays, Fridays & Saturdays.   Coenzyme Q10 200 MG capsule Take 200 mg by mouth in the morning.   Colon Cleanse Caps Take 1 capsule by mouth every evening.   famotidine 20 MG tablet Commonly known as: PEPCID Take 20 mg by mouth in the morning.   Fish Oil Triple Strength 1400 MG Caps Take 1,400 mg by mouth in the  morning.   Goodys Extra Strength [id number] MG Pack Generic drug: Aspirin-Acetaminophen-Caffeine Take 1 packet by mouth daily as needed (pain.).   hydrocortisone 10 MG tablet Commonly known as: CORTEF TAKE 2 TABLETS BY MOUTH ONCE DAILY WITH FOOD   levothyroxine 75 MCG tablet Commonly known as: SYNTHROID TAKE 1 TABLET BY MOUTH ONCE DAILY 5 DAYS PER WEEK   Magnesium 200 MG Tabs Take 200 mg by mouth at bedtime.   multivitamin with minerals tablet Take 1 tablet by mouth in the morning. Centrum Silver for Women 50+   nitrofurantoin (macrocrystal-monohydrate) 100 MG capsule Commonly known as: MACROBID Take 1 capsule (100 mg total) by mouth every 12 (twelve) hours.   Repatha 140 MG/ML Sosy Generic drug: Evolocumab INJECT 1 ML SUBCUTANEOUSLY  EVERY TWO WEEKS   valsartan 80 MG tablet Commonly known as: DIOVAN Take 1 tablet (80 mg total) by mouth daily.   VITAMIN B-12 PO Take 1 tablet by mouth in the morning.        Allergies:  Allergies  Allergen Reactions   Codeine Nausea And Vomiting   Statins Other (See Comments)    Body aches    Family History: Family History  Problem Relation Age of Onset   Hypertension Mother    Hypertension Father    Heart attack Father    Colon cancer Neg Hx     Social History:  reports that she has never smoked. She has never used smokeless tobacco. She reports that she does not drink alcohol and does not use drugs.   Physical Exam: BP (!) 179/112   Pulse (!) 112   Temp (!) 97.5 F (36.4 C)   Constitutional:  Alert and oriented, No acute distress. HEENT: [place] AT, moist mucus membranes.  Trachea midline, no masses. Cardiovascular: No clubbing, cyanosis, or edema. Respiratory: Normal respiratory effort, no increased work of breathing. GI: Abdomen is soft, nontender, nondistended, no abdominal masses GU: No CVA tenderness Skin: No rashes, bruises or suspicious lesions. Neurologic: Grossly intact, no focal deficits, moving all 4  extremities. Psychiatric: Normal mood and affect.  Laboratory Data: Lab Results  Component Value Date   WBC 8.6 10/09/2022   HGB 17.2 (H) 10/09/2022   HCT 51.6 (H) 10/09/2022   MCV 93 10/09/2022   PLT 202 10/09/2022    Lab Results  Component Value Date   CREATININE 1.12 (H) 10/29/2022    No results found for: "PSA"  No results found for: "TESTOSTERONE"  Lab Results  Component Value Date   HGBA1C 5.9 (H) 10/09/2022  Urinalysis    Component Value Date/Time   COLORURINE YELLOW 02/26/2008 1429   APPEARANCEUR Clear 03/22/2023 1328   LABSPEC 1.015 02/26/2008 1640   PHURINE 5.5 02/26/2008 1640   GLUCOSEU Negative 03/22/2023 1328   HGBUR NEGATIVE 02/26/2008 1640   BILIRUBINUR Negative 03/22/2023 1328   KETONESUR NEGATIVE 02/26/2008 1640   PROTEINUR 2+ (A) 03/22/2023 1328   PROTEINUR NEGATIVE 02/26/2008 1640   UROBILINOGEN 0.2 07/30/2022 1403   UROBILINOGEN 0.2 02/26/2008 1640   NITRITE Positive (A) 03/22/2023 1328   NITRITE NEGATIVE 02/26/2008 1640   LEUKOCYTESUR 3+ (A) 03/22/2023 1328    Lab Results  Component Value Date   LABMICR See below: 03/22/2023   WBCUA >30 (A) 03/22/2023   LABEPIT 0-10 03/22/2023   MUCUS Present 02/28/2022   BACTERIA Many (A) 03/22/2023    Pertinent Imaging:  Results for orders placed during the hospital encounter of 02/12/23  DG Abd 1 View  Narrative CLINICAL DATA:  Kidney stone  EXAM: ABDOMEN - 1 VIEW  COMPARISON:  None Available.  FINDINGS: The bowel gas pattern is normal. Right upper quadrant calcifications noted that are likely gallstones.  IMPRESSION: Probable gallstones.  Unremarkable bowel gas pattern.   Electronically Signed By: Layla Maw M.D. On: 02/16/2023 18:38  No results found for this or any previous visit.  No results found for this or any previous visit.  No results found for this or any previous visit.  Results for orders placed during the hospital encounter of 02/04/23  US  RENAL  Narrative CLINICAL DATA:  Renal cysts follow-up  EXAM: RENAL / URINARY TRACT ULTRASOUND COMPLETE  COMPARISON:  None Available.  FINDINGS: Right Kidney:  Renal measurements: 9.3 x 4.5 x 4.4 cm = volume: 97.5 mL. Contains 2 cysts with the largest measuring 1.5 cm. Contains 2 probable stones with the largest measuring 7 mm. No obstruction. Increased cortical echogenicity.  Left Kidney:  Renal measurements: 10.1 x 5.4 x 3.4 cm = volume: 95 mL. Increased cortical echogenicity  Bladder:  Appears normal for degree of bladder distention.  Other:  None.  IMPRESSION: 1. Increased cortical echogenicity in both kidneys consistent with medical renal disease. 2. 2 cysts in the right kidney. 3. 2 probable stones in the right kidney with the largest measuring 7 mm. No obstruction.   Electronically Signed By: Gerome Sam III M.D. On: 02/05/2023 16:57  No valid procedures specified. No results found for this or any previous visit.  No results found for this or any previous visit.   Assessment & Plan:    1. UTI symptoms - I sent NF for 5 days. Urine sent for cx   2. renal cyst - F/u May 2024 with renal US prior as planned   - Urinalysis, Routine w reflex microscopic - BLADDER SCAN AMB NON-IMAGING   No follow-ups on file.  Jerilee Field, MD  [facility]  [address] [place], [place] [address] [phone]

## 2023-05-16 LAB — URINE CULTURE

## 2023-05-23 DIAGNOSIS — Z8249 Family history of ischemic heart disease and other diseases of the circulatory system: Secondary | ICD-10-CM | POA: Diagnosis not present

## 2023-05-23 DIAGNOSIS — I10 Essential (primary) hypertension: Secondary | ICD-10-CM | POA: Diagnosis not present

## 2023-05-23 DIAGNOSIS — Z809 Family history of malignant neoplasm, unspecified: Secondary | ICD-10-CM | POA: Diagnosis not present

## 2023-05-23 DIAGNOSIS — Z818 Family history of other mental and behavioral disorders: Secondary | ICD-10-CM | POA: Diagnosis not present

## 2023-05-23 DIAGNOSIS — Z87440 Personal history of urinary (tract) infections: Secondary | ICD-10-CM | POA: Diagnosis not present

## 2023-05-23 DIAGNOSIS — Z008 Encounter for other general examination: Secondary | ICD-10-CM | POA: Diagnosis not present

## 2023-05-23 DIAGNOSIS — E785 Hyperlipidemia, unspecified: Secondary | ICD-10-CM | POA: Diagnosis not present

## 2023-05-23 DIAGNOSIS — G4730 Sleep apnea, unspecified: Secondary | ICD-10-CM | POA: Diagnosis not present

## 2023-05-23 DIAGNOSIS — M8580 Other specified disorders of bone density and structure, unspecified site: Secondary | ICD-10-CM | POA: Diagnosis not present

## 2023-05-23 DIAGNOSIS — M1990 Unspecified osteoarthritis, unspecified site: Secondary | ICD-10-CM | POA: Diagnosis not present

## 2023-05-23 DIAGNOSIS — Z823 Family history of stroke: Secondary | ICD-10-CM | POA: Diagnosis not present

## 2023-05-23 DIAGNOSIS — E039 Hypothyroidism, unspecified: Secondary | ICD-10-CM | POA: Diagnosis not present

## 2023-05-23 DIAGNOSIS — Z885 Allergy status to narcotic agent status: Secondary | ICD-10-CM | POA: Diagnosis not present

## 2023-05-28 DIAGNOSIS — Z1231 Encounter for screening mammogram for malignant neoplasm of breast: Secondary | ICD-10-CM | POA: Diagnosis not present

## 2023-06-06 ENCOUNTER — Ambulatory Visit (INDEPENDENT_AMBULATORY_CARE_PROVIDER_SITE_OTHER): Payer: Medicare HMO

## 2023-06-06 ENCOUNTER — Telehealth: Payer: Self-pay | Admitting: Urology

## 2023-06-06 DIAGNOSIS — M545 Low back pain, unspecified: Secondary | ICD-10-CM

## 2023-06-06 DIAGNOSIS — R399 Unspecified symptoms and signs involving the genitourinary system: Secondary | ICD-10-CM

## 2023-06-06 MED ORDER — NITROFURANTOIN MONOHYD MACRO 100 MG PO CAPS
100.0000 mg | ORAL_CAPSULE | Freq: Two times a day (BID) | ORAL | 0 refills | Status: AC
Start: 2023-06-06 — End: ?

## 2023-06-06 NOTE — Progress Notes (Signed)
Patient presents today with complaints of  UTI with lower back pain. UA and Culture done today.  Dr. Annabell Howells reviewed results and Macrobid BID x 7 days .  Patient aware of MD recommendations and that we will reach out with culture results.      [id number], CMA

## 2023-06-06 NOTE — Telephone Encounter (Signed)
Patient states she has a UTI sxs with lower back pain. Patient is advised to do a urine drop. Patient voiced understanding.

## 2023-06-06 NOTE — Telephone Encounter (Signed)
Patient left 2 message on the voicemail regarding having a UTI  -she took a dip stick test and she has an odor those are the only symptom she has.  The dipstick test shows positive on the leukocytes and negative on nitrates

## 2023-06-07 LAB — URINALYSIS, ROUTINE W REFLEX MICROSCOPIC
Bilirubin, UA: NEGATIVE
Glucose, UA: NEGATIVE
Ketones, UA: NEGATIVE
Nitrite, UA: NEGATIVE
Specific Gravity, UA: 1.01 (ref 1.005–1.030)
Urobilinogen, Ur: 0.2 mg/dL (ref 0.2–1.0)
pH, UA: 6.5 (ref 5.0–7.5)

## 2023-06-07 LAB — MICROSCOPIC EXAMINATION: WBC, UA: >30 /HPF — AB (ref 0–5)

## 2023-06-08 LAB — URINE CULTURE

## 2023-06-24 ENCOUNTER — Other Ambulatory Visit: Payer: Self-pay | Admitting: Family Medicine

## 2023-06-24 ENCOUNTER — Telehealth: Payer: Self-pay | Admitting: Family Medicine

## 2023-06-24 DIAGNOSIS — E7849 Other hyperlipidemia: Secondary | ICD-10-CM

## 2023-06-24 DIAGNOSIS — R7303 Prediabetes: Secondary | ICD-10-CM

## 2023-06-24 DIAGNOSIS — I10 Essential (primary) hypertension: Secondary | ICD-10-CM

## 2023-06-24 DIAGNOSIS — E039 Hypothyroidism, unspecified: Secondary | ICD-10-CM

## 2023-06-24 DIAGNOSIS — Z79899 Other long term (current) drug therapy: Secondary | ICD-10-CM

## 2023-06-24 DIAGNOSIS — E271 Primary adrenocortical insufficiency: Secondary | ICD-10-CM

## 2023-06-24 NOTE — Telephone Encounter (Signed)
A1c, metabolic 7, urine ACR, TSH free T4, cortisol, CBC, lipid, liver Adrenal insufficiency, prediabetes, hypertension, hyperlipidemia, hypothyroidism, high risk med

## 2023-06-24 NOTE — Telephone Encounter (Signed)
Patient has 6 month follow up on 11/1 and needing labs done.

## 2023-06-25 NOTE — Telephone Encounter (Signed)
See MyChart message

## 2023-06-27 ENCOUNTER — Ambulatory Visit: Payer: Medicare HMO | Admitting: Urology

## 2023-06-27 ENCOUNTER — Encounter: Payer: Self-pay | Admitting: Urology

## 2023-06-27 VITALS — BP 178/98 | HR 102 | Temp 97.7°F | Ht 60.5 in | Wt 159.0 lb

## 2023-06-27 DIAGNOSIS — R399 Unspecified symptoms and signs involving the genitourinary system: Secondary | ICD-10-CM

## 2023-06-27 DIAGNOSIS — N3281 Overactive bladder: Secondary | ICD-10-CM | POA: Diagnosis not present

## 2023-06-27 DIAGNOSIS — N390 Urinary tract infection, site not specified: Secondary | ICD-10-CM | POA: Insufficient documentation

## 2023-06-27 DIAGNOSIS — N281 Cyst of kidney, acquired: Secondary | ICD-10-CM | POA: Insufficient documentation

## 2023-06-27 DIAGNOSIS — Z87440 Personal history of urinary (tract) infections: Secondary | ICD-10-CM

## 2023-06-27 LAB — BLADDER SCAN AMB NON-IMAGING: Scan Result: 52

## 2023-06-27 MED ORDER — ESTRADIOL 0.1 MG/GM VA CREA
TOPICAL_CREAM | VAGINAL | 3 refills | Status: DC
Start: 2023-06-27 — End: 2024-08-14

## 2023-06-27 MED ORDER — NITROFURANTOIN MONOHYD MACRO 100 MG PO CAPS
100.0000 mg | ORAL_CAPSULE | Freq: Two times a day (BID) | ORAL | 0 refills | Status: AC
Start: 2023-06-27 — End: 2023-07-04

## 2023-06-27 NOTE — Patient Instructions (Signed)
Recommendations regarding UTI prevention / management:  When UTI symptoms occur: Call urology office to request order for urine culture. We recommend waiting for urine culture result prior to use of any antibiotics.  For bladder pain/ burning with urination: Over the counter Pyridium (phenazopyridine) as needed (commonly known under the "AZO" brand). No more than 3 days consecutively at a time due to risk for methemoglobinemia, liver function issues, and bone health damage with long term use of Pyridium.  Routine use for UTI prevention: - Topical vaginal estrogen for vaginal atrophy. Adequate fluid intake (>1.5 liters/day) to flush out the urinary tract. - Go to the bathroom to urinate every 4-6 hours while awake to minimize urinary stasis / bacterial overgrowth in the bladder. - Proanthocyanidin (PAC) supplement 36 mg daily; must be soluble (insoluble form of PAC will be ineffective). Recommended brand: Ellura. This is an over-the-counter supplement (often must be found/ purchased online) supplement derived from cranberries with concentrated active component: Proanthocyanidin (PAC) 36 mg daily. Decreases bacterial adherence to bladder lining. Not recommended for patients with interstitial cystitis due to acidity. - D-mannose powder (2 grams daily). This is an over-the-counter supplement which decreases bacterial adherence to bladder lining (it is a sugar that inhibits bacterial adherence to urothelial cells by binding to the pili of enteric bacteria). Take as per manufacturer recommendation. Can be used as an alternative or in addition to the concentrated cranberry supplement. Not recommended for diabetic patients due to sugar content. - Vitamin C supplement to acidify urine to minimize bacterial growth. Not recommended for patients with interstitial cystitis due to acidity. - Probiotic to maintain healthy vaginal microbiome to suppress bacteria at urethral opening. Brand recommendations: Darrold Junker  (includes probiotic & D-mannose ), Feminine Balance (highest concentration of lactobacillus) or Hyperbiotic Pro 15.

## 2023-06-27 NOTE — Progress Notes (Signed)
Name: Stacy Jensen DOB: 11-09-1955 MRN: [id number]  History of Present Illness: Ms. Mantz is a 67 y.o. female who presents today for follow up visit at [facility] [place]. - GU history: 1. Recurrent UTI. 2. OAB with urinary frequency, urgency, and urge incontinence. - Previously tried Oxybutynin; discontinued due to bothersome dry mouth.  - Previously tried Myrbetriq; didn't help and was expensive.  - Previously did pelvic floor physical therapy. - Pt aware to limit caffeine intake. 3. Right renal cyst.  Urine culture results in past 12 months: - 07/30/2022: Positive for >100k Streptococcus mitis/oralis group  - 10/04/2022: Positive for >100k E. coli - 02/15/2023: Positive for >100k E. coli - 03/22/2023: Positive for >100k E. coli - 05/13/2023: Positive for >100k E. coli - 06/06/2023: Negative  At last visit with Dr. Mena Goes on 05/13/2023: - Reported UTI symptoms. - The plan was:  1. Nitrofurantoin x5 days. 2. Follow up with [facility] in May 2025.  Today: She reports suspected UTI, primarily due to mildly increased fatigue. Denies dysuria, fevers, abdominal or flank pain, or acutely increased urgency/frequency (which she has at baseline due to OAB). Continues to wear pads for urge incontinence.    Fall Screening: Do you usually have a device to assist in your mobility? No   Medications: Current Outpatient Medications  Medication Sig Dispense Refill   acetaminophen (TYLENOL) 650 MG CR tablet Take 650 mg by mouth every 8 (eight) hours as needed for pain.     albuterol (VENTOLIN HFA) 108 (90 Base) MCG/ACT inhaler Inhale 2 puffs into the lungs every 6 (six) hours as needed for wheezing. 1 each 2   APPLE CIDER VINEGAR PO Take 30 mLs by mouth in the morning and at bedtime.     Aspirin-Acetaminophen-Caffeine (GOODYS EXTRA STRENGTH) 500-325-65 MG PACK Take 1 packet by mouth daily as needed (pain.).     B Complex-C (B-COMPLEX WITH VITAMIN C) tablet Take 1 tablet by mouth  every evening.     Calcium-Phosphorus-Vitamin D (CITRACAL +D3 PO) Take 1 tablet by mouth See admin instructions. Take 1 tablet by mouth twice daily on Sundays & Wednesdays. Take 1 tablet by mouth once daily on Mondays, Tuesdays, Thursdays, Fridays & Saturdays.     Coenzyme Q10 200 MG capsule Take 200 mg by mouth in the morning.     Cyanocobalamin (VITAMIN B-12 PO) Take 1 tablet by mouth in the morning.     estradiol (ESTRACE) 0.1 MG/GM vaginal cream Discard plastic applicator. Insert a blueberry size amount (approximately 1 gram) of cream on fingertip inside vagina at bedtime every night for 1 week then 2 nights per week for long term use. 30 g 3   Evolocumab (REPATHA) 140 MG/ML SOSY INJECT 1 ML SUBCUTANEOUSLY  EVERY TWO WEEKS 2 mL 5   famotidine (PEPCID) 20 MG tablet Take 20 mg by mouth in the morning.     hydrocortisone (CORTEF) 10 MG tablet TAKE 2 TABLETS BY MOUTH ONCE DAILY WITH FOOD 180 tablet 1   levothyroxine (SYNTHROID) 75 MCG tablet TAKE 1 TABLET BY MOUTH ONCE DAILY 5 DAYS PER WEEK 90 tablet 1   Magnesium 200 MG TABS Take 200 mg by mouth at bedtime.     Misc Natural Products (COLON CLEANSE) CAPS Take 1 capsule by mouth every evening.     Multiple Vitamins-Minerals (MULTIVITAMIN WITH MINERALS) tablet Take 1 tablet by mouth in the morning. Centrum Silver for Women 50+     nitrofurantoin, macrocrystal-monohydrate, (MACROBID) 100 MG capsule Take 1 capsule (100  mg total) by mouth 2 (two) times daily for 7 days. 14 capsule 0   Omega-3 Fatty Acids (FISH OIL TRIPLE STRENGTH) 1400 MG CAPS Take 1,400 mg by mouth in the morning.     valsartan (DIOVAN) 80 MG tablet Take 1 tablet (80 mg total) by mouth daily. 90 tablet 1   cetirizine (ZYRTEC) 10 MG tablet Take 10 mg by mouth at bedtime. (Patient not taking: Reported on 06/27/2023)     No current facility-administered medications for this visit.    Allergies: Allergies  Allergen Reactions   Codeine Nausea And Vomiting   Statins Other (See  Comments)    Body aches    Past Medical History:  Diagnosis Date   Anemia    Arthritis    Cushing's syndrome (HCC)    GERD (gastroesophageal reflux disease) 04/11/2020   Hypertension    Hypertriglyceridemia    Hypopituitarism (HCC) 09/29/2021   Had cushings syndrome with pituitary growth, then had pituitary excision    Osteoporosis    Pituitary Cushing syndrome (HCC)    tumor removed   Sleep apnea 2007   Past Surgical History:  Procedure Laterality Date   COLONOSCOPY WITH PROPOFOL N/A 01/18/2022   Procedure: COLONOSCOPY WITH PROPOFOL;  Surgeon: Malissa Hippo, MD;  Location: [facility];  Service: Endoscopy;  Laterality: N/A;  820   PITUITARY EXCISION     POLYPECTOMY  01/18/2022   Procedure: POLYPECTOMY;  Surgeon: Malissa Hippo, MD;  Location: [facility];  Service: Endoscopy;;   TUBAL LIGATION     Family History  Problem Relation Age of Onset   Hypertension Mother    Hypertension Father    Heart attack Father    Colon cancer Neg Hx    Social History   Socioeconomic History   Marital status: Married    Spouse name: Jillyn Hidden   Number of children: 4   Years of education: Not on file   Highest education level: Not on file  Occupational History   Not on file  Tobacco Use   Smoking status: Never   Smokeless tobacco: Never  Vaping Use   Vaping status: Never Used  Substance and Sexual Activity   Alcohol use: Never   Drug use: Never   Sexual activity: Yes  Other Topics Concern   Not on file  Social History Narrative   Married x 45 years 6/23.   17 grandchildren   11 great grandchildren   Social Determinants of Health   Financial Resource Strain: Low Risk  (12/07/2022)   Overall Financial Resource Strain (CARDIA)    Difficulty of Paying Living Expenses: Not hard at all  Food Insecurity: No Food Insecurity (12/07/2022)   Hunger Vital Sign    Worried About Running Out of Food in the Last Year: Never true    Ran Out of Food in the Last Year: Never true   Transportation Needs: No Transportation Needs (12/07/2022)   PRAPARE - Administrator, Civil Service (Medical): No    Lack of Transportation (Non-Medical): No  Physical Activity: Inactive (12/07/2022)   Exercise Vital Sign    Days of Exercise per Week: 0 days    Minutes of Exercise per Session: 0 min  Stress: No Stress Concern Present (12/07/2022)   [employer] of Occupational Health - Occupational Stress Questionnaire    Feeling of Stress : Only a little  Social Connections: Socially Integrated (12/07/2022)   Social Connection and Isolation Panel [NHANES]    Frequency of Communication with Friends  and Family: More than three times a week    Frequency of Social Gatherings with Friends and Family: More than three times a week    Attends Religious Services: More than 4 times per year    Active Member of [employer] or Organizations: Yes    Attends Engineer, structural: More than 4 times per year    Marital Status: Married  Catering manager Violence: Not At Risk (12/07/2022)   Humiliation, Afraid, Rape, and Kick questionnaire    Fear of Current or Ex-Partner: No    Emotionally Abused: No    Physically Abused: No    Sexually Abused: No    Review of Systems Constitutional: Patient denies any unintentional weight loss or change in strength; reports fatigue lntegumentary: Patient denies any rashes or pruritus Cardiovascular: Patient denies chest pain or syncope Respiratory: Patient denies shortness of breath Gastrointestinal: Patient denies nausea, vomiting, constipation, or diarrhea Musculoskeletal: Patient denies muscle cramps or weakness Neurologic: Patient denies convulsions or seizures Allergic/Immunologic: Patient denies recent allergic reaction(s) Hematologic/Lymphatic: Patient denies bleeding tendencies Endocrine: Patient denies heat/cold intolerance  GU: As per HPI.  OBJECTIVE Vitals:   06/27/23 1318 06/27/23 1319  BP: (!) 188/112 (!) 178/98  Pulse: 88  (!) 102  Temp: 97.7 F (36.5 C)    Body mass index is 30.54 kg/m.  Physical Examination Constitutional: No obvious distress; patient is non-toxic appearing  Cardiovascular: No visible lower extremity edema.  Respiratory: The patient does not have audible wheezing/stridor; respirations do not appear labored  Gastrointestinal: Abdomen non-distended Musculoskeletal: Normal ROM of UEs  Skin: No obvious rashes/open sores  Neurologic: CN 2-12 grossly intact Psychiatric: Answered questions appropriately with normal affect  Hematologic/Lymphatic/Immunologic: No obvious bruises or sites of spontaneous bleeding  UA: >30 WBC/hpf, 3-10 RBC/hpf, bacteria (few) PVR: 52 ml  ASSESSMENT UTI symptoms - Plan: Urinalysis, Routine w reflex microscopic, BLADDER SCAN AMB NON-IMAGING, nitrofurantoin, macrocrystal-monohydrate, (MACROBID) 100 MG capsule, Urine Culture  Recurrent UTI - Plan: Urinalysis, Routine w reflex microscopic, BLADDER SCAN AMB NON-IMAGING, nitrofurantoin, macrocrystal-monohydrate, (MACROBID) 100 MG capsule, estradiol (ESTRACE) 0.1 MG/GM vaginal cream, Urine Culture  Overactive bladder - Plan: Urinalysis, Routine w reflex microscopic, BLADDER SCAN AMB NON-IMAGING, Urine Culture  Acquired renal cyst of right kidney - Plan: Urinalysis, Routine w reflex microscopic, BLADDER SCAN AMB NON-IMAGING, Urine Culture  We discussed the possible etiologies of recurrent UTls including ascending infection related to intercourse; vaginal atrophy; transmural infection that has been treated incompletely; urinary tract stones; incomplete bladder emptying with urinary stasis; kidney or bladder tumor; urethral diverticulum; and colonization of  vagina and urinary tract with pathologic, adherent organisms.   - We discussed the difference between asymptomatic bacteriuria versus symptoms suggestive of a possible UTI which may include: fever, confusion, malaise, increased fatigue, dysuria, acute increase in  urinary frequency / urgency / urge incontinence. - Patient was advised that positive urine cultures only warrant acute antibiotic treatment if UTI symptoms are present. - In the absence of active UTI symptoms, a positive urine culture is considered to represent colonization of the urinary tract which does not warrant acute antibiotic treatment. - We discussed antibiotic stewardship and goal to minimize risk for developing antibiotic resistance.  Patient was advised that if/when UTI-like symptoms occur they can call our office to speak with a nurse, who can place an order for urinalysis (with reflex to urine culture). They may then proceed to a [facility] to provide their urine sample.   For UTI prevention we discussed options including: Adequate fluid intake (>  1.5 liters/day) to flush out the urinary tract. - Go to the bathroom to urinate every 4-6 hours while awake to minimize urinary stasis / bacterial overgrowth in the bladder. - Proanthocyanidin (PAC) supplement 36 mg daily; must be soluble (insoluble form of PAC will be ineffective). Recommend Ellura. D-mannose 2 g daily Vitamin C supplement Probiotic to maintain healthy vaginal microbiome - Topical vaginal estrogen for vaginal atrophy. The etiology and consequences of urogenital epithelial atrophy was explained to patient. The thinning of the epithelium of the urethra can contribute to urinary urgency and frequency syndromes. In addition, the normal bacterial flora that colonizes the perineum may contribute to UTI risk because the thin urethral epithelium allows the bacteria to become adherent and the change in vaginal pH can disrupt the vaginal / urethral microbiome and allow for bacterial overgrowth. Patient was advised that topical vaginal estrogen replacement will take about 3 months to restore the vaginal pH and may sting/burn initially due to severe dryness, which will improve with ongoing treatment. OK to have sex with any of  the topical vaginal estrogen replacement options. We discussed that there have been studies that evaluate use of low-dose intravaginal estrogen cream that shows minimal systemic absorption that is negligible after 3 weeks. There have been no studies indicating increased risk of contributing to breast cancer development or recurrence.  Patient decided to start topical vaginal estrogen cream and will consider OTC supplements for UTI prevention. Handout provided.  Pt verbalized understanding and agreement. All questions were answered.  PLAN Advised the following: 1. Urine culture. 2. Nitrofurantoin 100 mg 2x/day x7 days. 3. Start topical vaginal estrogen cream 2-3 nights per week routinely. 4. Return in 7 months (on 02/10/2024) for f/u with Dr. Mena Goes, as previously scheduled.  Orders Placed This Encounter  Procedures   Urine Culture   Urinalysis, Routine w reflex microscopic   BLADDER SCAN AMB NON-IMAGING   It has been explained that the patient is to follow regularly with their PCP in addition to all other providers involved in their care and to follow instructions provided by these respective offices. Patient advised to contact urology clinic if any urologic-pertaining questions, concerns, new symptoms or problems arise in the interim period.  Patient Instructions  Recommendations regarding UTI prevention / management:  When UTI symptoms occur: Call urology office to request order for urine culture. We recommend waiting for urine culture result prior to use of any antibiotics.  For bladder pain/ burning with urination: Over the counter Pyridium (phenazopyridine) as needed (commonly known under the "AZO" brand). No more than 3 days consecutively at a time due to risk for methemoglobinemia, liver function issues, and bone health damage with long term use of Pyridium.  Routine use for UTI prevention: - Topical vaginal estrogen for vaginal atrophy. Adequate fluid intake (>1.5 liters/day)  to flush out the urinary tract. - Go to the bathroom to urinate every 4-6 hours while awake to minimize urinary stasis / bacterial overgrowth in the bladder. - Proanthocyanidin (PAC) supplement 36 mg daily; must be soluble (insoluble form of PAC will be ineffective). Recommended brand: Ellura. This is an over-the-counter supplement (often must be found/ purchased online) supplement derived from cranberries with concentrated active component: Proanthocyanidin (PAC) 36 mg daily. Decreases bacterial adherence to bladder lining. Not recommended for patients with interstitial cystitis due to acidity. - D-mannose powder (2 grams daily). This is an over-the-counter supplement which decreases bacterial adherence to bladder lining (it is a sugar that inhibits bacterial adherence to urothelial cells by binding  to the pili of enteric bacteria). Take as per manufacturer recommendation. Can be used as an alternative or in addition to the concentrated cranberry supplement. Not recommended for diabetic patients due to sugar content. - Vitamin C supplement to acidify urine to minimize bacterial growth. Not recommended for patients with interstitial cystitis due to acidity. - Probiotic to maintain healthy vaginal microbiome to suppress bacteria at urethral opening. Brand recommendations: Darrold Junker (includes probiotic & D-mannose ), Feminine Balance (highest concentration of lactobacillus) or Hyperbiotic Pro 15.   Electronically signed by:  Donnita Falls, FNP   06/27/23    2:25 PM

## 2023-06-27 NOTE — Progress Notes (Signed)
post void residual=52 

## 2023-06-28 LAB — URINALYSIS, ROUTINE W REFLEX MICROSCOPIC
Bilirubin, UA: NEGATIVE
Glucose, UA: NEGATIVE
Ketones, UA: NEGATIVE
Nitrite, UA: NEGATIVE
Protein,UA: NEGATIVE
Specific Gravity, UA: 1.01 (ref 1.005–1.030)
Urobilinogen, Ur: 0.2 mg/dL (ref 0.2–1.0)
pH, UA: 6.5 (ref 5.0–7.5)

## 2023-06-28 LAB — MICROSCOPIC EXAMINATION: WBC, UA: >30 /[HPF] — AB (ref 0–5)

## 2023-06-29 LAB — URINE CULTURE

## 2023-07-01 ENCOUNTER — Telehealth: Payer: Self-pay

## 2023-07-01 NOTE — Telephone Encounter (Signed)
-----   Message from Donnita Falls sent at 07/01/2023  8:31 AM EDT ----- #Please call patient with this result# Negative urine culture, no antibiotic needed at this time. Thanks.

## 2023-07-01 NOTE — Telephone Encounter (Signed)
Patient is made aware and voiced understanding. 

## 2023-07-22 ENCOUNTER — Telehealth: Payer: Self-pay

## 2023-07-22 NOTE — Telephone Encounter (Signed)
Pt has appt Friday and needs blood work ordered before her appt Friday    [phone] [facility]

## 2023-07-23 NOTE — Telephone Encounter (Signed)
All labs in the orders from Oct 1 to be done ( was already ordered)

## 2023-07-25 DIAGNOSIS — E039 Hypothyroidism, unspecified: Secondary | ICD-10-CM | POA: Diagnosis not present

## 2023-07-25 DIAGNOSIS — I10 Essential (primary) hypertension: Secondary | ICD-10-CM | POA: Diagnosis not present

## 2023-07-25 DIAGNOSIS — Z79899 Other long term (current) drug therapy: Secondary | ICD-10-CM | POA: Diagnosis not present

## 2023-07-25 DIAGNOSIS — E7849 Other hyperlipidemia: Secondary | ICD-10-CM | POA: Diagnosis not present

## 2023-07-25 DIAGNOSIS — E271 Primary adrenocortical insufficiency: Secondary | ICD-10-CM | POA: Diagnosis not present

## 2023-07-25 DIAGNOSIS — R7303 Prediabetes: Secondary | ICD-10-CM | POA: Diagnosis not present

## 2023-07-26 ENCOUNTER — Ambulatory Visit (INDEPENDENT_AMBULATORY_CARE_PROVIDER_SITE_OTHER): Payer: Medicare HMO | Admitting: Family Medicine

## 2023-07-26 VITALS — BP 130/70 | HR 87 | Temp 98.2°F | Ht 60.05 in | Wt 149.0 lb

## 2023-07-26 DIAGNOSIS — E7849 Other hyperlipidemia: Secondary | ICD-10-CM | POA: Diagnosis not present

## 2023-07-26 DIAGNOSIS — E039 Hypothyroidism, unspecified: Secondary | ICD-10-CM | POA: Diagnosis not present

## 2023-07-26 DIAGNOSIS — M8580 Other specified disorders of bone density and structure, unspecified site: Secondary | ICD-10-CM

## 2023-07-26 LAB — CBC WITH DIFFERENTIAL/PLATELET
Basophils Absolute: 0.1 10*3/uL (ref 0.0–0.2)
Basos: 1 %
EOS (ABSOLUTE): 0.2 10*3/uL (ref 0.0–0.4)
Eos: 3 %
Hematocrit: 50.5 % — ABNORMAL HIGH (ref 34.0–46.6)
Hemoglobin: 16.1 g/dL — ABNORMAL HIGH (ref 11.1–15.9)
Immature Grans (Abs): 0 10*3/uL (ref 0.0–0.1)
Immature Granulocytes: 0 %
Lymphocytes Absolute: 3.1 10*3/uL (ref 0.7–3.1)
Lymphs: 42 %
MCH: 30 pg (ref 26.6–33.0)
MCHC: 31.9 g/dL (ref 31.5–35.7)
MCV: 94 fL (ref 79–97)
Monocytes Absolute: 0.6 10*3/uL (ref 0.1–0.9)
Monocytes: 8 %
Neutrophils Absolute: 3.4 10*3/uL (ref 1.4–7.0)
Neutrophils: 46 %
Platelets: 197 10*3/uL (ref 150–450)
RBC: 5.37 x10E6/uL — ABNORMAL HIGH (ref 3.77–5.28)
RDW: 14.5 % (ref 11.7–15.4)
WBC: 7.3 10*3/uL (ref 3.4–10.8)

## 2023-07-26 LAB — TSH+FREE T4
Free T4: 1.17 ng/dL (ref 0.82–1.77)
TSH: 3.41 u[IU]/mL (ref 0.450–4.500)

## 2023-07-26 LAB — BASIC METABOLIC PANEL (7)
BUN/Creatinine Ratio: 10 — ABNORMAL LOW (ref 12–28)
BUN: 11 mg/dL (ref 8–27)
CO2: 26 mmol/L (ref 20–29)
Chloride: 106 mmol/L (ref 96–106)
Creatinine, Ser: 1.12 mg/dL — ABNORMAL HIGH (ref 0.57–1.00)
Glucose: 100 mg/dL — ABNORMAL HIGH (ref 70–99)
Potassium: 4 mmol/L (ref 3.5–5.2)
Sodium: 146 mmol/L — ABNORMAL HIGH (ref 134–144)
eGFR: 53 mL/min/{1.73_m2} — ABNORMAL LOW (ref >59)

## 2023-07-26 LAB — HEPATIC FUNCTION PANEL
ALT: 21 [IU]/L (ref 0–32)
AST: 26 [IU]/L (ref 0–40)
Albumin: 4.2 g/dL (ref 3.9–4.9)
Alkaline Phosphatase: 83 [IU]/L (ref 44–121)
Bilirubin Total: 0.4 mg/dL (ref 0.0–1.2)
Bilirubin, Direct: 0.14 mg/dL (ref 0.00–0.40)
Total Protein: 7.2 g/dL (ref 6.0–8.5)

## 2023-07-26 LAB — LIPID PANEL
Chol/HDL Ratio: 3.9 {ratio} (ref 0.0–4.4)
Cholesterol, Total: 213 mg/dL — ABNORMAL HIGH (ref 100–199)
HDL: 54 mg/dL (ref >39)
LDL Chol Calc (NIH): 123 mg/dL — ABNORMAL HIGH (ref 0–99)
Triglycerides: 207 mg/dL — ABNORMAL HIGH (ref 0–149)
VLDL Cholesterol Cal: 36 mg/dL (ref 5–40)

## 2023-07-26 LAB — HEMOGLOBIN A1C
Est. average glucose Bld gHb Est-mCnc: 128 mg/dL
Hgb A1c MFr Bld: 6.1 % — ABNORMAL HIGH (ref 4.8–5.6)

## 2023-07-26 LAB — CORTISOL: Cortisol: 11.4 ug/dL (ref 6.2–19.4)

## 2023-07-26 LAB — MICROALBUMIN / CREATININE URINE RATIO
Creatinine, Urine: 65.7 mg/dL
Microalb/Creat Ratio: 32 mg/g{creat} — ABNORMAL HIGH (ref 0–29)
Microalbumin, Urine: 21.1 ug/mL

## 2023-07-26 MED ORDER — AMLODIPINE BESYLATE 2.5 MG PO TABS
2.5000 mg | ORAL_TABLET | Freq: Every day | ORAL | 1 refills | Status: DC
Start: 2023-07-26 — End: 2024-01-23

## 2023-07-26 MED ORDER — HYDROCORTISONE 10 MG PO TABS: ORAL_TABLET | ORAL | 1 refills | Status: DC

## 2023-07-26 MED ORDER — LEVOTHYROXINE SODIUM 88 MCG PO TABS: ORAL_TABLET | ORAL | Status: DC

## 2023-07-26 NOTE — Progress Notes (Signed)
Subjective:    Patient ID: Stacy Jensen, female    DOB: 1955-10-22, 67 y.o.   MRN: [id number]  Discussed the use of AI scribe software for clinical note transcription with the patient, who gave verbal consent to proceed.  History of Present Illness   The patient, with a history of Cushing's disease, adrenal insufficiency, and thyroid disease, presents with recurrent urinary tract infections (UTIs). She reports frequent urination and has been seeing a urologist every three months. She also mentions a potential kidney stone, but it is not causing any problems currently.  The patient has been receiving a flu shot and a COVID-19 vaccine. She reports a history of getting sick every year after Thanksgiving, lasting until January.  The patient is also due for a bone density infusion, which she has not had in approximately three years. She has recently had a mammogram [facility], which came back normal.  The patient is on thyroid medication, which was recently increased. She also takes hydrocortisone, which was recently decreased. She reports needing a refill on the hydrocortisone as she only has one day's worth left. She was previously on valsartan, but stopped taking it as it made her feel unwell and did not seem to affect her blood pressure.  The patient is trying to maintain a healthy diet and incorporate walking into her daily routine. She has recently acquired a scooter to assist with mobility at events such as car shows. She reports occasional ankle issues.  She has also been taking a cholesterol medication, and her LDL levels have improved compared to previous years. She has been diagnosed with prediabetes, with an A1c of 6.1.  The patient's liver function and cortisol levels are normal, but she has mild chronic kidney disease. She has also had blood work done recently, the results of which are pending.         Review of Systems     Objective:    Physical Exam   VITALS: BP-  144/88 CARDIOVASCULAR: Heart sounds normal     General-in no acute distress Eyes-no discharge Lungs-respiratory rate normal, CTA CV-no murmurs,RRR Extremities skin warm dry no edema Neuro grossly normal Behavior normal, alert'      Assessment & Plan:  Assessment and Plan    Frequent Urinary Tract Infections Patient reports frequent UTIs and is under the care of a urologist, Dr. Mena Goes, with a follow-up appointment scheduled in May. -No changes to current management plan discussed.  Osteoporosis It has been three years since the last Reclast infusion. -Initiate process for approval of Reclast infusion.  Hypothyroidism Patient is on Levothyroxine daily, recently increased by endocrinologist, Dr. Providence Crosby. -Continue current medication regimen.  Adrenal Insufficiency Patient is on Hydrocortisone 10mg  in the morning and 5mg  in the evening, recently decreased by endocrinologist, Dr. Providence Crosby. Patient reports running low on medication. -Send refill for Hydrocortisone to [facility], [place].  Hypertension Patient reports discontinuing Valsartan due to lack of perceived benefit and adverse effects. Blood pressure readings in the office are elevated. -Initiate Amlodipine at a low dose. Patient to monitor blood pressure at home and report readings.  Prediabetes A1C is 6.1, up from 5.9, placing the patient in the prediabetic range. -Continue with healthy eating and activity.  Mild Chronic Kidney Disease Creatinine is 1.12 and GFR is 53, indicating mild chronic kidney disease, likely secondary to hypertension. -No changes to current management plan discussed.  Hyperlipidemia LDL is 123, above the desired level of less than 100. Patient is on biweekly  injections for cholesterol management. -Continue current medication regimen.  General Health Maintenance -Completed flu and COVID vaccinations. -Normal mammogram at [facility]. -Normal peripheral artery disease  screening. -Plan to follow up in late spring, before the urology appointment.     1. Hypothyroidism, unspecified type Continue thyroid as is - levothyroxine (SYNTHROID) 88 MCG tablet; TAKE 1 TABLET BY MOUTH ONCE DAILY 5  DAYS  PER  WEEK  2. Osteopenia, unspecified location Reinitiate Reclast yearly basis  3. Other hyperlipidemia Hyperlipidemia good control continue current medications under reasonable control given where she was

## 2023-09-09 ENCOUNTER — Telehealth: Payer: Self-pay | Admitting: *Deleted

## 2023-09-09 NOTE — Telephone Encounter (Signed)
Copied from CRM [id number]. Topic: Clinical - Request for Lab/Test Order >> Sep 09, 2023  3:22 PM Dennison Nancy wrote: Reason for CRM: patient request for the shot for cholesterol name repatha , not sure what the process would be to get the shot ,last time had one had last year .at the [facility] with a pharmacist  Please call patient on her house phone at [phone]  , can leave a message if need to .

## 2023-09-10 NOTE — Telephone Encounter (Signed)
I am certainly open for her to continue Repatha but I believe she is seeing if there can be prescription assistance for this So therefore I would recommend touching base with the patient to verify this then if that is the case touch base with Raynelle Fanning to see what she could qualify for thank you

## 2023-09-13 ENCOUNTER — Encounter: Payer: Self-pay | Admitting: Pharmacist

## 2023-09-13 ENCOUNTER — Telehealth: Payer: Self-pay | Admitting: Pharmacist

## 2023-09-13 NOTE — Telephone Encounter (Signed)
   Patient enrolled in the [facility] grant for hypercholesteremia Medicare access (Repatha).  Re-enrollment submitted online by PharmD. Patient enrolled for 2025.  Paperwork mailed to patient's current address.  Grant effective until 09/30/2024     Kieth Brightly, PharmD, BCACP, CPP Clinical Pharmacist, [facility]

## 2023-09-16 NOTE — Telephone Encounter (Signed)
Stacy Jensen, [facility]     Patient enrolled in the [facility] grant for hypercholesteremia Medicare access (Repatha).  Re-enrollment submitted online by PharmD. Patient enrolled for 2025.  Paperwork mailed to patient's current address.  Grant effective until 09/30/2024  Will send my chart to patient.  Raynelle Fanning

## 2023-10-07 ENCOUNTER — Other Ambulatory Visit: Payer: Self-pay | Admitting: Family Medicine

## 2023-11-04 DIAGNOSIS — N390 Urinary tract infection, site not specified: Secondary | ICD-10-CM | POA: Diagnosis not present

## 2023-11-04 DIAGNOSIS — R030 Elevated blood-pressure reading, without diagnosis of hypertension: Secondary | ICD-10-CM | POA: Diagnosis not present

## 2023-11-09 ENCOUNTER — Other Ambulatory Visit: Payer: Self-pay | Admitting: Family Medicine

## 2023-12-07 ENCOUNTER — Other Ambulatory Visit: Payer: Self-pay | Admitting: Family Medicine

## 2023-12-13 ENCOUNTER — Ambulatory Visit: Payer: Medicare HMO

## 2023-12-13 VITALS — BP 130/70 | Ht 60.0 in | Wt 148.0 lb

## 2023-12-13 DIAGNOSIS — Z0000 Encounter for general adult medical examination without abnormal findings: Secondary | ICD-10-CM

## 2023-12-13 DIAGNOSIS — Z1231 Encounter for screening mammogram for malignant neoplasm of breast: Secondary | ICD-10-CM

## 2023-12-13 NOTE — Progress Notes (Signed)
 Because this visit was a virtual/telehealth visit,  certain criteria was not obtained, such a blood pressure, CBG if applicable, and timed get up and go. Any medications not marked as "taking" were not mentioned during the medication reconciliation part of the visit. Any vitals not documented were not able to be obtained due to this being a telehealth visit or patient was unable to self-report a recent blood pressure reading due to a lack of equipment at home via telehealth. Vitals that have been documented are verbally provided by the patient.   Subjective:   Stacy Jensen is a 68 y.o. who presents for a Medicare Wellness preventive visit.  Visit Complete: Virtual I connected with  JALAINA SALYERS on 12/13/23 by a audio enabled telemedicine application and verified that I am speaking with the correct person using two identifiers.  Patient Location: Home  Provider Location: Home Office  I discussed the limitations of evaluation and management by telemedicine. The patient expressed understanding and agreed to proceed.  Vital Signs: Because this visit was a virtual/telehealth visit, some criteria may be missing or patient reported. Any vitals not documented were not able to be obtained and vitals that have been documented are patient reported.  VideoDeclined- This patient declined Librarian, academic. Therefore the visit was completed with audio only.  Persons Participating in Visit: Patient.  AWV Questionnaire: No: Patient Medicare AWV questionnaire was not completed prior to this visit.  Cardiac Risk Factors include: advanced age (>71men, >72 women);dyslipidemia;hypertension     Objective:    Today's Vitals   12/13/23 0819  BP: 130/70  Weight: 148 lb (67.1 kg)  Height: 5' (1.524 m)   Body mass index is 28.9 kg/m.     12/13/2023    8:18 AM 12/07/2022   12:09 PM 12/07/2022    9:08 AM 01/18/2022    7:04 AM 11/28/2021    8:38 AM  Advanced Directives  Does  Patient Have a Medical Advance Directive? No No No No No  Would patient like information on creating a medical advance directive? No - Patient declined No - Patient declined No - Patient declined No - Patient declined No - Patient declined    Current Medications (verified) Outpatient Encounter Medications as of 12/13/2023  Medication Sig   acetaminophen (TYLENOL) 650 MG CR tablet Take 650 mg by mouth every 8 (eight) hours as needed for pain.   albuterol (VENTOLIN HFA) 108 (90 Base) MCG/ACT inhaler Inhale 2 puffs into the lungs every 6 (six) hours as needed for wheezing.   amLODipine (NORVASC) 2.5 MG tablet Take 1 tablet (2.5 mg total) by mouth daily.   APPLE CIDER VINEGAR PO Take 30 mLs by mouth in the morning and at bedtime.   Aspirin-Acetaminophen-Caffeine (GOODYS EXTRA STRENGTH) 500-325-65 MG PACK Take 1 packet by mouth daily as needed (pain.).   B Complex-C (B-COMPLEX WITH VITAMIN C) tablet Take 1 tablet by mouth every evening.   Calcium-Phosphorus-Vitamin D (CITRACAL +D3 PO) Take 1 tablet by mouth See admin instructions. Take 1 tablet by mouth twice daily on Sundays & Wednesdays. Take 1 tablet by mouth once daily on Mondays, Tuesdays, Thursdays, Fridays & Saturdays.   Coenzyme Q10 200 MG capsule Take 200 mg by mouth in the morning.   Cyanocobalamin (VITAMIN B-12 PO) Take 1 tablet by mouth in the morning.   estradiol (ESTRACE) 0.1 MG/GM vaginal cream Discard plastic applicator. Insert a blueberry size amount (approximately 1 gram) of cream on fingertip inside vagina at bedtime every night  for 1 week then 2 nights per week for long term use.   famotidine (PEPCID) 20 MG tablet Take 20 mg by mouth in the morning.   hydrocortisone (CORTEF) 10 MG tablet One qam and 1/2 in the evening   levothyroxine (SYNTHROID) 88 MCG tablet TAKE 1 TABLET BY MOUTH ONCE DAILY 5  DAYS  PER  WEEK   Magnesium 200 MG TABS Take 200 mg by mouth at bedtime.   Misc Natural Products (COLON CLEANSE) CAPS Take 1 capsule by  mouth every evening.   Multiple Vitamins-Minerals (MULTIVITAMIN WITH MINERALS) tablet Take 1 tablet by mouth in the morning. Centrum Silver for Women 50+   Omega-3 Fatty Acids (FISH OIL TRIPLE STRENGTH) 1400 MG CAPS Take 1,400 mg by mouth in the morning.   REPATHA 140 MG/ML SOSY INJECT 1 ML SUBCUTANEOUSLY  EVERY TWO WEEKS   cetirizine (ZYRTEC) 10 MG tablet Take 10 mg by mouth at bedtime. (Patient not taking: Reported on 06/27/2023)   No facility-administered encounter medications on file as of 12/13/2023.    Allergies (verified) Codeine and Statins   History: Past Medical History:  Diagnosis Date   Anemia    Arthritis    Cushing's syndrome (HCC)    GERD (gastroesophageal reflux disease) 04/11/2020   Hypertension    Hypertriglyceridemia    Hypopituitarism (HCC) 09/29/2021   Had cushings syndrome with pituitary growth, then had pituitary excision    Osteoporosis    Pituitary Cushing syndrome (HCC)    tumor removed   Sleep apnea 2007   Past Surgical History:  Procedure Laterality Date   COLONOSCOPY WITH PROPOFOL N/A 01/18/2022   Procedure: COLONOSCOPY WITH PROPOFOL;  Surgeon: Malissa Hippo, MD;  Location: [facility];  Service: Endoscopy;  Laterality: N/A;  820   PITUITARY EXCISION     POLYPECTOMY  01/18/2022   Procedure: POLYPECTOMY;  Surgeon: Malissa Hippo, MD;  Location: [facility];  Service: Endoscopy;;   TUBAL LIGATION     Family History  Problem Relation Age of Onset   Hypertension Mother    Hypertension Father    Heart attack Father    Colon cancer Neg Hx    Social History   Socioeconomic History   Marital status: Married    Spouse name: Jillyn Hidden   Number of children: 4   Years of education: Not on file   Highest education level: 12th grade  Occupational History   Not on file  Tobacco Use   Smoking status: Never   Smokeless tobacco: Never  Vaping Use   Vaping status: Never Used  Substance and Sexual Activity   Alcohol use: Never   Drug use: Never    Sexual activity: Yes  Other Topics Concern   Not on file  Social History Narrative   Married x 45 years 6/23.   17 grandchildren   11 great grandchildren   Social Drivers of Corporate investment banker Strain: Low Risk  (12/12/2023)   Overall Financial Resource Strain (CARDIA)    Difficulty of Paying Living Expenses: Not hard at all  Food Insecurity: No Food Insecurity (12/13/2023)   Hunger Vital Sign    Worried About Running Out of Food in the Last Year: Never true    Ran Out of Food in the Last Year: Never true  Transportation Needs: No Transportation Needs (12/12/2023)   PRAPARE - Administrator, Civil Service (Medical): No    Lack of Transportation (Non-Medical): No  Physical Activity: Insufficiently Active (12/12/2023)  Exercise Vital Sign    Days of Exercise per Week: 1 day    Minutes of Exercise per Session: 20 min  Stress: No Stress Concern Present (12/12/2023)   [employer] of Occupational Health - Occupational Stress Questionnaire    Feeling of Stress : Only a little  Social Connections: Socially Integrated (12/12/2023)   Social Connection and Isolation Panel [NHANES]    Frequency of Communication with Friends and Family: More than three times a week    Frequency of Social Gatherings with Friends and Family: Twice a week    Attends Religious Services: More than 4 times per year    Active Member of [employer] or Organizations: Yes    Attends Engineer, structural: Not on file    Marital Status: Married    Tobacco Counseling Counseling given: Not Answered    Clinical Intake:  Pre-visit preparation completed: Yes  Pain : No/denies pain     BMI - recorded: 28.9 Nutritional Status: BMI 25 -29 Overweight Nutritional Risks: None Diabetes: No  Lab Results  Component Value Date   HGBA1C 6.1 (H) 07/25/2023   HGBA1C 5.9 (H) 10/09/2022   HGBA1C 5.6 05/15/2022        Interpreter Needed?: No  Information entered by :: Leena Tiede   Activities of Daily Living:    12/13/2023    8:30 AM  In your present state of health, do you have any difficulty performing the following activities:  Hearing? 1  Comment yes wears hearing aid  Vision? 1  Comment sometimes  Difficulty concentrating or making decisions? 1  Comment sometimes  Walking or climbing stairs? 1  Comment sometimes use a cane  Dressing or bathing? 0  Doing errands, shopping? 0  Preparing Food and eating ? N  Using the Toilet? N  In the past six months, have you accidently leaked urine? Y  Comment patient states she usually wears pads for it  Do you have problems with loss of bowel control? Y  Comment yes and she carry extra clothes in the car  Managing your Medications? N  Managing your Finances? N  Housekeeping or managing your Housekeeping? N    Patient Care Team: Babs Sciara, MD as PCP - General (Family Medicine) Danella Maiers, [facility] as Pharmacist (Family Medicine) Dorisann Frames, MD as Referring Physician (Endocrinology) Jerilee Field, MD as Consulting Physician (Urology)  Indicate any recent Medical Services you may have received from other than [facility] providers in the past year (date may be approximate).     Assessment:   This is a routine wellness examination for [place].  Hearing/Vision screen Hearing Screening - Comments:: Patient has hearing aid in right ear. [place].  Vision Screening - Comments:: Patient wears glasses and contacts  [facility] in [place].   Goals Addressed             This Visit's Progress    Patient Stated       Patient want to see all kids and grandkids       Depression Screen     12/13/2023    8:33 AM 07/26/2023   11:01 AM 12/07/2022   12:08 PM 10/15/2022   11:13 AM 01/03/2022   11:01 AM 11/28/2021    8:34 AM 03/29/2021    9:55 AM  PHQ 2/9 Scores  PHQ - 2 Score 3 1 0 0 0 0 1  PHQ- 9 Score 7 5  1        Fall Risk     12/13/2023  8:27 AM 07/26/2023   11:01 AM 12/07/2022    9:07 AM  01/03/2022   11:01 AM 11/28/2021    8:39 AM  Fall Risk   Falls in the past year? 0 0 1 0 1  Number falls in past yr: 0  0  0  Injury with Fall? 0  0  0  Risk for fall due to : Impaired mobility  No Fall Risks No Fall Risks Impaired balance/gait;Impaired mobility  Risk for fall due to: Comment cane      Follow up Falls evaluation completed  Falls prevention discussed;Education provided;Falls evaluation completed Falls evaluation completed Falls prevention discussed    MEDICARE RISK AT HOME:  Medicare Risk at Home Any stairs in or around the home?: Yes If so, are there any without handrails?: No Home free of loose throw rugs in walkways, pet beds, electrical cords, etc?: Yes Adequate lighting in your home to reduce risk of falls?: Yes Life alert?: No Use of a cane, walker or w/c?: Yes (cane) Grab bars in the bathroom?: No (patient states she is working on it) Chartered loss adjuster in shower?: No (patient working on it) Elevated toilet seat or a handicapped toilet?: No  TIMED UP AND GO:  Was the test performed?  No  Cognitive Function: 6CIT completed        12/13/2023    8:23 AM 12/07/2022    9:10 AM 11/28/2021    8:43 AM  6CIT Screen  What Year? 0 points 0 points 0 points  What month? 0 points 0 points 0 points  What time? 0 points 0 points 0 points  Count back from 20 0 points 0 points 0 points  Months in reverse 0 points 0 points 0 points  Repeat phrase 0 points 0 points 0 points  Total Score 0 points 0 points 0 points    Immunizations Immunization History  Administered Date(s) Administered   Fluad Quad(high Dose 65+) 07/13/2019, 06/12/2022   Influenza, High Dose Seasonal PF 07/21/2023   Influenza, Quadrivalent, Recombinant, Inj, Pf 07/07/2018   Influenza-Unspecified 09/24/2006, 06/14/2016, 06/14/2017, 07/07/2018   PNEUMOCOCCAL CONJUGATE-20 12/13/2021   Pfizer(Comirnaty)Fall Seasonal Vaccine 12 years and older 07/21/2023   Pneumococcal Polysaccharide-23 09/04/2013    Td 07/16/2017, 12/13/2021   Zoster Recombinant(Shingrix) 12/20/2022, 04/08/2023    Screening Tests Health Maintenance  Topic Date Due   MAMMOGRAM  05/25/2023   Medicare Annual Wellness (AWV)  12/07/2023   Colonoscopy  01/19/2027   DTaP/Tdap/Td (3 - Tdap) 12/14/2031   Pneumonia Vaccine 36+ Years old  Completed   INFLUENZA VACCINE  Completed   DEXA SCAN  Completed   Hepatitis C Screening  Completed   Zoster Vaccines- Shingrix  Completed   HPV VACCINES  Aged Out   COVID-19 Vaccine  Discontinued    Health Maintenance  Health Maintenance Due  Topic Date Due   MAMMOGRAM  05/25/2023   Medicare Annual Wellness (AWV)  12/07/2023   Health Maintenance Items Addressed: Mammogram ordered  Additional Screening:  Vision Screening: Recommended annual ophthalmology exams for early detection of glaucoma and other disorders of the eye.  Dental Screening: Recommended annual dental exams for proper oral hygiene  Community Resource Referral / Chronic Care Management: CRR required this visit?  No   CCM required this visit?  No     Plan:     I have personally reviewed and noted the following in the patient's chart:   Medical and social history Use of alcohol, tobacco or illicit drugs  Current medications  and supplements including opioid prescriptions. Patient is not currently taking opioid prescriptions. Functional ability and status Nutritional status Physical activity Advanced directives List of other physicians Hospitalizations, surgeries, and ER visits in previous 12 months Vitals Screenings to include cognitive, depression, and falls Referrals and appointments  In addition, I have reviewed and discussed with patient certain preventive protocols, quality metrics, and best practice recommendations. A written personalized care plan for preventive services as well as general preventive health recommendations were provided to patient.     Rudi Heap, [place]   12/13/2023    After Visit Summary: (MyChart) Due to this being a telephonic visit, the after visit summary with patients personalized plan was offered to patient via MyChart   Notes: Nothing significant to report at this time.

## 2023-12-13 NOTE — Patient Instructions (Signed)
 Stacy Jensen , Thank you for taking time to come for your Medicare Wellness Visit. I appreciate your ongoing commitment to your health goals. Please review the following plan we discussed and let me know if I can assist you in the future.   Referrals/Orders/Follow-Ups/Clinician Recommendations: You have an order for:  []   3D Mammogram      Please call for appointment: [facility] in [place]  Make sure to wear two-piece clothing.  No lotions, powders, or deodorants the day of the appointment. Make sure to bring picture ID and insurance card.  Bring list of medications you are currently taking including any supplements.    This is a list of the screening recommended for you and due dates:  Health Maintenance  Topic Date Due   Mammogram  05/25/2023   Medicare Annual Wellness Visit  12/07/2023   Colon Cancer Screening  01/19/2027   DTaP/Tdap/Td vaccine (3 - Tdap) 12/14/2031   Pneumonia Vaccine  Completed   Flu Shot  Completed   DEXA scan (bone density measurement)  Completed   Hepatitis C Screening  Completed   Zoster (Shingles) Vaccine  Completed   HPV Vaccine  Aged Out   COVID-19 Vaccine  Discontinued    Advanced directives: (Declined) Advance directive discussed with you today. Even though you declined this today, please call our office should you change your mind, and we can give you the proper paperwork for you to fill out.  Next Medicare Annual Wellness Visit scheduled for next year: Yes

## 2023-12-17 ENCOUNTER — Other Ambulatory Visit: Payer: Self-pay

## 2023-12-17 DIAGNOSIS — E039 Hypothyroidism, unspecified: Secondary | ICD-10-CM

## 2023-12-17 MED ORDER — LEVOTHYROXINE SODIUM 88 MCG PO TABS
ORAL_TABLET | ORAL | 2 refills | Status: DC
Start: 2023-12-17 — End: 2024-01-23

## 2024-01-04 ENCOUNTER — Other Ambulatory Visit: Payer: Self-pay | Admitting: Family Medicine

## 2024-01-15 ENCOUNTER — Telehealth: Payer: Self-pay

## 2024-01-15 NOTE — Telephone Encounter (Signed)
 Please order lipid, liver, CBC, cortisol, TSH free T4, urine ACR, metabolic 70, A1c Hypertension, hyperlipidemia, prediabetes, polycythemia, hypothyroidism, adrenal insufficiency

## 2024-01-15 NOTE — Telephone Encounter (Signed)
 Please advise as to what labs you would like ordered   Copied from CRM [id number]. Topic: Clinical - Request for Lab/Test Order >> Jan 15, 2024  1:24 PM Star [place] wrote: Reason for CRM: Patient asking for lab orders before appt on 5/1, please call [phone]

## 2024-01-17 ENCOUNTER — Other Ambulatory Visit: Payer: Self-pay

## 2024-01-17 ENCOUNTER — Telehealth: Payer: Self-pay | Admitting: Family Medicine

## 2024-01-17 DIAGNOSIS — D751 Secondary polycythemia: Secondary | ICD-10-CM

## 2024-01-17 DIAGNOSIS — E7849 Other hyperlipidemia: Secondary | ICD-10-CM

## 2024-01-17 DIAGNOSIS — R7303 Prediabetes: Secondary | ICD-10-CM

## 2024-01-17 DIAGNOSIS — E271 Primary adrenocortical insufficiency: Secondary | ICD-10-CM

## 2024-01-17 DIAGNOSIS — E039 Hypothyroidism, unspecified: Secondary | ICD-10-CM

## 2024-01-17 NOTE — Telephone Encounter (Signed)
 Patient is wanting to know if she needs labs before appointment on 5/1. Came to office today stating no lab papers at lab [employer]

## 2024-01-19 DIAGNOSIS — N390 Urinary tract infection, site not specified: Secondary | ICD-10-CM | POA: Diagnosis not present

## 2024-01-20 DIAGNOSIS — E7849 Other hyperlipidemia: Secondary | ICD-10-CM | POA: Diagnosis not present

## 2024-01-20 DIAGNOSIS — E271 Primary adrenocortical insufficiency: Secondary | ICD-10-CM | POA: Diagnosis not present

## 2024-01-20 DIAGNOSIS — E039 Hypothyroidism, unspecified: Secondary | ICD-10-CM | POA: Diagnosis not present

## 2024-01-20 DIAGNOSIS — D751 Secondary polycythemia: Secondary | ICD-10-CM | POA: Diagnosis not present

## 2024-01-20 DIAGNOSIS — R7303 Prediabetes: Secondary | ICD-10-CM | POA: Diagnosis not present

## 2024-01-20 NOTE — Telephone Encounter (Signed)
 It does appear labs were put in on 425 please send patient MyChart message letting her know this has been completed thank you

## 2024-01-20 NOTE — Telephone Encounter (Signed)
 Pt sent mychart message and will have labs done

## 2024-01-21 LAB — LIPID PANEL
Chol/HDL Ratio: 4.6 ratio — ABNORMAL HIGH (ref 0.0–4.4)
Cholesterol, Total: 238 mg/dL — ABNORMAL HIGH (ref 100–199)
HDL: 52 mg/dL (ref >39)
LDL Chol Calc (NIH): 131 mg/dL — ABNORMAL HIGH (ref 0–99)
Triglycerides: 312 mg/dL — ABNORMAL HIGH (ref 0–149)
VLDL Cholesterol Cal: 55 mg/dL — ABNORMAL HIGH (ref 5–40)

## 2024-01-21 LAB — CBC
Hematocrit: 48.9 % — ABNORMAL HIGH (ref 34.0–46.6)
Hemoglobin: 16.4 g/dL — ABNORMAL HIGH (ref 11.1–15.9)
MCH: 31.4 pg (ref 26.6–33.0)
MCHC: 33.5 g/dL (ref 31.5–35.7)
MCV: 94 fL (ref 79–97)
Platelets: 235 10*3/uL (ref 150–450)
RBC: 5.22 x10E6/uL (ref 3.77–5.28)
RDW: 13.5 % (ref 11.7–15.4)
WBC: 8.4 10*3/uL (ref 3.4–10.8)

## 2024-01-21 LAB — HEPATIC FUNCTION PANEL
ALT: 16 IU/L (ref 0–32)
AST: 20 IU/L (ref 0–40)
Albumin: 4.5 g/dL (ref 3.9–4.9)
Alkaline Phosphatase: 74 IU/L (ref 44–121)
Bilirubin Total: 0.5 mg/dL (ref 0.0–1.2)
Bilirubin, Direct: 0.16 mg/dL (ref 0.00–0.40)
Total Protein: 7.5 g/dL (ref 6.0–8.5)

## 2024-01-21 LAB — HEMOGLOBIN A1C
Est. average glucose Bld gHb Est-mCnc: 123 mg/dL
Hgb A1c MFr Bld: 5.9 % — ABNORMAL HIGH (ref 4.8–5.6)

## 2024-01-21 LAB — MICROALBUMIN / CREATININE URINE RATIO
Creatinine, Urine: 52.7 mg/dL
Microalb/Creat Ratio: 59 mg/g{creat} — ABNORMAL HIGH (ref 0–29)
Microalbumin, Urine: 31 ug/mL

## 2024-01-21 LAB — BASIC METABOLIC PANEL WITH GFR
BUN/Creatinine Ratio: 12 (ref 12–28)
BUN: 15 mg/dL (ref 8–27)
CO2: 25 mmol/L (ref 20–29)
Calcium: 10.6 mg/dL — ABNORMAL HIGH (ref 8.7–10.3)
Chloride: 105 mmol/L (ref 96–106)
Creatinine, Ser: 1.24 mg/dL — ABNORMAL HIGH (ref 0.57–1.00)
Glucose: 103 mg/dL — ABNORMAL HIGH (ref 70–99)
Potassium: 3.8 mmol/L (ref 3.5–5.2)
Sodium: 146 mmol/L — ABNORMAL HIGH (ref 134–144)
eGFR: 47 mL/min/{1.73_m2} — ABNORMAL LOW (ref >59)

## 2024-01-21 LAB — CORTISOL: Cortisol: 10.8 ug/dL (ref 6.2–19.4)

## 2024-01-21 LAB — TSH+FREE T4
Free T4: 1.24 ng/dL (ref 0.82–1.77)
TSH: 2.14 u[IU]/mL (ref 0.450–4.500)

## 2024-01-22 ENCOUNTER — Encounter: Payer: Self-pay | Admitting: Family Medicine

## 2024-01-23 ENCOUNTER — Encounter: Payer: Self-pay | Admitting: Family Medicine

## 2024-01-23 ENCOUNTER — Ambulatory Visit (INDEPENDENT_AMBULATORY_CARE_PROVIDER_SITE_OTHER): Payer: Medicare HMO | Admitting: Family Medicine

## 2024-01-23 VITALS — BP 124/78 | HR 92 | Temp 98.1°F | Ht 60.0 in | Wt 150.8 lb

## 2024-01-23 DIAGNOSIS — E039 Hypothyroidism, unspecified: Secondary | ICD-10-CM

## 2024-01-23 DIAGNOSIS — Z87440 Personal history of urinary (tract) infections: Secondary | ICD-10-CM

## 2024-01-23 DIAGNOSIS — I10 Essential (primary) hypertension: Secondary | ICD-10-CM | POA: Diagnosis not present

## 2024-01-23 DIAGNOSIS — N289 Disorder of kidney and ureter, unspecified: Secondary | ICD-10-CM

## 2024-01-23 LAB — POCT URINALYSIS DIP (CLINITEK)
Bilirubin, UA: NEGATIVE
Blood, UA: NEGATIVE
Glucose, UA: NEGATIVE mg/dL
Ketones, POC UA: NEGATIVE mg/dL
Leukocytes, UA: NEGATIVE
Nitrite, UA: NEGATIVE
POC PROTEIN,UA: NEGATIVE
Spec Grav, UA: 1.01 (ref 1.010–1.025)
Urobilinogen, UA: 0.2 U/dL
pH, UA: 7 (ref 5.0–8.0)

## 2024-01-23 MED ORDER — AMLODIPINE BESYLATE 2.5 MG PO TABS: 2.5000 mg | ORAL_TABLET | Freq: Every day | ORAL | 2 refills | Status: DC

## 2024-01-23 MED ORDER — REPATHA 140 MG/ML ~~LOC~~ SOSY: PREFILLED_SYRINGE | SUBCUTANEOUS | 0 refills | Status: DC

## 2024-01-23 MED ORDER — ALBUTEROL SULFATE HFA 108 (90 BASE) MCG/ACT IN AERS: 2.0000 | INHALATION_SPRAY | Freq: Four times a day (QID) | RESPIRATORY_TRACT | 2 refills | Status: DC | PRN

## 2024-01-23 MED ORDER — HYDROCORTISONE 10 MG PO TABS: ORAL_TABLET | ORAL | 1 refills | Status: AC

## 2024-01-23 MED ORDER — LEVOTHYROXINE SODIUM 88 MCG PO TABS: ORAL_TABLET | ORAL | 2 refills | Status: DC

## 2024-01-23 NOTE — Progress Notes (Signed)
 Subjective:    Patient ID: Stacy Jensen, female    DOB: 1956-03-28, 68 y.o.   MRN: [id number]  HPI 5 month f/u  Hypothyroidism  Adrenal Insufficiency  Hypertension  Prediabetes  Mild Chronic Kidney Disease  Hyperlipidemia  Discussed the use of AI scribe software for clinical note transcription with the patient, who gave verbal consent to proceed.  History of Present Illness   Stacy Jensen "Jodette" is a 68 year old female with recurrent urinary tract infections who presents with urinary frequency and discomfort.  She experiences urinary frequency and discomfort, which she attributes to recurrent urinary tract infections (UTIs). She had a UTI over the weekend and another in February. Symptoms include stinging and burning during urination. Her husband noticed changes in her behavior, prompting her to seek care. She visited urgent care and was prescribed a seven-day course of antibiotics.  She has a history of five consecutive UTIs around this time last year. She maintains regular fluid intake and urinates frequently, often using pads due to leakage. She experiences pain across her lower abdomen during urinary issues, which she associates with UTIs. During her last UTI, she noted hematuria, which she believes was not vaginal in origin.  Her creatinine levels have been elevated, with a recent increase from 1.12 to 1.24 following her recent UTI. Her calcium  levels were slightly elevated, and she is scheduled for repeat blood work in a few weeks. She is on Repatha  for cholesterol management, which has improved her LDL levels from 205 to 131. Her hemoglobin is slightly elevated at 16.4, and her A1c has improved from 6.1 to 5.9, indicating better glucose control.  She is retired and spends time with her grandchildren. Her husband is also retired and works part-time.      Patient is on Repatha  Did not tolerate statins Patient does take blood pressure medicine amlodipine  on a regular  basis Also takes her thyroid  regular basis Does not tolerate statins due to muscle pains          Review of Systems     Objective:   Physical Exam General-in no acute distress Eyes-no discharge Lungs-respiratory rate normal, CTA CV-no murmurs,RRR Extremities skin warm dry no edema Neuro grossly normal Behavior normal, alert        Assessment & Plan:   Assessment and Plan    Urinary Tract Infection (UTI) Recurrent UTIs with five episodes last year. Discussed risks of long-term antibiotic use, including antibiotic resistance. Upcoming urology appointment and sonogram scheduled. - Perform urine culture after completion of antibiotics and several days off treatment. - Share urine culture results with urologist, Dr. Derrick Fling. - Evaluate bladder emptying with urologist.  Chronic Kidney Disease (CKD) Creatinine increased from 1.12 to 1.24, indicating worsening kidney function, possibly related to recent UTI. Calcium  levels slightly elevated. Discussed potential need for nephrology consultation if decline continues. - Repeat blood work in 3-4 weeks, including creatinine and calcium  levels. - Perform parathyroid hormone and ionized calcium  test.  Hypertension Blood pressure readings improved, with recent measurements at 120/78 mmHg. Experiences higher readings in clinical settings, likely due to situational factors.  Hyperlipidemia LDL cholesterol elevated at 131 mg/dL. Statins not well tolerated due to myalgia. Currently on Repatha , which improved LDL from 205 mg/dL two years ago. Ezetimibe  discussed as an additional option to further lower cholesterol. - Continue Repatha . - Consider adding ezetimibe  to lipid-lowering regimen.  Prediabetes A1c improved from 6.1% to 5.9%, indicating better glycemic control.     Results for orders placed  or performed in visit on 01/23/24  POCT URINALYSIS DIP (CLINITEK)   Collection Time: 01/23/24 11:56 AM  Result Value Ref Range    Color, UA yellow yellow   Clarity, UA clear clear   Glucose, UA negative negative mg/dL   Bilirubin, UA negative negative   Ketones, POC UA negative negative mg/dL   Spec Grav, UA 0.981 1.914 - 1.025   Blood, UA negative negative   pH, UA 7.0 5.0 - 8.0   POC PROTEIN,UA negative negative, trace   Urobilinogen, UA 0.2 0.2 or 1.0 E.U./dL   Nitrite, UA Negative Negative   Leukocytes, UA Negative Negative   Labs were reviewed in detail 1. Hypothyroidism, unspecified type Continue medication. - levothyroxine  (SYNTHROID ) 88 MCG tablet; TAKE 1 TABLET BY MOUTH ONCE DAILY 5  DAYS  PER  WEEK  Dispense: 100 tablet; Refill: 2 - Parathyroid hormone, intact (no Ca)  2. Essential hypertension, benign (Primary) Blood pressure decent control continue current measures check lab before next follow-up visit - Basic metabolic panel with GFR  3. Renal insufficiency-I am concerned about this if her creatinine is going up referral to nephrology Check lab before next follow-up visit - Parathyroid hormone, intact (no Ca) - Calcium , ionized  4. Serum calcium  elevated Check calcium  and parathyroid hormone before next follow-up visit - Parathyroid hormone, intact (no Ca) - Calcium , ionized  5. History of UTI Will be seeing urology in the near future check a culture and UA before that visit - POCT URINALYSIS DIP (CLINITEK) - Urine Culture - Urinalysis, Routine w reflex microscopic  Consider ARB if creatinine is going up more or staying persistently elevated

## 2024-02-04 ENCOUNTER — Ambulatory Visit (HOSPITAL_COMMUNITY)
Admission: RE | Admit: 2024-02-04 | Discharge: 2024-02-04 | Disposition: A | Discharge status: Home / Self Care | Source: Ambulatory Visit | Attending: Urology | Admitting: Urology

## 2024-02-04 DIAGNOSIS — Z87440 Personal history of urinary (tract) infections: Secondary | ICD-10-CM | POA: Diagnosis not present

## 2024-02-04 DIAGNOSIS — N281 Cyst of kidney, acquired: Secondary | ICD-10-CM | POA: Insufficient documentation

## 2024-02-04 DIAGNOSIS — E039 Hypothyroidism, unspecified: Secondary | ICD-10-CM | POA: Diagnosis not present

## 2024-02-04 DIAGNOSIS — N289 Disorder of kidney and ureter, unspecified: Secondary | ICD-10-CM | POA: Diagnosis not present

## 2024-02-04 DIAGNOSIS — N200 Calculus of kidney: Secondary | ICD-10-CM | POA: Diagnosis not present

## 2024-02-04 DIAGNOSIS — I10 Essential (primary) hypertension: Secondary | ICD-10-CM | POA: Diagnosis not present

## 2024-02-05 ENCOUNTER — Ambulatory Visit: Payer: Self-pay

## 2024-02-05 LAB — URINALYSIS, ROUTINE W REFLEX MICROSCOPIC
Bilirubin, UA: NEGATIVE
Glucose, UA: NEGATIVE
Ketones, UA: NEGATIVE
Nitrite, UA: NEGATIVE
Protein,UA: NEGATIVE
RBC, UA: NEGATIVE
Specific Gravity, UA: 1.007 (ref 1.005–1.030)
Urobilinogen, Ur: 0.2 mg/dL (ref 0.2–1.0)
pH, UA: 7.5 (ref 5.0–7.5)

## 2024-02-05 LAB — BASIC METABOLIC PANEL WITH GFR
BUN/Creatinine Ratio: 9 — ABNORMAL LOW (ref 12–28)
BUN: 11 mg/dL (ref 8–27)
CO2: 25 mmol/L (ref 20–29)
Calcium: 9.9 mg/dL (ref 8.7–10.3)
Chloride: 101 mmol/L (ref 96–106)
Creatinine, Ser: 1.16 mg/dL — ABNORMAL HIGH (ref 0.57–1.00)
Glucose: 93 mg/dL (ref 70–99)
Potassium: 4.1 mmol/L (ref 3.5–5.2)
Sodium: 143 mmol/L (ref 134–144)
eGFR: 51 mL/min/{1.73_m2} — ABNORMAL LOW (ref >59)

## 2024-02-05 LAB — MICROSCOPIC EXAMINATION
Casts: NONE SEEN /LPF
Epithelial Cells (non renal): NONE SEEN /HPF (ref 0–10)
WBC, UA: >30 /HPF — AB (ref 0–5)

## 2024-02-05 LAB — PARATHYROID HORMONE, INTACT (NO CA): PTH: 24 pg/mL (ref 15–65)

## 2024-02-05 LAB — CALCIUM, IONIZED: Calcium, Ion: 5.2 mg/dL (ref 4.5–5.6)

## 2024-02-06 LAB — URINE CULTURE

## 2024-02-10 ENCOUNTER — Ambulatory Visit: Payer: Medicare HMO | Admitting: Urology

## 2024-02-10 ENCOUNTER — Ambulatory Visit: Payer: Self-pay | Admitting: Family Medicine

## 2024-02-10 ENCOUNTER — Encounter: Payer: Self-pay | Admitting: Urology

## 2024-02-10 VITALS — BP 172/94 | HR 90

## 2024-02-10 DIAGNOSIS — R8271 Bacteriuria: Secondary | ICD-10-CM | POA: Diagnosis not present

## 2024-02-10 DIAGNOSIS — R399 Unspecified symptoms and signs involving the genitourinary system: Secondary | ICD-10-CM | POA: Diagnosis not present

## 2024-02-10 DIAGNOSIS — R3129 Other microscopic hematuria: Secondary | ICD-10-CM | POA: Diagnosis not present

## 2024-02-10 DIAGNOSIS — N281 Cyst of kidney, acquired: Secondary | ICD-10-CM

## 2024-02-10 LAB — URINALYSIS, ROUTINE W REFLEX MICROSCOPIC
Bilirubin, UA: NEGATIVE
Glucose, UA: NEGATIVE
Ketones, UA: NEGATIVE
Nitrite, UA: NEGATIVE
Specific Gravity, UA: 1.01 (ref 1.005–1.030)
Urobilinogen, Ur: 0.2 mg/dL (ref 0.2–1.0)
pH, UA: 7 (ref 5.0–7.5)

## 2024-02-10 LAB — MICROSCOPIC EXAMINATION: WBC, UA: >30 /HPF — AB (ref 0–5)

## 2024-02-10 NOTE — Progress Notes (Unsigned)
 02/10/2024 9:25 AM   Stacy Jensen 09-09-1956 [id number]  Referring provider: Bennet Brasil, MD [address] [place],  [place] [address]  No chief complaint on file.   HPI:  F/u -    1) frequency and urgency - also incontinence when she stands or gets home with sudden urgency x many years. Wears a pad. Worse after pituitary surgery in 2009, but no other NG risk. She tried oxybutynin  but had bothersome dry mouth. She tried Myrbetriq  also which didn't help and expensive. No GU surgery or pelvic surgery. No constipation. She takes probiotics.   2) UTI - She gets symptoms such as urine odor. She drinks a lot of fountain coke and pepsi - two 44 oz per day. Urine cx grew e coli, strep and e coli. Abx help with urine but sometimes she has no dysuria.    She worked with [employer] at [facility] with PT. Drinking less caffeine/soda. PVR was 14 ml. Urgency improved. Her renal US  images reviewed and benign from Feb 2023.    3) renal cyst, chronic kidney disease-her creatinine was 1.44 with a GFR of 40-47 January 2023.  She does have a history of type 2 diabetes weeks. Feb 2023 renal US  benign - 1.9 cm right renal cyst.  Renal US  May 2024 - two right renal cyst. Poss 7 mm right stone. May 2024 KUB - no renal stones. She had a large gall stone.    Today, seen for the above. May 2025 Renal US  - 1. No acute abnormality identified. Cysts - largest 17 mm right. 2. 8.1 mm nonobstructing stone noted in the right kidney. She used e cream but didn't refill. She takes d-mannose. She asked about abx prophylaxis. With a recent UTI she had pain in the bilateral groin.   She had an e coli and aerococcus + urine cx. UA with many bacteria and 3-10 rbc.    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    PMH: Past Medical History:  Diagnosis Date   Anemia    Arthritis    Cushing's syndrome (HCC)    GERD (gastroesophageal reflux disease) 04/11/2020   Hypertension     Hypertriglyceridemia    Hypopituitarism (HCC) 09/29/2021   Had cushings syndrome with pituitary growth, then had pituitary excision    Osteoporosis    Pituitary Cushing syndrome (HCC)    tumor removed   Sleep apnea 2007    Surgical History: Past Surgical History:  Procedure Laterality Date   COLONOSCOPY WITH PROPOFOL  N/A 01/18/2022   Procedure: COLONOSCOPY WITH PROPOFOL ;  Surgeon: Ruby Corporal, MD;  [facility];  Service: Endoscopy;  Laterality: N/A;  820   PITUITARY EXCISION     POLYPECTOMY  01/18/2022   Procedure: POLYPECTOMY;  Surgeon: Ruby Corporal, MD;  [facility];  Service: Endoscopy;;   TUBAL LIGATION      Home Medications:  Allergies as of 02/10/2024       Reactions   Codeine Nausea And Vomiting   Statins Other (See Comments)   Body aches        Medication List        Accurate as of Feb 10, 2024  9:25 AM. If you have any questions, ask your nurse or doctor.          STOP taking these medications    albuterol  108 (90 Base) MCG/ACT inhaler Commonly known as: VENTOLIN  HFA  cetirizine 10 MG tablet Commonly known as: ZYRTEC       TAKE these medications    acetaminophen 650 MG CR tablet Commonly known as: TYLENOL Take 650 mg by mouth every 8 (eight) hours as needed for pain.   amLODipine  2.5 MG tablet Commonly known as: NORVASC  Take 1 tablet (2.5 mg total) by mouth daily.   APPLE CIDER VINEGAR PO Take 30 mLs by mouth in the morning and at bedtime.   B-complex with vitamin C tablet Take 1 tablet by mouth every evening.   CITRACAL +D3 PO Take 1 tablet by mouth See admin instructions. Take 1 tablet by mouth twice daily on Sundays & Wednesdays. Take 1 tablet by mouth once daily on Mondays, Tuesdays, Thursdays, Fridays & Saturdays.   Coenzyme Q10 200 MG capsule Take 200 mg by mouth in the morning.   Colon Cleanse Caps Take 1 capsule by mouth every evening.   estradiol  0.1 MG/GM vaginal cream Commonly known as:  ESTRACE  Discard plastic applicator. Insert a blueberry size amount (approximately 1 gram) of cream on fingertip inside vagina at bedtime every night for 1 week then 2 nights per week for long term use.   famotidine  20 MG tablet Commonly known as: PEPCID  Take 20 mg by mouth in the morning.   Fish Oil Triple Strength 1400 MG Caps Take 1,400 mg by mouth in the morning.   Goodys Extra Strength 500-325-65 MG Pack Generic drug: Aspirin-Acetaminophen-Caffeine Take 1 packet by mouth daily as needed (pain.).   hydrocortisone  10 MG tablet Commonly known as: CORTEF  One qam and 1/2 in the evening   levothyroxine  88 MCG tablet Commonly known as: SYNTHROID  TAKE 1 TABLET BY MOUTH ONCE DAILY 5  DAYS  PER  WEEK   Magnesium 200 MG Tabs Take 200 mg by mouth at bedtime.   multivitamin with minerals tablet Take 1 tablet by mouth in the morning. Centrum Silver for Women 50+   Repatha  140 MG/ML Sosy Generic drug: Evolocumab  INJECT 1 ML SUBCUTANEOUSLY  EVERY TWO WEEKS   UNABLE TO FIND Med Name: total beets b/p support   VITAMIN B-12 PO Take 1 tablet by mouth in the morning.        Allergies:  Allergies  Allergen Reactions   Codeine Nausea And Vomiting   Statins Other (See Comments)    Body aches    Family History: Family History  Problem Relation Age of Onset   Hypertension Mother    Hypertension Father    Heart attack Father    Colon cancer Neg Hx     Social History:  reports that she has never smoked. She has never used smokeless tobacco. She reports that she does not drink alcohol and does not use drugs.   Physical Exam: BP (!) 172/94   Pulse 90   Constitutional:  Alert and oriented, No acute distress. HEENT: [place] AT, moist mucus membranes.  Trachea midline, no masses. Cardiovascular: No clubbing, cyanosis, or edema. Respiratory: Normal respiratory effort, no increased work of breathing. GI: Abdomen is soft, nontender, nondistended, no abdominal masses GU: No CVA  tenderness Lymph: No cervical or inguinal lymphadenopathy. Skin: No rashes, bruises or suspicious lesions. Neurologic: Grossly intact, no focal deficits, moving all 4 extremities. Psychiatric: Normal mood and affect.  Laboratory Data: Lab Results  Component Value Date   WBC 8.4 01/20/2024   HGB 16.4 (H) 01/20/2024   HCT 48.9 (H) 01/20/2024   MCV 94 01/20/2024   PLT 235 01/20/2024    Lab Results  Component  Value Date   CREATININE 1.16 (H) 02/04/2024    No results found for: "PSA"  No results found for: "TESTOSTERONE"  Lab Results  Component Value Date   HGBA1C 5.9 (H) 01/20/2024    Urinalysis    Component Value Date/Time   COLORURINE YELLOW 02/26/2008 1429   APPEARANCEUR Cloudy (A) 02/04/2024 1200   LABSPEC 1.015 02/26/2008 1640   PHURINE 5.5 02/26/2008 1640   GLUCOSEU Negative 02/04/2024 1200   HGBUR NEGATIVE 02/26/2008 1640   BILIRUBINUR Negative 02/04/2024 1200   KETONESUR negative 01/23/2024 1156   KETONESUR NEGATIVE 02/26/2008 1640   PROTEINUR Negative 02/04/2024 1200   PROTEINUR NEGATIVE 02/26/2008 1640   UROBILINOGEN 0.2 01/23/2024 1156   UROBILINOGEN 0.2 02/26/2008 1640   NITRITE Negative 02/04/2024 1200   NITRITE NEGATIVE 02/26/2008 1640   LEUKOCYTESUR 3+ (A) 02/04/2024 1200    Lab Results  Component Value Date   LABMICR See below: 02/04/2024   WBCUA >30 (A) 02/04/2024   LABEPIT None seen 02/04/2024   MUCUS Present 02/28/2022   BACTERIA Few 02/04/2024    Pertinent Imaging: *** Results for orders placed during the hospital encounter of 02/12/23  DG Abd 1 View  Narrative CLINICAL DATA:  Kidney stone  EXAM: ABDOMEN - 1 VIEW  COMPARISON:  None Available.  FINDINGS: The bowel gas pattern is normal. Right upper quadrant calcifications noted that are likely gallstones.  IMPRESSION: Probable gallstones.  Unremarkable bowel gas pattern.   Electronically Signed By: Sydell Eva M.D. On: 02/16/2023 18:38  No results found for this  or any previous visit.  No results found for this or any previous visit.  No results found for this or any previous visit.  Results for orders placed during the hospital encounter of 02/04/24  US  RENAL  Narrative CLINICAL DATA:  Follow-up kidney cyst.  EXAM: RENAL / URINARY TRACT ULTRASOUND COMPLETE  COMPARISON:  Feb 04, 2023  FINDINGS: Right Kidney:  Renal measurements: 9 x 3.7 x 4.6 cm = volume: 80.4 mL. 8.1 mm nonobstructing stone noted. Echogenicity within normal limits. No hydronephrosis visualized. Kidney cysts are noted, largest measures 1.4 x 1.5 x 1.7 cm. No follow-up is recommended.  Left Kidney:  Renal measurements: 10.2 x 5.3 x 5 cm = volume: 140.6 mL. Echogenicity within normal limits. No mass or hydronephrosis visualized.  Bladder:  Appears normal for degree of bladder distention.  Other:  None.  IMPRESSION: 1. No acute abnormality identified. 2. 8.1 mm nonobstructing stone noted in the right kidney.   Electronically Signed By: Anna Barnes M.D. On: 02/04/2024 13:51  No results found for this or any previous visit.  No results found for this or any previous visit.  No results found for this or any previous visit.   Assessment & Plan:    1. UTI symptoms (Primary) Again discussed new [facility] guidelines on GSM. She will try and do the TVE.   - Urinalysis, Routine w reflex microscopic  2. MH - eval the GU tract with CT and cystoscopy/   No follow-ups on file.  Christina Coyer, MD  [facility]  [address] [place], [place] [address] [phone]

## 2024-02-27 ENCOUNTER — Other Ambulatory Visit: Payer: Self-pay | Admitting: Family Medicine

## 2024-03-19 DIAGNOSIS — I10 Essential (primary) hypertension: Secondary | ICD-10-CM | POA: Diagnosis not present

## 2024-03-19 DIAGNOSIS — N390 Urinary tract infection, site not specified: Secondary | ICD-10-CM | POA: Diagnosis not present

## 2024-03-19 DIAGNOSIS — E663 Overweight: Secondary | ICD-10-CM | POA: Diagnosis not present

## 2024-03-19 DIAGNOSIS — Z6829 Body mass index (BMI) 29.0-29.9, adult: Secondary | ICD-10-CM | POA: Diagnosis not present

## 2024-03-23 ENCOUNTER — Ambulatory Visit (HOSPITAL_COMMUNITY)
Admission: RE | Admit: 2024-03-23 | Discharge: 2024-03-23 | Disposition: A | Discharge status: Home / Self Care | Source: Ambulatory Visit | Attending: Urology | Admitting: Urology

## 2024-03-23 DIAGNOSIS — K4090 Unilateral inguinal hernia, without obstruction or gangrene, not specified as recurrent: Secondary | ICD-10-CM | POA: Diagnosis not present

## 2024-03-23 DIAGNOSIS — N281 Cyst of kidney, acquired: Secondary | ICD-10-CM | POA: Insufficient documentation

## 2024-03-23 DIAGNOSIS — R3129 Other microscopic hematuria: Secondary | ICD-10-CM | POA: Insufficient documentation

## 2024-03-23 DIAGNOSIS — K5730 Diverticulosis of large intestine without perforation or abscess without bleeding: Secondary | ICD-10-CM | POA: Diagnosis not present

## 2024-03-23 LAB — POCT I-STAT CREATININE: Creatinine, Ser: 1.4 mg/dL — ABNORMAL HIGH (ref 0.44–1.00)

## 2024-03-23 MED ORDER — IOHEXOL 300 MG/ML  SOLN
125.0000 mL | Freq: Once | INTRAMUSCULAR | Status: AC | PRN
  Administered 2024-03-23: 100 mL via INTRAVENOUS

## 2024-03-24 ENCOUNTER — Ambulatory Visit: Payer: Self-pay

## 2024-03-30 ENCOUNTER — Ambulatory Visit: Admitting: Urology

## 2024-03-30 VITALS — BP 166/75 | HR 101

## 2024-03-30 DIAGNOSIS — R350 Frequency of micturition: Secondary | ICD-10-CM

## 2024-03-30 DIAGNOSIS — N952 Postmenopausal atrophic vaginitis: Secondary | ICD-10-CM | POA: Diagnosis not present

## 2024-03-30 LAB — URINALYSIS, ROUTINE W REFLEX MICROSCOPIC
Bilirubin, UA: NEGATIVE
Glucose, UA: NEGATIVE
Ketones, UA: NEGATIVE
Nitrite, UA: NEGATIVE
Protein,UA: NEGATIVE
RBC, UA: NEGATIVE
Specific Gravity, UA: 1.015 (ref 1.005–1.030)
Urobilinogen, Ur: 0.2 mg/dL (ref 0.2–1.0)
pH, UA: 7 (ref 5.0–7.5)

## 2024-03-30 LAB — MICROSCOPIC EXAMINATION: Bacteria, UA: NONE SEEN

## 2024-03-30 MED ORDER — MIRABEGRON ER 25 MG PO TB24: 25.0000 mg | ORAL_TABLET | Freq: Every day | ORAL | 11 refills | Status: AC

## 2024-03-30 MED ORDER — CIPROFLOXACIN HCL 500 MG PO TABS
500.0000 mg | ORAL_TABLET | Freq: Once | ORAL | Status: AC
  Administered 2024-03-30: 500 mg via ORAL

## 2024-03-30 NOTE — Progress Notes (Signed)
   03/30/24  CC: No chief complaint on file.   HPI: F/u -    1) frequency and urgency - also incontinence when she stands or gets home with sudden urgency x many years. Wears a pad. Worse after pituitary surgery in 2009, but no other NG risk. She tried oxybutynin  but had bothersome dry mouth. She tried Myrbetriq  also which didn't help and expensive. No GU surgery or pelvic surgery. No constipation. She takes probiotics.   2) UTI - She gets symptoms such as urine odor. She drinks a lot of fountain coke and pepsi - two 44 oz per day. Urine cx grew e coli, strep and e coli. Abx help with urine but sometimes she has no dysuria. She used e cream but didn't refill. She takes d-mannose. She asked about abx prophylaxis. With a recent UTI she had pain in the bilateral groin. She had an e coli and aerococcus + urine cx. UA with many bacteria and 3-10 rbc.    She worked with [employer] at [facility] with PT. Drinking less caffeine/soda. PVR was 14 ml. Urgency improved. Her renal US  images reviewed and benign from Feb 2023.    3) renal cyst, chronic kidney disease-her creatinine was 1.44 with a GFR of 40-47 January 2023.  She does have a history of type 2 diabetes weeks. Feb 2023 renal US  benign - 1.9 cm right renal cyst.  Renal US  May 2024 - two right renal cyst. Poss 7 mm right stone. May 2024 KUB - no renal stones. She had a large gall stone.    Today, seen for the above. Rx TVE May 2025 after discussing GSM guidelines. Jun 2025 CT benign - 2 simple cysts in the right kidney with largest in the lower pole cortex measuring 1.3 x 1.6 cm. Cysto today - Jul 2025 - benign. No SUI. She didn't start TVE. SHe took abx for urinary frequency (no bladder pain or dysuria). Frequency today and UA clear.    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   Blood pressure (!) 166/75, pulse (!) 101. NED. A&Ox3.   No respiratory distress   Abd soft, NT, ND Normal external genitalia with patent  urethral meatus. No prolapse. Moderate atrophy.   Chaperone: Alisheya - for exam and cystoscopy   Cystoscopy Procedure Note  Patient identification was confirmed, informed consent was obtained, and patient was prepped using Betadine solution.  Lidocaine  jelly was administered per urethral meatus.    Procedure: - Flexible cystoscope introduced, without any difficulty.   - Thorough search of the bladder revealed:    normal urethral meatus    normal urothelium    no stones    no ulcers     no tumors    no urethral polyps    no trabeculation  - Ureteral orifices were normal in position and appearance.  Post-Procedure: - Patient tolerated the procedure well  Assessment/ Plan:  1) urinary frequency - again discussed not likely related to UTI - more OAB. Discussed meds, PTNS botox.   2) AV - again discussed GSM guideline, nature r/b/a to TVE,    No follow-ups on file.  Donnice Brooks, MD

## 2024-04-07 DIAGNOSIS — H2513 Age-related nuclear cataract, bilateral: Secondary | ICD-10-CM | POA: Diagnosis not present

## 2024-04-07 DIAGNOSIS — H43813 Vitreous degeneration, bilateral: Secondary | ICD-10-CM | POA: Diagnosis not present

## 2024-04-07 DIAGNOSIS — H5203 Hypermetropia, bilateral: Secondary | ICD-10-CM | POA: Diagnosis not present

## 2024-04-07 DIAGNOSIS — H52223 Regular astigmatism, bilateral: Secondary | ICD-10-CM | POA: Diagnosis not present

## 2024-04-07 DIAGNOSIS — H524 Presbyopia: Secondary | ICD-10-CM | POA: Diagnosis not present

## 2024-04-07 DIAGNOSIS — H35363 Drusen (degenerative) of macula, bilateral: Secondary | ICD-10-CM | POA: Diagnosis not present

## 2024-04-10 ENCOUNTER — Other Ambulatory Visit: Payer: Self-pay | Admitting: Family Medicine

## 2024-04-10 DIAGNOSIS — E039 Hypothyroidism, unspecified: Secondary | ICD-10-CM

## 2024-04-10 DIAGNOSIS — H524 Presbyopia: Secondary | ICD-10-CM | POA: Diagnosis not present

## 2024-05-27 ENCOUNTER — Ambulatory Visit (INDEPENDENT_AMBULATORY_CARE_PROVIDER_SITE_OTHER)

## 2024-05-27 DIAGNOSIS — Z23 Encounter for immunization: Secondary | ICD-10-CM

## 2024-06-02 DIAGNOSIS — Z1231 Encounter for screening mammogram for malignant neoplasm of breast: Secondary | ICD-10-CM | POA: Diagnosis not present

## 2024-06-02 LAB — HM MAMMOGRAPHY

## 2024-07-13 ENCOUNTER — Ambulatory Visit: Admitting: Urology

## 2024-07-13 VITALS — BP 133/75 | HR 98

## 2024-07-13 DIAGNOSIS — N952 Postmenopausal atrophic vaginitis: Secondary | ICD-10-CM | POA: Diagnosis not present

## 2024-07-13 DIAGNOSIS — R350 Frequency of micturition: Secondary | ICD-10-CM

## 2024-07-13 LAB — URINALYSIS, ROUTINE W REFLEX MICROSCOPIC
Bilirubin, UA: NEGATIVE
Glucose, UA: NEGATIVE
Ketones, UA: NEGATIVE
Leukocytes,UA: NEGATIVE
Nitrite, UA: NEGATIVE
Protein,UA: NEGATIVE
RBC, UA: NEGATIVE
Specific Gravity, UA: 1.01 (ref 1.005–1.030)
Urobilinogen, Ur: 0.2 mg/dL (ref 0.2–1.0)
pH, UA: 7 (ref 5.0–7.5)

## 2024-07-13 NOTE — Progress Notes (Unsigned)
 07/13/2024 11:52 AM   Stacy Jensen 03-23-1956 [id number]  Referring provider: Alphonsa Glendia LABOR, MD [address] [place],  [place] [address]  No chief complaint on file.   HPI:  F/u -    1) frequency and urgency -associated with UUI when she stands.  Or turn key.  Wears a pad. Worse after pituitary surgery in 2009, but no other NG risk. She tried oxybutynin  but had bothersome dry mouth. She tried Myrbetriq  also which didn't help and expensive. No GU surgery or pelvic surgery. No constipation. She takes probiotics. She worked with [employer] at [facility] with PT. Drinking less caffeine/soda. PVR was 14 ml. Urgency improved. Her renal US  images reviewed and benign from Feb 2023. Rx TVE May 2025 after discussing GSM guidelines. Cysto Jul 2025 - benign. No SUI. Discussed TVE again.    2) UTI - symptoms include urine odor. She drinks a lot of fountain coke and pepsi - two 44 oz per day. Urine cx grew e coli, strep and e coli. Abx help with urine but sometimes she has no dysuria. She used e cream but didn't refill. She takes d-mannose. She asked about abx prophylaxis. With a recent UTI she had pain in the bilateral groin. She had an e coli and aerococcus + urine cx. UA with many bacteria and 3-10 rbc.    3) renal cyst, chronic kidney disease-her creatinine was 1.44 with a GFR of 40-47 January 2023.  She does have a history of type 2 diabetes weeks. Feb 2023 renal US  benign - 1.9 cm right renal cyst.  Renal US  May 2024 - two right renal cyst. Poss 7 mm right stone. May 2024 KUB - no renal stones. She had a large gall stone. Jun 2025 CT benign - 2 simple cysts in the right kidney with largest in the lower pole cortex measuring 1.3 x 1.6 cm.  4) MH - on UA.CT and cysto benign Jun and JUL 2025.    Today, seen for the above.  She did not start TVE. Didn't want to. Started Myrbetriq  and LUTS much improved. UUI much better.    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   PMH: Past Medical History:  Diagnosis Date   Anemia    Arthritis    Cushing's syndrome    GERD (gastroesophageal reflux disease) 04/11/2020   Hypertension    Hypertriglyceridemia    Hypopituitarism 09/29/2021   Had cushings syndrome with pituitary growth, then had pituitary excision    Osteoporosis    Pituitary Cushing syndrome (HCC)    tumor removed   Sleep apnea 2007    Surgical History: Past Surgical History:  Procedure Laterality Date   COLONOSCOPY WITH PROPOFOL  N/A 01/18/2022   Procedure: COLONOSCOPY WITH PROPOFOL ;  Surgeon: Golda Claudis PENNER, MD;  [facility];  Service: Endoscopy;  Laterality: N/A;  820   PITUITARY EXCISION     POLYPECTOMY  01/18/2022   Procedure: POLYPECTOMY;  Surgeon: Golda Claudis PENNER, MD;  [facility];  Service: Endoscopy;;   TUBAL LIGATION      Home Medications:  Allergies as of 07/13/2024       Reactions   Codeine Nausea And Vomiting   Statins Other (See Comments)   Body aches        Medication List        Accurate as of July 13, 2024 11:52 AM. If you have any questions, ask your  nurse or doctor.          acetaminophen 650 MG CR tablet Commonly known as: TYLENOL Take 650 mg by mouth every 8 (eight) hours as needed for pain.   amLODipine  2.5 MG tablet Commonly known as: NORVASC  Take 1 tablet (2.5 mg total) by mouth daily.   APPLE CIDER VINEGAR PO Take 30 mLs by mouth in the morning and at bedtime.   B-complex with vitamin C tablet Take 1 tablet by mouth every evening.   CITRACAL +D3 PO Take 1 tablet by mouth See admin instructions. Take 1 tablet by mouth twice daily on Sundays & Wednesdays. Take 1 tablet by mouth once daily on Mondays, Tuesdays, Thursdays, Fridays & Saturdays.   Coenzyme Q10 200 MG capsule Take 200 mg by mouth in the morning.   Colon Cleanse Caps Take 1 capsule by mouth every evening.   estradiol  0.1 MG/GM vaginal cream Commonly known as: ESTRACE  Discard plastic  applicator. Insert a blueberry size amount (approximately 1 gram) of cream on fingertip inside vagina at bedtime every night for 1 week then 2 nights per week for long term use.   famotidine  20 MG tablet Commonly known as: PEPCID  Take 20 mg by mouth in the morning.   Fish Oil Triple Strength 1400 MG Caps Take 1,400 mg by mouth in the morning.   Goodys Extra Strength 500-325-65 MG Pack Generic drug: Aspirin-Acetaminophen-Caffeine Take 1 packet by mouth daily as needed (pain.).   hydrocortisone  10 MG tablet Commonly known as: CORTEF  One qam and 1/2 in the evening   levothyroxine  88 MCG tablet Commonly known as: SYNTHROID  TAKE 1 TABLET BY MOUTH ONCE DAILY 5  DAYS  PER  WEEK   Magnesium 200 MG Tabs Take 200 mg by mouth at bedtime.   mirabegron  ER 25 MG Tb24 tablet Commonly known as: MYRBETRIQ  Take 1 tablet (25 mg total) by mouth daily.   multivitamin with minerals tablet Take 1 tablet by mouth in the morning. Centrum Silver for Women 50+   Repatha  SureClick 140 MG/ML Soaj Generic drug: Evolocumab  INJECT CONTENTS OF 1 ML SUBCUTANEOUSLY  EVERY TWO WEEKS   UNABLE TO FIND Med Name: total beets b/p support   VITAMIN B-12 PO Take 1 tablet by mouth in the morning.        Allergies:  Allergies  Allergen Reactions   Codeine Nausea And Vomiting   Statins Other (See Comments)    Body aches    Family History: Family History  Problem Relation Age of Onset   Hypertension Mother    Hypertension Father    Heart attack Father    Colon cancer Neg Hx     Social History:  reports that she has never smoked. She has never used smokeless tobacco. She reports that she does not drink alcohol and does not use drugs.   Physical Exam: BP 133/75   Pulse 98   Constitutional:  Alert and oriented, No acute distress. HEENT: [place] AT, moist mucus membranes.  Trachea midline, no masses. Cardiovascular: No clubbing, cyanosis, or edema. Respiratory: Normal respiratory effort, no increased  work of breathing. GI: Abdomen is soft, nontender, nondistended, no abdominal masses GU: No CVA tenderness Skin: No rashes, bruises or suspicious lesions. Neurologic: Grossly intact, no focal deficits, moving all 4 extremities. Psychiatric: Normal mood and affect.  Laboratory Data: Lab Results  Component Value Date   WBC 8.4 01/20/2024   HGB 16.4 (H) 01/20/2024   HCT 48.9 (H) 01/20/2024   MCV 94 01/20/2024   PLT  235 01/20/2024    Lab Results  Component Value Date   CREATININE 1.40 (H) 03/23/2024    No results found for: PSA  No results found for: TESTOSTERONE  Lab Results  Component Value Date   HGBA1C 5.9 (H) 01/20/2024    Urinalysis    Component Value Date/Time   COLORURINE YELLOW 02/26/2008 1429   APPEARANCEUR Clear 03/30/2024 1146   LABSPEC 1.015 02/26/2008 1640   PHURINE 5.5 02/26/2008 1640   GLUCOSEU Negative 03/30/2024 1146   HGBUR NEGATIVE 02/26/2008 1640   BILIRUBINUR Negative 03/30/2024 1146   KETONESUR negative 01/23/2024 1156   KETONESUR NEGATIVE 02/26/2008 1640   PROTEINUR Negative 03/30/2024 1146   PROTEINUR NEGATIVE 02/26/2008 1640   UROBILINOGEN 0.2 01/23/2024 1156   UROBILINOGEN 0.2 02/26/2008 1640   NITRITE Negative 03/30/2024 1146   NITRITE NEGATIVE 02/26/2008 1640   LEUKOCYTESUR Trace (A) 03/30/2024 1146    Lab Results  Component Value Date   LABMICR See below: 03/30/2024   WBCUA 0-5 03/30/2024   LABEPIT 0-10 03/30/2024   MUCUS Present 02/28/2022   BACTERIA None seen 03/30/2024    Pertinent Imaging:  Results for orders placed during the hospital encounter of 02/12/23  DG Abd 1 View  Narrative CLINICAL DATA:  Kidney stone  EXAM: ABDOMEN - 1 VIEW  COMPARISON:  None Available.  FINDINGS: The bowel gas pattern is normal. Right upper quadrant calcifications noted that are likely gallstones.  IMPRESSION: Probable gallstones.  Unremarkable bowel gas pattern.   Electronically Signed By: Fonda Field M.D. On:  02/16/2023 18:38  No results found for this or any previous visit.  No results found for this or any previous visit.  No results found for this or any previous visit.  Results for orders placed during the hospital encounter of 02/04/24  US  RENAL  Narrative CLINICAL DATA:  Follow-up kidney cyst.  EXAM: RENAL / URINARY TRACT ULTRASOUND COMPLETE  COMPARISON:  Feb 04, 2023  FINDINGS: Right Kidney:  Renal measurements: 9 x 3.7 x 4.6 cm = volume: 80.4 mL. 8.1 mm nonobstructing stone noted. Echogenicity within normal limits. No hydronephrosis visualized. Kidney cysts are noted, largest measures 1.4 x 1.5 x 1.7 cm. No follow-up is recommended.  Left Kidney:  Renal measurements: 10.2 x 5.3 x 5 cm = volume: 140.6 mL. Echogenicity within normal limits. No mass or hydronephrosis visualized.  Bladder:  Appears normal for degree of bladder distention.  Other:  None.  IMPRESSION: 1. No acute abnormality identified. 2. 8.1 mm nonobstructing stone noted in the right kidney.   Electronically Signed By: Craig Farr M.D. On: 02/04/2024 13:51     Assessment & Plan:    Frequency, AV - she will continue Myrbetriq . F/u in 1 year.   No follow-ups on file.  Stacy Brooks, MD  [facility]  [address] [place], [place] [address] [phone]

## 2024-07-27 ENCOUNTER — Telehealth: Payer: Self-pay

## 2024-07-27 NOTE — Telephone Encounter (Signed)
 Copied from CRM [id number]. Topic: Clinical - Medical Advice >> Jul 23, 2024  3:01 PM Antony RAMAN wrote: Reason for CRM: has to have dental work done but dentist wont do it until pt is cleared from her pcp due to her blood pressure. Advise pt, Cb- [phone]

## 2024-08-12 ENCOUNTER — Telehealth: Payer: Self-pay | Admitting: Pharmacist

## 2024-08-12 NOTE — Telephone Encounter (Signed)
   This patient is appearing on a report for being at risk of failing the adherence measure for CBP (blood pressure)   Repeat BP at PCP office prior to dental procedure was 133/75 Stable and at goal    Mliss Tarry Griffin, PharmD, BCACP, CPP Clinical Pharmacist, [facility]

## 2024-08-13 ENCOUNTER — Encounter: Payer: Self-pay | Admitting: Nurse Practitioner

## 2024-08-13 ENCOUNTER — Ambulatory Visit (INDEPENDENT_AMBULATORY_CARE_PROVIDER_SITE_OTHER): Payer: Self-pay | Admitting: Nurse Practitioner

## 2024-08-13 VITALS — BP 135/85 | HR 105 | Ht 60.0 in | Wt 150.2 lb

## 2024-08-13 DIAGNOSIS — I10 Essential (primary) hypertension: Secondary | ICD-10-CM | POA: Diagnosis not present

## 2024-08-13 DIAGNOSIS — N1831 Chronic kidney disease, stage 3a: Secondary | ICD-10-CM | POA: Diagnosis not present

## 2024-08-13 NOTE — Patient Instructions (Signed)
 Increase Amlodipine  to 5 mg (take 2 of the 2.5 mg tablets) Monitor BP daily and record results.  Bring log to next visit.

## 2024-08-14 ENCOUNTER — Encounter: Payer: Self-pay | Admitting: Nurse Practitioner

## 2024-08-14 NOTE — Progress Notes (Signed)
 Subjective:    Patient ID: Stacy Jensen, female    DOB: 1955-12-14, 68 y.o.   MRN: [id number]  HPI Discussed the use of AI scribe software for clinical note transcription with the patient, who gave verbal consent to proceed.  History of Present Illness Stacy Jensen is a 68 year old female with hypertension who presents for blood pressure management.  Her blood pressure has been elevated for a long time, with particularly high readings over the past three to four months. A recent dental appointment revealed elevated blood pressure, resulting in the postponement of a procedure. She is on a low dose of amlodipine  and monitors her blood pressure at home using wrist and arm cuffs but questions the accuracy of these devices.  She has a history of kidney cysts and frequent urinary tract infections. Currently, she is on Myrbetriq , which has significantly improved her urinary symptoms, reducing frequency and urgency. She has a history of proteinuria, and her estimated glomerular filtration rate has previously been reported in the 40s and 50s.  She has been on Repatha  for cholesterol management for the past three to four months, which she reports has been effective.   She has a history of Addison's disease, which is under control, and mentions a history of Cushing's syndrome.  Socially, she has a large family with 17 grandchildren and 47 great-grandchildren, describing herself as the 'matriarch' of her family, indicating a significant role in family dynamics.  No unusual chest pain, shortness of breath, or leg swelling. She notes some slight swelling due to a crushed bone in her right ankle. No orthopnea. No numbness, weakness, difficulty speaking, swallowing, or significant visual changes, although she occasionally experiences blurry vision, attributed to her contact lenses and glasses. She reports poor sleep quality, typically sleeping three to five hours per night, but notes improvement  since starting Myrbetriq  due to less urinary frequency and incontinence. Followed by urology.   BP readings at home recently: Arm cuff 127/82 HR 102 137/90 HR 104   Review of Systems  Constitutional:  Positive for fatigue.  HENT:  Negative for sore throat and trouble swallowing.   Respiratory:  Negative for cough, chest tightness, shortness of breath and wheezing.   Cardiovascular:  Negative for chest pain and leg swelling.  Neurological:  Negative for syncope, facial asymmetry, speech difficulty, weakness and numbness.   Social History   Tobacco Use   Smoking status: Never   Smokeless tobacco: Never  Vaping Use   Vaping status: Never Used  Substance Use Topics   Alcohol use: Never   Drug use: Never        Objective:   Physical Exam Vitals and nursing note reviewed.  Constitutional:      General: She is not in acute distress. Cardiovascular:     Rate and Rhythm: Normal rate and regular rhythm.     Heart sounds: Murmur heard.     Comments: Gr 2/6 soft systolic murmur heard through precordium loudest at 2nd ICS-RSB.  Carotids: no bruits or thrills. Can faintly hear murmur on the left side.  Pulmonary:     Effort: Pulmonary effort is normal.     Breath sounds: Normal breath sounds.  Musculoskeletal:     Right lower leg: Edema present.     Left lower leg: No edema.     Comments: Minimal RLE. Wearing an ankle support with trace edema noted.   Neurological:     Mental Status: She is alert and oriented to person,  place, and time.  Psychiatric:        Mood and Affect: Mood normal.        Behavior: Behavior normal.        Thought Content: Thought content normal.        Judgment: Judgment normal.    Today's Vitals   08/13/24 1457  BP: 135/85  Pulse: (!) 105  SpO2: 96%  Weight: 150 lb 4 oz (68.2 kg)  Height: 5' (1.524 m)   Body mass index is 29.34 kg/m.  Comparing her arm BP monitor with manual check by nurse: Machine 168/132 Manual 130/100        Assessment & Plan:  1. Essential hypertension, benign (Primary) Hypertension poorly controlled with low-dose amlodipine . CKD stage 3 with eGFR <50 and proteinuria. Cardiovascular risk due to uncontrolled hypertension. - Increased amlodipine  to 5 mg daily. - Checked home BP monitor accuracy. Given a Rx for new BP machine with arm cuff. - Monitor BP for 2-3 weeks and report. - Consider increasing amlodipine  to 10 mg if BP uncontrolled. - Recheck kidney function tests. - Discussed lifestyle modifications: stress management, salt intake, sleep hygiene. Non-compliance to CPAP therapy. Discussed impact on BP and health.  - Basic metabolic panel with GFR - Aldosterone + renin activity w/ ratio  2. CKD stage 3a, GFR 45-59 ml/min (HCC) Because of her history of Cushing's will check aldosterone and renin. - Basic metabolic panel with GFR - Aldosterone + renin activity w/ ratio   Return in about 1 month (around 09/12/2024).

## 2024-08-20 LAB — BASIC METABOLIC PANEL WITH GFR
BUN/Creatinine Ratio: 10 — ABNORMAL LOW (ref 12–28)
BUN: 13 mg/dL (ref 8–27)
CO2: 24 mmol/L (ref 20–29)
Calcium: 11 mg/dL — ABNORMAL HIGH (ref 8.7–10.3)
Chloride: 103 mmol/L (ref 96–106)
Creatinine, Ser: 1.24 mg/dL — ABNORMAL HIGH (ref 0.57–1.00)
Glucose: 87 mg/dL (ref 70–99)
Potassium: 3.8 mmol/L (ref 3.5–5.2)
Sodium: 142 mmol/L (ref 134–144)
eGFR: 47 mL/min/1.73 — ABNORMAL LOW (ref >59)

## 2024-08-20 LAB — ALDOSTERONE + RENIN ACTIVITY W/ RATIO
Aldos/Renin Ratio: 3.8 (ref 0.0–30.0)
Aldosterone: 8.4 ng/dL (ref 0.0–30.0)
Renin Activity, Plasma: 2.228 ng/mL/h (ref 0.167–5.380)

## 2024-08-23 ENCOUNTER — Ambulatory Visit: Payer: Self-pay | Admitting: Nurse Practitioner

## 2024-09-10 ENCOUNTER — Ambulatory Visit (INDEPENDENT_AMBULATORY_CARE_PROVIDER_SITE_OTHER): Admitting: Nurse Practitioner

## 2024-09-10 VITALS — BP 132/82 | Ht 60.0 in | Wt 150.4 lb

## 2024-09-10 DIAGNOSIS — I10 Essential (primary) hypertension: Secondary | ICD-10-CM | POA: Diagnosis not present

## 2024-09-10 DIAGNOSIS — M8580 Other specified disorders of bone density and structure, unspecified site: Secondary | ICD-10-CM | POA: Diagnosis not present

## 2024-09-10 DIAGNOSIS — Z13820 Encounter for screening for osteoporosis: Secondary | ICD-10-CM | POA: Diagnosis not present

## 2024-09-10 MED ORDER — AMLODIPINE BESYLATE 10 MG PO TABS: 10.0000 mg | ORAL_TABLET | Freq: Every day | ORAL | 1 refills | Status: AC

## 2024-09-10 NOTE — Patient Instructions (Addendum)
 Please call [facility]/Bone density scheduling [phone]  Increase Amlodipine  to 10 mg once daily

## 2024-09-12 ENCOUNTER — Encounter: Payer: Self-pay | Admitting: Nurse Practitioner

## 2024-09-12 NOTE — Progress Notes (Deleted)
 Stacy Jensen

## 2024-09-12 NOTE — Progress Notes (Signed)
" ° °  Subjective:    Patient ID: Stacy Jensen, female    DOB: 1956/06/01, 68 y.o.   MRN: [id number]  HPI [facility] note could not be processed per their support team. Presents for follow up of HTN. See previous note.  BP running at goal at times with spikes according to her home record. No issues with hypotension such as dizziness or presyncope. No change in edema. Has chronic mild edema in right ankle due to orthopedic issue. Is due for Bone Density study February 2026. History of osteopenia. On chronic oral steroids for adrenal insufficiency related to history of Cushing's and treatment.   Discussed the use of AI scribe software for clinical note transcription with the patient, who gave verbal consent to proceed.   Review of Systems  Constitutional:  Positive for fatigue.  Respiratory:  Negative for cough, chest tightness, shortness of breath and wheezing.   Cardiovascular:  Negative for chest pain and leg swelling.  Neurological:  Negative for syncope and light-headedness.       Objective:   Physical Exam Vitals and nursing note reviewed.  Constitutional:      General: She is not in acute distress. Cardiovascular:     Rate and Rhythm: Normal rate and regular rhythm.  Pulmonary:     Effort: Pulmonary effort is normal.     Breath sounds: Normal breath sounds.  Musculoskeletal:     Right lower leg: No edema.     Left lower leg: No edema.  Neurological:     Mental Status: She is alert and oriented to person, place, and time.  Psychiatric:        Mood and Affect: Mood normal.        Behavior: Behavior normal.        Thought Content: Thought content normal.    Today's Vitals   09/10/24 1448  BP: 132/82  Weight: 150 lb 6.4 oz (68.2 kg)  Height: 5' (1.524 m)   Body mass index is 29.37 kg/m.        Assessment & Plan:  1. Essential hypertension, benign (Primary) Blood pressure variable with spikes. Currently on Amlodipine  5 mg daily. Consider increasing to 10 mg for better  control. Target 130/80 without hypotension. - Increase Amlodipine  to 10 mg daily. - Monitor blood pressure regularly and identify triggers. - Educated on hydration and salt intake.  2. Osteopenia, unspecified location Previous scan showed osteopenia. Discussed Cushing's impact on bone health. - Repeat bone density scan after February 7th. - Evaluate for treatment with Prolia if necessary.  - DG Bone Density  3. Screening for osteoporosis  - DG Bone Density  Return in about 3 months (around 12/09/2024). Call back sooner if any problems.     "

## 2024-09-15 ENCOUNTER — Encounter: Payer: Self-pay | Admitting: Nurse Practitioner

## 2024-10-04 ENCOUNTER — Other Ambulatory Visit: Payer: Self-pay | Admitting: Family Medicine

## 2024-11-02 ENCOUNTER — Other Ambulatory Visit (HOSPITAL_COMMUNITY)

## 2024-12-08 ENCOUNTER — Ambulatory Visit: Admitting: Nurse Practitioner

## 2024-12-18 ENCOUNTER — Ambulatory Visit

## 2025-07-12 ENCOUNTER — Ambulatory Visit: Admitting: Urology
